# Patient Record
Sex: Female | Born: 1995 | Race: Black or African American | Hispanic: No | Marital: Single | Smoking: Former smoker
Health system: Southern US, Community
[De-identification: ages and names within clinical notes are randomized; demographics above are authoritative.]

## PROBLEM LIST (undated history)

## (undated) ENCOUNTER — Inpatient Hospital Stay (HOSPITAL_COMMUNITY): Payer: Self-pay

## (undated) ENCOUNTER — Emergency Department (HOSPITAL_COMMUNITY): Admission: EM

## (undated) DIAGNOSIS — D649 Anemia, unspecified: Secondary | ICD-10-CM

## (undated) DIAGNOSIS — E611 Iron deficiency: Secondary | ICD-10-CM

## (undated) HISTORY — PX: NO PAST SURGERIES: SHX2092

---
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
## 2014-01-06 ENCOUNTER — Encounter (HOSPITAL_COMMUNITY): Payer: Self-pay | Admitting: *Deleted

## 2014-02-11 ENCOUNTER — Emergency Department (HOSPITAL_COMMUNITY)
Admission: EM | Admit: 2014-02-11 | Discharge: 2014-02-12 | Disposition: A | Discharge status: Home / Self Care | Payer: Medicaid Other | Attending: Emergency Medicine | Admitting: Emergency Medicine

## 2014-02-11 ENCOUNTER — Emergency Department (HOSPITAL_COMMUNITY): Payer: Medicaid Other

## 2014-02-11 ENCOUNTER — Encounter (HOSPITAL_COMMUNITY): Payer: Self-pay

## 2014-02-11 DIAGNOSIS — R109 Unspecified abdominal pain: Secondary | ICD-10-CM | POA: Insufficient documentation

## 2014-02-11 DIAGNOSIS — Z720 Tobacco use: Secondary | ICD-10-CM | POA: Diagnosis not present

## 2014-02-11 DIAGNOSIS — Z3202 Encounter for pregnancy test, result negative: Secondary | ICD-10-CM | POA: Diagnosis not present

## 2014-02-11 DIAGNOSIS — Z7952 Long term (current) use of systemic steroids: Secondary | ICD-10-CM | POA: Diagnosis not present

## 2014-02-11 DIAGNOSIS — Z792 Long term (current) use of antibiotics: Secondary | ICD-10-CM | POA: Diagnosis not present

## 2014-02-11 DIAGNOSIS — Z79899 Other long term (current) drug therapy: Secondary | ICD-10-CM | POA: Diagnosis not present

## 2014-02-11 LAB — COMPREHENSIVE METABOLIC PANEL
ALT: 7 U/L (ref 0–35)
AST: 12 U/L (ref 0–37)
Albumin: 4.2 g/dL (ref 3.5–5.2)
Alkaline Phosphatase: 61 U/L (ref 39–117)
Anion gap: 13 (ref 5–15)
BILIRUBIN TOTAL: 0.3 mg/dL (ref 0.3–1.2)
BUN: 15 mg/dL (ref 6–23)
CHLORIDE: 99 meq/L (ref 96–112)
CO2: 25 meq/L (ref 19–32)
Calcium: 10 mg/dL (ref 8.4–10.5)
Creatinine, Ser: 1.13 mg/dL — ABNORMAL HIGH (ref 0.50–1.10)
GFR calc Af Amer: 81 mL/min — ABNORMAL LOW (ref >90)
GFR, EST NON AFRICAN AMERICAN: 70 mL/min — AB (ref >90)
Glucose, Bld: 96 mg/dL (ref 70–99)
Potassium: 3.9 mEq/L (ref 3.7–5.3)
SODIUM: 137 meq/L (ref 137–147)
Total Protein: 7.7 g/dL (ref 6.0–8.3)

## 2014-02-11 LAB — CBC WITH DIFFERENTIAL/PLATELET
BASOS ABS: 0 10*3/uL (ref 0.0–0.1)
Basophils Relative: 0 % (ref 0–1)
Eosinophils Absolute: 0.1 10*3/uL (ref 0.0–0.7)
Eosinophils Relative: 4 % (ref 0–5)
HEMATOCRIT: 36.7 % (ref 36.0–46.0)
Hemoglobin: 12 g/dL (ref 12.0–15.0)
LYMPHS ABS: 2 10*3/uL (ref 0.7–4.0)
LYMPHS PCT: 52 % — AB (ref 12–46)
MCH: 27.3 pg (ref 26.0–34.0)
MCHC: 32.7 g/dL (ref 30.0–36.0)
MCV: 83.6 fL (ref 78.0–100.0)
MONO ABS: 0.1 10*3/uL (ref 0.1–1.0)
Monocytes Relative: 4 % (ref 3–12)
NEUTROS ABS: 1.5 10*3/uL — AB (ref 1.7–7.7)
Neutrophils Relative %: 40 % — ABNORMAL LOW (ref 43–77)
PLATELETS: 231 10*3/uL (ref 150–400)
RBC: 4.39 MIL/uL (ref 3.87–5.11)
RDW: 12.1 % (ref 11.5–15.5)
WBC: 3.8 10*3/uL — AB (ref 4.0–10.5)

## 2014-02-11 LAB — URINALYSIS, ROUTINE W REFLEX MICROSCOPIC
BILIRUBIN URINE: NEGATIVE
GLUCOSE, UA: NEGATIVE mg/dL
HGB URINE DIPSTICK: NEGATIVE
Ketones, ur: NEGATIVE mg/dL
Leukocytes, UA: NEGATIVE
Nitrite: NEGATIVE
PH: 7 (ref 5.0–8.0)
Protein, ur: NEGATIVE mg/dL
SPECIFIC GRAVITY, URINE: 1.028 (ref 1.005–1.030)
Urobilinogen, UA: 2 mg/dL — ABNORMAL HIGH (ref 0.0–1.0)

## 2014-02-11 LAB — POC URINE PREG, ED: Preg Test, Ur: NEGATIVE

## 2014-02-11 LAB — LIPASE, BLOOD: Lipase: 38 U/L (ref 11–59)

## 2014-02-11 NOTE — ED Notes (Signed)
Pt complains of lower abd pain since September, mom states that she is gaining weight

## 2014-02-11 NOTE — ED Provider Notes (Signed)
CSN: [id number]     Arrival date & time 02/11/14  2117 History   First MD Initiated Contact with Patient 02/11/14 2129     Chief Complaint  Patient presents with  . Abdominal Pain     (Consider location/radiation/quality/duration/timing/severity/associated sxs/prior Treatment) Patient is a 18 y.o. female presenting with abdominal pain. The history is provided by the patient and a relative.  Abdominal Pain Pain location:  Suprapubic Pain quality: aching   Pain radiates to:  Back Pain severity:  Mild Onset quality:  Gradual Duration:  16 weeks Timing:  Sporadic Progression:  Unchanged Chronicity:  New Context: not alcohol use, not awakening from sleep and not trauma   Relieved by:  Nothing Worsened by:  Nothing tried Associated symptoms: no chills, no cough, no fever and no shortness of breath     History reviewed. No pertinent past medical history. History reviewed. No pertinent past surgical history. History reviewed. No pertinent family history. History  Substance Use Topics  . Smoking status: Current Some Day Smoker  . Smokeless tobacco: Never Used  . Alcohol Use: No   OB History    Gravida Para Term Preterm AB TAB SAB Ectopic Multiple Living   1 1 1       1      Review of Systems  Constitutional: Negative for fever and chills.  Respiratory: Negative for cough and shortness of breath.   Gastrointestinal: Positive for abdominal pain.  All other systems reviewed and are negative.     Allergies  Review of patient's allergies indicates no known allergies.  Home Medications   Prior to Admission medications   Medication Sig Start Date End Date Taking? Authorizing Provider  medroxyPROGESTERone (DEPO-PROVERA) 150 MG/ML injection Inject 150 mg into the muscle every 3 (three) months.   Yes Historical Provider, MD  acyclovir (ZOVIRAX) 400 MG tablet Take 1 tablet (400 mg total) by mouth 4 (four) times daily. Patient not taking: Reported on 02/11/2014 06/29/13   Elson AreasLeslie K  Sofia, PA-C  amoxicillin (AMOXIL) 400 MG/5ML suspension 12.5 mL TID Patient not taking: Reported on 02/11/2014 05/01/11   Reuben Likesavid C Keller, MD  cephALEXin (KEFLEX) 500 MG capsule Take 1 capsule (500 mg total) by mouth 3 (three) times daily. Patient not taking: Reported on 02/11/2014 07/09/12   Reuben Likesavid C Keller, MD  hydrOXYzine (ATARAX/VISTARIL) 25 MG tablet Take 1 tablet (25 mg total) by mouth every 6 (six) hours. Patient not taking: Reported on 02/11/2014 07/09/12   Reuben Likesavid C Keller, MD  metroNIDAZOLE (FLAGYL) 500 MG tablet Take 1 tablet (500 mg total) by mouth 2 (two) times daily. Patient not taking: Reported on 02/11/2014 07/03/13   Elson AreasLeslie K Sofia, PA-C  predniSONE (DELTASONE) 20 MG tablet Take 1 tablet (20 mg total) by mouth 2 (two) times daily. Patient not taking: Reported on 02/11/2014 07/09/12   Reuben Likesavid C Keller, MD   BP 117/74 mmHg  Pulse 78  Temp(Src) 97.8 F (36.6 C) (Oral)  Resp 20  Ht 5\' 5"  (1.651 m)  Wt 141 lb (63.957 kg)  BMI 23.46 kg/m2  SpO2 100% Physical Exam  Constitutional: She is oriented to person, place, and time. She appears well-developed and well-nourished. No distress.  HENT:  Head: Normocephalic and atraumatic.  Mouth/Throat: Oropharynx is clear and moist. No oropharyngeal exudate.  Eyes: EOM are normal. Pupils are equal, round, and reactive to light.  Neck: Normal range of motion. Neck supple.  Cardiovascular: Normal rate and regular rhythm.  Exam reveals no friction rub.   No murmur heard.  Pulmonary/Chest: Effort normal and breath sounds normal. No respiratory distress. She has no wheezes. She has no rales.  Abdominal: Soft. She exhibits no distension. There is no tenderness. There is no rebound.  Musculoskeletal: Normal range of motion. She exhibits no edema.  Neurological: She is alert and oriented to person, place, and time.  Skin: No rash noted. She is not diaphoretic.  Nursing note and vitals reviewed.   ED Course  Procedures (including critical care time) Labs  Review Labs Reviewed  URINALYSIS, ROUTINE W REFLEX MICROSCOPIC - Abnormal; Notable for the following:    Urobilinogen, UA 2.0 (*)    All other components within normal limits  CBC WITH DIFFERENTIAL  COMPREHENSIVE METABOLIC PANEL  LIPASE, BLOOD  POC URINE PREG, ED    Imaging Review [place] Transvaginal Non-ob  02/11/2014   CLINICAL DATA:  Abdominal and pelvic pain  EXAM: TRANSABDOMINAL AND TRANSVAGINAL ULTRASOUND OF PELVIS  TECHNIQUE: Both transabdominal and transvaginal ultrasound examinations of the pelvis were performed. Transabdominal technique was performed for global imaging of the pelvis including uterus, ovaries, adnexal regions, and pelvic cul-de-sac. It was necessary to proceed with endovaginal exam following the transabdominal exam to visualize the uterus, endometrium, and ovaries.  COMPARISON:  None  FINDINGS: Uterus  Measurements: 5.9 x 3.4 x 4.4 cm. Heterogeneous myometrial echogenicity without focal mass.  Endometrium  Thickness: 6 mm thick, normal. No endometrial fluid or focal abnormality.  Right ovary  Measurements: 3.0 x 2.4 x 2.1 cm. Normal morphology without mass. Internal blood flow present on color Doppler imaging.  Left ovary  Measurements: 3.4 x 2.1 x 2.4 cm. Normal morphology without mass. Internal blood flow present on color Doppler imaging.  Other findings  No adnexal masses or free pelvic fluid.  IMPRESSION: Normal exam.   Electronically Signed   By: Ulyses SouthwardMark  Boles M.D.   On: 02/11/2014 23:47   [place] Pelvis Complete  02/11/2014   CLINICAL DATA:  Abdominal and pelvic pain  EXAM: TRANSABDOMINAL AND TRANSVAGINAL ULTRASOUND OF PELVIS  TECHNIQUE: Both transabdominal and transvaginal ultrasound examinations of the pelvis were performed. Transabdominal technique was performed for global imaging of the pelvis including uterus, ovaries, adnexal regions, and pelvic cul-de-sac. It was necessary to proceed with endovaginal exam following the transabdominal exam to visualize the uterus,  endometrium, and ovaries.  COMPARISON:  None  FINDINGS: Uterus  Measurements: 5.9 x 3.4 x 4.4 cm. Heterogeneous myometrial echogenicity without focal mass.  Endometrium  Thickness: 6 mm thick, normal. No endometrial fluid or focal abnormality.  Right ovary  Measurements: 3.0 x 2.4 x 2.1 cm. Normal morphology without mass. Internal blood flow present on color Doppler imaging.  Left ovary  Measurements: 3.4 x 2.1 x 2.4 cm. Normal morphology without mass. Internal blood flow present on color Doppler imaging.  Other findings  No adnexal masses or free pelvic fluid.  IMPRESSION: Normal exam.   Electronically Signed   By: Ulyses SouthwardMark  Boles M.D.   On: 02/11/2014 23:47     EKG Interpretation None      MDM   Final diagnoses:  Abdominal pain    12F here with abdominal pain. Not daily, about every other day for a few hours, lower abdomen and in her lower back. No vaginal bleeding or discharge. No urinary symptoms. Last sexual activity about 4 months ago. This began about 4 months ago. Denies any vaginal discharge or history of STD. On exam belly is benign. Lower back without any tenderness. Urine is negative pregnancy is negative. Patient deferred a pelvic refused  to have me perform one. She requested an ultrasound, do not think this is reasonable. Will order to see if she has possible ovarian cyst. On deposition for 3-4 years, has occasional spotting but no full-fledged periods. No correlation between spotting and pain. [place] normal. Stable for discharge.    Elwin Mocha, MD 02/12/14 [phone]

## 2014-02-12 MED ORDER — IBUPROFEN 800 MG PO TABS: 800.0000 mg | ORAL_TABLET | Freq: Three times a day (TID) | ORAL | Status: DC

## 2014-02-12 NOTE — Discharge Instructions (Signed)
Abdominal Pain, Women °Abdominal (stomach, pelvic, or belly) pain can be caused by many things. It is important to tell your doctor: °· The location of the pain. °· Does it come and go or is it present all the time? °· Are there things that start the pain (eating certain foods, exercise)? °· Are there other symptoms associated with the pain (fever, nausea, vomiting, diarrhea)? °All of this is helpful to know when trying to find the cause of the pain. °CAUSES  °· Stomach: virus or bacteria infection, or ulcer. °· Intestine: appendicitis (inflamed appendix), regional ileitis (Crohn's disease), ulcerative colitis (inflamed colon), irritable bowel syndrome, diverticulitis (inflamed diverticulum of the colon), or cancer of the stomach or intestine. °· Gallbladder disease or stones in the gallbladder. °· Kidney disease, kidney stones, or infection. °· Pancreas infection or cancer. °· Fibromyalgia (pain disorder). °· Diseases of the female organs: °¨ Uterus: fibroid (non-cancerous) tumors or infection. °¨ Fallopian tubes: infection or tubal pregnancy. °¨ Ovary: cysts or tumors. °¨ Pelvic adhesions (scar tissue). °¨ Endometriosis (uterus lining tissue growing in the pelvis and on the pelvic organs). °¨ Pelvic congestion syndrome (female organs filling up with blood just before the menstrual period). °¨ Pain with the menstrual period. °¨ Pain with ovulation (producing an egg). °¨ Pain with an IUD (intrauterine device, birth control) in the uterus. °¨ Cancer of the female organs. °· Functional pain (pain not caused by a disease, may improve without treatment). °· Psychological pain. °· Depression. °DIAGNOSIS  °Your doctor will decide the seriousness of your pain by doing an examination. °· Blood tests. °· X-rays. °· Ultrasound. °· CT scan (computed tomography, special type of X-ray). °· MRI (magnetic resonance imaging). °· Cultures, for infection. °· Barium enema (dye inserted in the large intestine, to better view it with  X-rays). °· Colonoscopy (looking in intestine with a lighted tube). °· Laparoscopy (minor surgery, looking in abdomen with a lighted tube). °· Major abdominal exploratory surgery (looking in abdomen with a large incision). °TREATMENT  °The treatment will depend on the cause of the pain.  °· Many cases can be observed and treated at home. °· Over-the-counter medicines recommended by your caregiver. °· Prescription medicine. °· Antibiotics, for infection. °· Birth control pills, for painful periods or for ovulation pain. °· Hormone treatment, for endometriosis. °· Nerve blocking injections. °· Physical therapy. °· Antidepressants. °· Counseling with a psychologist or psychiatrist. °· Minor or major surgery. °HOME CARE INSTRUCTIONS  °· Do not take laxatives, unless directed by your caregiver. °· Take over-the-counter pain medicine only if ordered by your caregiver. Do not take aspirin because it can cause an upset stomach or bleeding. °· Try a clear liquid diet (broth or water) as ordered by your caregiver. Slowly move to a bland diet, as tolerated, if the pain is related to the stomach or intestine. °· Have a thermometer and take your temperature several times a day, and record it. °· Bed rest and sleep, if it helps the pain. °· Avoid sexual intercourse, if it causes pain. °· Avoid stressful situations. °· Keep your follow-up appointments and tests, as your caregiver orders. °· If the pain does not go away with medicine or surgery, you may try: °¨ Acupuncture. °¨ Relaxation exercises (yoga, meditation). °¨ Group therapy. °¨ Counseling. °SEEK MEDICAL CARE IF:  °· You notice certain foods cause stomach pain. °· Your home care treatment is not helping your pain. °· You need stronger pain medicine. °· You want your IUD removed. °· You feel faint or   lightheaded. °· You develop nausea and vomiting. °· You develop a rash. °· You are having side effects or an allergy to your medicine. °SEEK IMMEDIATE MEDICAL CARE IF:  °· Your  pain does not go away or gets worse. °· You have a fever. °· Your pain is felt only in portions of the abdomen. The right side could possibly be appendicitis. The left lower portion of the abdomen could be colitis or diverticulitis. °· You are passing blood in your stools (bright red or black tarry stools, with or without vomiting). °· You have blood in your urine. °· You develop chills, with or without a fever. °· You pass out. °MAKE SURE YOU:  °· Understand these instructions. °· Will watch your condition. °· Will get help right away if you are not doing well or get worse. °Document Released: 12/19/2006 Document Revised: 07/08/2013 Document Reviewed: 01/08/2009 °[employer]® Patient Information ©2015 [employer]. This information is not intended to replace advice given to you by your health care provider. Make sure you discuss any questions you have with your health care provider. ° °

## 2014-03-27 ENCOUNTER — Emergency Department (HOSPITAL_COMMUNITY)
Admission: EM | Admit: 2014-03-27 | Discharge: 2014-03-27 | Disposition: A | Discharge status: Home / Self Care | Payer: Medicaid Other | Attending: Emergency Medicine | Admitting: Emergency Medicine

## 2014-03-27 ENCOUNTER — Encounter (HOSPITAL_COMMUNITY): Payer: Self-pay | Admitting: *Deleted

## 2014-03-27 DIAGNOSIS — K068 Other specified disorders of gingiva and edentulous alveolar ridge: Secondary | ICD-10-CM

## 2014-03-27 DIAGNOSIS — Z3202 Encounter for pregnancy test, result negative: Secondary | ICD-10-CM | POA: Insufficient documentation

## 2014-03-27 DIAGNOSIS — N72 Inflammatory disease of cervix uteri: Secondary | ICD-10-CM

## 2014-03-27 DIAGNOSIS — Z79899 Other long term (current) drug therapy: Secondary | ICD-10-CM | POA: Insufficient documentation

## 2014-03-27 DIAGNOSIS — K088 Other specified disorders of teeth and supporting structures: Secondary | ICD-10-CM | POA: Diagnosis not present

## 2014-03-27 DIAGNOSIS — R102 Pelvic and perineal pain: Secondary | ICD-10-CM | POA: Insufficient documentation

## 2014-03-27 DIAGNOSIS — Z7952 Long term (current) use of systemic steroids: Secondary | ICD-10-CM | POA: Diagnosis not present

## 2014-03-27 DIAGNOSIS — Z792 Long term (current) use of antibiotics: Secondary | ICD-10-CM | POA: Insufficient documentation

## 2014-03-27 LAB — URINALYSIS, ROUTINE W REFLEX MICROSCOPIC
Bilirubin Urine: NEGATIVE
GLUCOSE, UA: NEGATIVE mg/dL
HGB URINE DIPSTICK: NEGATIVE
KETONES UR: NEGATIVE mg/dL
Leukocytes, UA: NEGATIVE
Nitrite: NEGATIVE
PROTEIN: NEGATIVE mg/dL
Specific Gravity, Urine: 1.035 — ABNORMAL HIGH (ref 1.005–1.030)
UROBILINOGEN UA: 1 mg/dL (ref 0.0–1.0)
pH: 6 (ref 5.0–8.0)

## 2014-03-27 LAB — WET PREP, GENITAL
CLUE CELLS WET PREP: NONE SEEN
Trich, Wet Prep: NONE SEEN
YEAST WET PREP: NONE SEEN

## 2014-03-27 LAB — PREGNANCY, URINE: PREG TEST UR: NEGATIVE

## 2014-03-27 MED ORDER — CEFTRIAXONE SODIUM 250 MG IJ SOLR
250.0000 mg | Freq: Once | INTRAMUSCULAR | Status: AC
  Administered 2014-03-27: 250 mg via INTRAMUSCULAR
  Filled 2014-03-27: qty 250

## 2014-03-27 MED ORDER — IBUPROFEN 400 MG PO TABS
600.0000 mg | ORAL_TABLET | Freq: Once | ORAL | Status: AC
  Administered 2014-03-27: 600 mg via ORAL
  Filled 2014-03-27 (×2): qty 1

## 2014-03-27 MED ORDER — LIDOCAINE HCL (PF) 1 % IJ SOLN
INTRAMUSCULAR | Status: AC
  Administered 2014-03-27: 5 mL
  Filled 2014-03-27: qty 5

## 2014-03-27 MED ORDER — AZITHROMYCIN 250 MG PO TABS
1000.0000 mg | ORAL_TABLET | Freq: Once | ORAL | Status: AC
  Administered 2014-03-27: 1000 mg via ORAL
  Filled 2014-03-27: qty 4

## 2014-03-27 NOTE — ED Notes (Signed)
Pt in c/o gum swelling and pain, also concerned she may be pregnant, no distress noted

## 2014-03-27 NOTE — Discharge Instructions (Signed)
Take ibuprofen or tylenol for pain. Follow up with your doctor or ob/gyn regarding your pelvic pain. Listerine for gum pain several times a day.   Pelvic Pain Female pelvic pain can be caused by many different things and start from a variety of places. Pelvic pain refers to pain that is located in the lower half of the abdomen and between your hips. The pain may occur over a short period of time (acute) or may be reoccurring (chronic). The cause of pelvic pain may be related to disorders affecting the female reproductive organs (gynecologic), but it may also be related to the bladder, kidney stones, an intestinal complication, or muscle or skeletal problems. Getting help right away for pelvic pain is important, especially if there has been severe, sharp, or a sudden onset of unusual pain. It is also important to get help right away because some types of pelvic pain can be life threatening.  CAUSES  Below are only some of the causes of pelvic pain. The causes of pelvic pain can be in one of several categories.   Gynecologic.  Pelvic inflammatory disease.  Sexually transmitted infection.  Ovarian cyst or a twisted ovarian ligament (ovarian torsion).  Uterine lining that grows outside the uterus (endometriosis).  Fibroids, cysts, or tumors.  Ovulation.  Pregnancy.  Pregnancy that occurs outside the uterus (ectopic pregnancy).  Miscarriage.  Labor.  Abruption of the placenta or ruptured uterus.  Infection.  Uterine infection (endometritis).  Bladder infection.  Diverticulitis.  Miscarriage related to a uterine infection (septic abortion).  Bladder.  Inflammation of the bladder (cystitis).  Kidney stone(s).  Gastrointestinal.  Constipation.  Diverticulitis.  Neurologic.  Trauma.  Feeling pelvic pain because of mental or emotional causes (psychosomatic).  Cancers of the bowel or pelvis. EVALUATION  Your caregiver will want to take a careful history of your  concerns. This includes recent changes in your health, a careful gynecologic history of your periods (menses), and a sexual history. Obtaining your family history and medical history is also important. Your caregiver may suggest a pelvic exam. A pelvic exam will help identify the location and severity of the pain. It also helps in the evaluation of which organ system may be involved. In order to identify the cause of the pelvic pain and be properly treated, your caregiver may order tests. These tests may include:   A pregnancy test.  Pelvic ultrasonography.  An X-ray exam of the abdomen.  A urinalysis or evaluation of vaginal discharge.  Blood tests. HOME CARE INSTRUCTIONS   Only take over-the-counter or prescription medicines for pain, discomfort, or fever as directed by your caregiver.   Rest as directed by your caregiver.   Eat a balanced diet.   Drink enough fluids to make your urine clear or pale yellow, or as directed.   Avoid sexual intercourse if it causes pain.   Apply warm or cold compresses to the lower abdomen depending on which one helps the pain.   Avoid stressful situations.   Keep a journal of your pelvic pain. Write down when it started, where the pain is located, and if there are things that seem to be associated with the pain, such as food or your menstrual cycle.  Follow up with your caregiver as directed.  SEEK MEDICAL CARE IF:  Your medicine does not help your pain.  You have abnormal vaginal discharge. SEEK IMMEDIATE MEDICAL CARE IF:   You have heavy bleeding from the vagina.   Your pelvic pain increases.   You  feel light-headed or faint.   You have chills.   You have pain with urination or blood in your urine.   You have uncontrolled diarrhea or vomiting.   You have a fever or persistent symptoms for more than 3 days.  You have a fever and your symptoms suddenly get worse.   You are being physically or sexually abused.   MAKE SURE YOU:  Understand these instructions.  Will watch your condition.  Will get help if you are not doing well or get worse. Document Released: 01/19/2004 Document Revised: 07/08/2013 Document Reviewed: 06/13/2011 [facility] Patient Information 2015 [place], [place]. This information is not intended to replace advice given to you by your health care provider. Make sure you discuss any questions you have with your health care provider.

## 2014-03-27 NOTE — ED Provider Notes (Signed)
CSN: [id number]     Arrival date &  time 03/27/14  1409 History   First MD Initiated Contact with Patient 03/27/14 1515     Chief Complaint  Patient presents with  . Dental Pain  . Possible Pregnancy     (Consider location/radiation/quality/duration/timing/severity/associated sxs/prior Treatment) HPI Christy Gonzalez is a 19 y.o. female presents to ED with complaint of gum swelling and lower abdominal pain. Patient states gum swelling started several days ago. States she has a cap on that tooth. Denies any pain in that tooth, no pain with eating or biting. No fever, chills, facial swelling. No swelling under the tongue. Patient is also complaining of lower abdominal pain for 3 months. She states when pain started she was seen in emergency department and by her doctor. She was not given a diagnosis. She states she thought she may do been pregnant, but states her pregnancy test that time was negative. She did not take pregnancy test at home prior to coming in. She reports some abnormal vaginal discharge, states white. Denies bleeding. Denies any nausea, vomiting, changes in bowels. No medications prior to coming in.  History reviewed. No pertinent past medical history. History reviewed. No pertinent past surgical history. History reviewed. No pertinent family history. History  Substance Use Topics  . Smoking status: Current Some Day Smoker  . Smokeless tobacco: Never Used  . Alcohol Use: No   OB History    Gravida Para Term Preterm AB TAB SAB Ectopic Multiple Living   Review of Systems  Constitutional: Negative for fever and chills.  HENT: Positive for dental problem.   Respiratory: Negative for cough, chest tightness and shortness of breath.   Cardiovascular: Negative for chest pain, palpitations and leg swelling.  Gastrointestinal: Positive for abdominal pain. Negative for nausea, vomiting and diarrhea.  Genitourinary: Positive for vaginal discharge and pelvic  pain. Negative for dysuria, flank pain, vaginal bleeding and vaginal pain.  Musculoskeletal: Negative for myalgias, arthralgias, neck pain and neck stiffness.  Skin: Negative for rash.  Neurological: Negative for dizziness, weakness and headaches.  All other systems reviewed and are negative.     Allergies  Review of patient's allergies indicates no known allergies.  Home Medications   Prior to Admission medications   Medication Sig Start Date End Date Taking? Authorizing Provider  ibuprofen (ADVIL,MOTRIN) 800 MG tablet Take 1 tablet (800 mg total) by mouth 3 (three) times daily. 02/12/14  Yes Elwin Mocha, MD  medroxyPROGESTERone (DEPO-PROVERA) 150 MG/ML injection Inject 150 mg into the muscle every 3 (three) months.   Yes Historical Provider, MD  acyclovir (ZOVIRAX) 400 MG tablet Take 1 tablet (400 mg total) by mouth 4 (four) times daily. Patient not taking: Reported on 02/11/2014 06/29/13   Elson Areas, PA-C  amoxicillin (AMOXIL) 400 MG/5ML suspension 12.5 mL TID Patient not taking: Reported on 02/11/2014 05/01/11   Reuben Likes, MD  cephALEXin (KEFLEX) 500 MG capsule Take 1 capsule (500 mg total) by mouth 3 (three) times daily. Patient not taking: Reported on 02/11/2014 07/09/12   Reuben Likes, MD  hydrOXYzine (ATARAX/VISTARIL) 25 MG tablet Take 1 tablet (25 mg total) by mouth every 6 (six) hours. Patient not taking: Reported on 02/11/2014 07/09/12   Reuben Likes, MD  metroNIDAZOLE (FLAGYL) 500 MG tablet Take 1 tablet (500 mg total) by mouth 2 (two) times daily. Patient not taking: Reported on 02/11/2014 07/03/13   Lonia Skinner  Sofia, PA-C  predniSONE (DELTASONE) 20 MG tablet Take 1 tablet (20 mg total) by mouth 2 (two) times daily. Patient not taking: Reported on 02/11/2014 07/09/12   Reuben Likesavid C Keller, MD   BP 135/72 mmHg  Pulse 79  Temp(Src) 98 F (36.7 C) (Oral)  Resp 20  SpO2 100%  LMP  (LMP Unknown) Physical Exam  Constitutional: She appears well-developed and well-nourished. No  distress.  HENT:  Head: Normocephalic.  Dental crown noted on the left lower first molar. There is no obvious gum swelling around the tooth. There is no tenderness to palpation.  Eyes: Conjunctivae are normal.  Neck: Neck supple.  Cardiovascular: Normal rate, regular rhythm and normal heart sounds.   Pulmonary/Chest: Effort normal and breath sounds normal. No respiratory distress. She has no wheezes. She has no rales.  Abdominal: Soft. Bowel sounds are normal. She exhibits no distension. There is no tenderness. There is no rebound and no guarding.  Genitourinary:  Normal external genitalia. Normal vaginal canal. Small thin white discharge. Cervix is erythematous, friable. closed. + CMT. No uterine or adnexal tenderness. No masses palpated.    Musculoskeletal: She exhibits no edema.  Neurological: She is alert.  Skin: Skin is warm and dry.  Psychiatric: She has a normal mood and affect. Her behavior is normal.  Nursing note and vitals reviewed.   ED Course  Procedures (including critical care time) Labs Review Labs Reviewed  WET PREP, GENITAL  URINALYSIS, ROUTINE W REFLEX MICROSCOPIC  PREGNANCY, URINE  GC/CHLAMYDIA PROBE AMP ([place])    Imaging Review No results found.   EKG Interpretation None      MDM   Final diagnoses:  None    Patient emergency department complaining of lower abdominal pain for 4 months, states she believes she is pregnant despite multiple pregnancy tests by her doctor and last month and 3 months ago here. She states she is also having some breast swelling and tenderness. She is on Depo shot. Will get pelvic exam, ua, preg test.    4:58 PM Urine pregnancy is negative. UA unremarkable. Pelvic exam showing moderate white blood cells on the wet prep, suspect given her erythematous and friable cervix with positive cervical motion tenderness that she may have cervicitis. Her abdominal exam is benign  otherwise. Will treat with Rocephin and Zithromax  in the emergency department.  when I discussed discharge instructions and plan with patient, she became very upset stating that we did not figure was going on with her stomach. She also explained to me that she strongly believes that she is pregnant. I do not think that she needs any further imaging at this time. I do not think she has a surgical abdomen, there is no guarding or even tenderness over abdominal area. Her pain has been there for 4 months. Her symptoms could be related to intestinal cramping, ovarian cysts, pelvic pain. I discussed with her that she needs to follow with her primary care doctor or OB/GYN specialist for further evaluation and treatment. Motrin given for pain,.   Filed Vitals:   03/27/14 1414  BP: 135/72  Pulse: 79  Temp: 98 F (36.7 C)  TempSrc: Oral  Resp: 20  SpO2: 100%       Myriam Jacobsonatyana A Beautifull Cisar, PA-C 03/27/14 1720  Samuel JesterKathleen McManus, DO 03/30/14 1332

## 2014-03-28 LAB — GC/CHLAMYDIA PROBE AMP (~~LOC~~) NOT AT ARMC
CHLAMYDIA, DNA PROBE: NEGATIVE
NEISSERIA GONORRHEA: NEGATIVE

## 2014-05-22 ENCOUNTER — Encounter (HOSPITAL_COMMUNITY): Payer: Self-pay | Admitting: Emergency Medicine

## 2014-05-22 ENCOUNTER — Emergency Department (HOSPITAL_COMMUNITY)
Admission: EM | Admit: 2014-05-22 | Discharge: 2014-05-22 | Disposition: A | Discharge status: Home / Self Care | Payer: Medicaid Other | Attending: Emergency Medicine | Admitting: Emergency Medicine

## 2014-05-22 DIAGNOSIS — M545 Low back pain, unspecified: Secondary | ICD-10-CM

## 2014-05-22 DIAGNOSIS — Y92410 Unspecified street and highway as the place of occurrence of the external cause: Secondary | ICD-10-CM | POA: Insufficient documentation

## 2014-05-22 DIAGNOSIS — S3992XA Unspecified injury of lower back, initial encounter: Secondary | ICD-10-CM | POA: Insufficient documentation

## 2014-05-22 DIAGNOSIS — Y9389 Activity, other specified: Secondary | ICD-10-CM | POA: Insufficient documentation

## 2014-05-22 DIAGNOSIS — Z3202 Encounter for pregnancy test, result negative: Secondary | ICD-10-CM | POA: Insufficient documentation

## 2014-05-22 DIAGNOSIS — Y998 Other external cause status: Secondary | ICD-10-CM | POA: Diagnosis not present

## 2014-05-22 DIAGNOSIS — Z720 Tobacco use: Secondary | ICD-10-CM | POA: Insufficient documentation

## 2014-05-22 LAB — POC URINE PREG, ED: PREG TEST UR: NEGATIVE

## 2014-05-22 MED ORDER — ACETAMINOPHEN 325 MG PO TABS
650.0000 mg | ORAL_TABLET | Freq: Once | ORAL | Status: AC
  Administered 2014-05-22: 650 mg via ORAL
  Filled 2014-05-22: qty 2

## 2014-05-22 MED ORDER — NAPROXEN 500 MG PO TABS: 500.0000 mg | ORAL_TABLET | Freq: Two times a day (BID) | ORAL | Status: DC
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
## 2014-05-22 NOTE — Discharge Instructions (Signed)
Back Pain, Adult °Low back pain is very common. About 1 in 5 people have back pain. The cause of low back pain is rarely dangerous. The pain often gets better over time. About half of people with a sudden onset of back pain feel better in just 2 weeks. About 8 in 10 people feel better by 6 weeks.  °CAUSES °Some common causes of back pain include: °· Strain of the muscles or ligaments supporting the spine. °· Wear and tear (degeneration) of the spinal discs. °· Arthritis. °· Direct injury to the back. °DIAGNOSIS °Most of the time, the direct cause of low back pain is not known. However, back pain can be treated effectively even when the exact cause of the pain is unknown. Answering your caregiver's questions about your overall health and symptoms is one of the most accurate ways to make sure the cause of your pain is not dangerous. If your caregiver needs more information, he or she may order lab work or imaging tests (X-rays or MRIs). However, even if imaging tests show changes in your back, this usually does not require surgery. °HOME CARE INSTRUCTIONS °For many people, back pain returns. Since low back pain is rarely dangerous, it is often a condition that people can learn to manage on their own.  °· Remain active. It is stressful on the back to sit or stand in one place. Do not sit, drive, or stand in one place for more than 30 minutes at a time. Take short walks on level surfaces as soon as pain allows. Try to increase the length of time you walk each day. °· Do not stay in bed. Resting more than 1 or 2 days can delay your recovery. °· Do not avoid exercise or work. Your body is made to move. It is not dangerous to be active, even though your back may hurt. Your back will likely heal faster if you return to being active before your pain is gone. °· Pay attention to your body when you  bend and lift. Many people have less discomfort when lifting if they bend their knees, keep the load close to their bodies, and  avoid twisting. Often, the most comfortable positions are those that put less stress on your recovering back. °· Find a comfortable position to sleep. Use a firm mattress and lie on your side with your knees slightly bent. If you lie on your back, put a pillow under your knees. °· Only take over-the-counter or prescription medicines as directed by your caregiver. Over-the-counter medicines to reduce pain and inflammation are often the most helpful. Your caregiver may prescribe muscle relaxant drugs. These medicines help dull your pain so you can more quickly return to your normal activities and healthy exercise. °· Put ice on the injured area. °· Put ice in a plastic bag. °· Place a towel between your skin and the bag. °· Leave the ice on for 15-20 minutes, 03-04 times a day for the first 2 to 3 days. After that, ice and heat may be alternated to reduce pain and spasms. °· Ask your caregiver about trying back exercises and gentle massage. This may be of some benefit. °· Avoid feeling anxious or stressed. Stress increases muscle tension and can worsen back pain. It is important to recognize when you are anxious or stressed and learn ways to manage it. Exercise is a great option. °SEEK MEDICAL CARE IF: °· You have pain that is not relieved with rest or medicine. °· You have pain that does not improve in 1 week. °· You have new symptoms. °· You are generally not feeling well. °SEEK   IMMEDIATE MEDICAL CARE IF:  °· You have pain that radiates from your back into your legs. °· You develop new bowel or bladder control problems. °· You have unusual weakness or numbness in your arms or legs. °· You develop nausea or vomiting. °· You develop abdominal pain. °· You feel faint. °Document Released: 02/21/2005 Document Revised: 08/23/2011 Document Reviewed: 06/25/2013 °Exit[employer]® Patient Information ©2015 [employer]. This information is not intended to replace advice given to you by your health care provider. Make sure you  discuss any questions you have with your health care provider. ° °Back Exercises °Back exercises help treat and prevent back injuries. The goal of back exercises is to increase the strength of your abdominal and back muscles and the flexibility of your back. These exercises should be started when you no longer have back pain. Back exercises include: °· Pelvic Tilt. Lie on your back with your knees bent. Tilt your pelvis until the lower part of your back is against the floor. Hold this position 5 to 10 sec and repeat 5 to 10 times. °· Knee to Chest. Pull first 1 knee up against your chest and hold for 20 to 30 seconds, repeat this with the other knee, and then both knees. This may be done with the other leg straight or bent, whichever feels better. °· Sit-Ups or Curl-Ups. Bend your knees 90 degrees. Start with tilting your pelvis, and do a partial, slow sit-up, lifting your trunk only 30 to 45 degrees off the floor. Take at least 2 to 3 seconds for each sit-up. Do not do sit-ups with your knees out straight. If partial sit-ups are difficult, simply do the above but with only tightening your abdominal muscles and holding it as directed. °· Hip-Lift. Lie on your back with your knees flexed 90 degrees. Push down with your feet and shoulders as you raise your hips a couple inches off the floor; hold for 10 seconds, repeat 5 to 10 times. °· Back arches. Lie on your stomach, propping yourself up on bent elbows. Slowly press on your hands, causing an arch in your low back. Repeat 3 to 5 times. Any initial stiffness and discomfort should lessen with repetition over time. °· Shoulder-Lifts. Lie face down with arms beside your body. Keep hips and torso pressed to floor as you slowly lift your head and shoulders off the floor. °Do not overdo your exercises, especially in the beginning. Exercises may cause you some mild back discomfort which lasts for a few minutes; however, if the pain is more severe, or lasts for more than 15  minutes, do not continue exercises until you see your caregiver. Improvement with exercise therapy for back problems is slow.  °See your caregivers for assistance with developing a proper back exercise program. °Document Released: 03/31/2004 Document Revised: 05/16/2011 Document Reviewed: 12/23/2010 °ExitCare® Patient Information ©2015 [employer]. This information is not intended to replace advice given to you by your health care provider. Make sure you discuss any questions you have with your health care provider. °Motor Vehicle Collision °It is common to have multiple bruises and sore muscles after a motor vehicle collision (MVC). These tend to feel worse for the first 24 hours. You may have the most stiffness and soreness over the first several hours. You may also feel worse when you wake up the first morning after your collision. After this point, you will usually begin to improve with each day. The speed of improvement often depends on the severity of the collision,   the number of injuries, and the location and nature of these injuries. °HOME CARE INSTRUCTIONS °· Put ice on the injured area. °¨ Put ice in a plastic bag. °¨ Place a towel between your skin and the bag. °¨ Leave the ice on for 15-20 minutes, 3-4 times a day, or as directed by your health care provider. °· Drink enough fluids to keep your urine clear or pale yellow. Do not drink alcohol. °· Take a warm shower or bath once or twice a day. This will increase blood flow to sore muscles. °· You may return to activities as directed by your caregiver. Be careful when lifting, as this may aggravate neck or back pain. °· Only take over-the-counter or prescription medicines for pain, discomfort, or fever as directed by your caregiver. Do not use aspirin. This may increase bruising and bleeding. °SEEK IMMEDIATE MEDICAL CARE IF: °· You have numbness, tingling, or weakness in the arms or legs. °· You develop severe headaches not relieved with  medicine. °· You have severe neck pain, especially tenderness in the middle of the back of your neck. °· You have changes in bowel or bladder control. °· There is increasing pain in any area of the body. °· You have shortness of breath, light-headedness, dizziness, or fainting. °· You have chest pain. °· You feel sick to your stomach (nauseous), throw up (vomit), or sweat. °· You have increasing abdominal discomfort. °· There is blood in your urine, stool, or vomit. °· You have pain in your shoulder (shoulder strap areas). °· You feel your symptoms are getting worse. °MAKE SURE YOU: °· Understand these instructions. °· Will watch your condition. °· Will get help right away if you are not doing well or get worse. °Document Released: 02/21/2005 Document Revised: 07/08/2013 Document Reviewed: 07/21/2010 °ExitCare® Patient Information ©2015 [employer]. This information is not intended to replace advice given to you by your health care provider. Make sure you discuss any questions you have with your health care provider. ° °

## 2014-10-15 ENCOUNTER — Encounter (HOSPITAL_COMMUNITY): Payer: Self-pay | Admitting: *Deleted

## 2014-10-15 ENCOUNTER — Inpatient Hospital Stay (HOSPITAL_COMMUNITY): Payer: Medicaid Other

## 2014-10-15 ENCOUNTER — Inpatient Hospital Stay (HOSPITAL_COMMUNITY)
Admission: AD | Admit: 2014-10-15 | Discharge: 2014-10-15 | Disposition: A | Discharge status: Home / Self Care | Payer: Self-pay | Source: Ambulatory Visit | Attending: Obstetrics & Gynecology | Admitting: Obstetrics & Gynecology

## 2014-10-15 DIAGNOSIS — R109 Unspecified abdominal pain: Secondary | ICD-10-CM | POA: Insufficient documentation

## 2014-10-15 DIAGNOSIS — Z3A08 8 weeks gestation of pregnancy: Secondary | ICD-10-CM | POA: Insufficient documentation

## 2014-10-15 DIAGNOSIS — O26899 Other specified pregnancy related conditions, unspecified trimester: Secondary | ICD-10-CM

## 2014-10-15 DIAGNOSIS — O99331 Smoking (tobacco) complicating pregnancy, first trimester: Secondary | ICD-10-CM | POA: Insufficient documentation

## 2014-10-15 DIAGNOSIS — Z886 Allergy status to analgesic agent status: Secondary | ICD-10-CM | POA: Insufficient documentation

## 2014-10-15 DIAGNOSIS — O219 Vomiting of pregnancy, unspecified: Secondary | ICD-10-CM

## 2014-10-15 DIAGNOSIS — O210 Mild hyperemesis gravidarum: Secondary | ICD-10-CM | POA: Insufficient documentation

## 2014-10-15 LAB — URINALYSIS, ROUTINE W REFLEX MICROSCOPIC
Glucose, UA: NEGATIVE mg/dL
Hgb urine dipstick: NEGATIVE
LEUKOCYTES UA: NEGATIVE
Nitrite: NEGATIVE
PH: 5.5 (ref 5.0–8.0)
Protein, ur: NEGATIVE mg/dL
SPECIFIC GRAVITY, URINE: 1.025 (ref 1.005–1.030)
Urobilinogen, UA: 1 mg/dL (ref 0.0–1.0)

## 2014-10-15 LAB — CBC
HEMATOCRIT: 32.4 % — AB (ref 36.0–46.0)
Hemoglobin: 11.2 g/dL — ABNORMAL LOW (ref 12.0–15.0)
MCH: 28.2 pg (ref 26.0–34.0)
MCHC: 34.6 g/dL (ref 30.0–36.0)
MCV: 81.6 fL (ref 78.0–100.0)
Platelets: 232 10*3/uL (ref 150–400)
RBC: 3.97 MIL/uL (ref 3.87–5.11)
RDW: 12.3 % (ref 11.5–15.5)
WBC: 4.1 10*3/uL (ref 4.0–10.5)

## 2014-10-15 LAB — WET PREP, GENITAL
TRICH WET PREP: NONE SEEN
Yeast Wet Prep HPF POC: NONE SEEN

## 2014-10-15 LAB — HCG, QUANTITATIVE, PREGNANCY: HCG, BETA CHAIN, QUANT, S: 146190 m[IU]/mL — AB (ref <5)

## 2014-10-15 LAB — OB RESULTS CONSOLE GC/CHLAMYDIA: Gonorrhea: NEGATIVE

## 2014-10-15 LAB — POCT PREGNANCY, URINE: PREG TEST UR: POSITIVE — AB

## 2014-10-15 LAB — ABO/RH: ABO/RH(D): O POS

## 2014-10-15 MED ORDER — PROMETHAZINE HCL 25 MG PO TABS: 12.5000 mg | ORAL_TABLET | Freq: Four times a day (QID) | ORAL | Status: DC | PRN

## 2014-10-15 MED ORDER — PROMETHAZINE HCL 25 MG PO TABS
25.0000 mg | ORAL_TABLET | Freq: Once | ORAL | Status: AC
  Administered 2014-10-15: 25 mg via ORAL
  Filled 2014-10-15: qty 1

## 2014-10-15 NOTE — MAU Note (Signed)
Pt reports she has had abd pain and vomiting (vomited 4 times in 2 weeks).

## 2014-10-15 NOTE — Discharge Instructions (Signed)
[facility]    [facility]  [phone]     Phone: [phone]          [facility]                      [facility]   [place]     Phone: [phone]  Phone: [phone]         [facility]     Phone: [phone]  Phone: [phone]           [facility] [place]                hone: [phone]  Phone: [phone]         [facility] [address]      Phone: [phone]  Phone: [phone]         [facility]                Phone: [phone]  [facility]   [phone]    [facility] [address] [place])          Phone: [phone] [facility]   Phone: [phone]  Morning Sickness Morning sickness is when you feel sick to your stomach (nauseous) during pregnancy. This nauseous feeling may or may not come with vomiting. It often occurs in the morning but can be a problem any time of day. Morning sickness is most common during the first trimester, but it may continue throughout pregnancy. While morning sickness is unpleasant, it is usually harmless unless you develop severe and continual vomiting (hyperemesis gravidarum). This condition requires more intense treatment.  CAUSES  The cause of morning sickness is not completely known but seems to be related to normal hormonal changes that occur in pregnancy. RISK FACTORS You are at greater risk if you:  Experienced nausea or vomiting before your pregnancy.  Had morning sickness during a previous pregnancy.  Are pregnant with more than one baby, such as twins. TREATMENT  Do not use any medicines (prescription, over-the-counter, or herbal) for morning sickness without first talking to your health care provider. Your health care provider may prescribe or recommend:  Vitamin B6  supplements.  Anti-nausea medicines.  The herbal medicine ginger. HOME CARE INSTRUCTIONS   Only take over-the-counter or prescription medicines as directed by your health care provider.  Taking multivitamins before getting pregnant can prevent or decrease the severity of morning sickness in most women.  Eat a piece of dry toast or unsalted crackers before getting out of bed in the morning.  Eat five or six small meals a day.  Eat dry and bland foods (rice, baked potato). Foods high in carbohydrates are often helpful.  Do not drink liquids with your meals. Drink liquids between meals.  Avoid greasy, fatty, and spicy foods.  Get someone to cook for you if the smell of any food causes nausea and vomiting.  If you feel nauseous after taking prenatal vitamins, take the vitamins at night or with a snack.  Snack on protein foods (nuts, yogurt, cheese) between meals if you are hungry.  Eat unsweetened gelatins for desserts.  Wearing an acupressure wristband (worn for sea sickness) may be helpful.  Acupuncture may be helpful.  Do not smoke.  Get a humidifier to keep the air in your house free of odors.  Get  plenty of fresh air. SEEK MEDICAL CARE IF:   Your home remedies are not working, and you need medicine.  You feel dizzy or lightheaded.  You are losing weight. SEEK IMMEDIATE MEDICAL CARE IF:   You have persistent and uncontrolled nausea and vomiting.  You pass out (faint). MAKE SURE YOU:  Understand these instructions.  Will watch your condition.  Will get help right away if you are not doing well or get worse. Document Released: 04/14/2006 Document Revised: 02/26/2013 Document Reviewed: 08/08/2012 [facility] Patient Information 2015 [place], [place]. This information is not intended to replace advice given to you by your health care provider. Make sure you discuss any questions you have with your health care provider.

## 2014-10-15 NOTE — MAU Provider Note (Signed)
History     CSN: [id number]  Arrival date and time: 10/15/14 1815   First Provider Initiated Contact with Patient 10/15/14 2034      Chief Complaint  Patient presents with  . Emesis  . Abdominal Pain   HPI Comments: LMP was sometime in June. Patient is unsure of exact date. She has had abdominal pain and nausea for about two weeks. She denies any vaginal bleeding.   Emesis  This is a new problem. The current episode started 1 to 4 weeks ago. The problem occurs less than 2 times per day. The emesis has an appearance of stomach contents. There has been no fever. Associated symptoms include abdominal pain. Pertinent negatives include no diarrhea or fever. Risk factors: pregnancy  She has tried nothing for the symptoms. The treatment provided no relief.  Abdominal Pain This is a new problem. The current episode started 1 to 4 weeks ago. The onset quality is gradual. The problem occurs constantly. The problem has been unchanged. The pain is located in the suprapubic region. The pain is at a severity of 2/10. The quality of the pain is cramping. The abdominal pain does not radiate. Associated symptoms include nausea and vomiting. Pertinent negatives include no constipation, diarrhea, dysuria, fever or frequency. Nothing aggravates the pain. The pain is relieved by nothing. She has tried nothing for the symptoms.    History reviewed. No pertinent past medical history.  History reviewed. No pertinent past surgical history.  Family History  Problem Relation Age of Onset  . Hypertension Mother     Social History  Substance Use Topics  . Smoking status: Current Some Day Smoker  . Smokeless tobacco: Never Used  . Alcohol Use: No    Allergies: No Known Allergies  Prescriptions prior to admission  Medication Sig Dispense Refill Last Dose  . ibuprofen (ADVIL,MOTRIN) 800 MG tablet Take 1 tablet (800 mg total) by mouth 3 (three) times daily. (Patient not taking: Reported on 10/15/2014) 21  tablet 0 Not Taking at Unknown time  . naproxen (NAPROSYN) 500 MG tablet Take 1 tablet (500 mg total) by mouth 2 (two) times daily with a meal. (Patient not taking: Reported on 10/15/2014) 30 tablet 0 Not Taking at Unknown time  . predniSONE (DELTASONE) 20 MG tablet Take 1 tablet (20 mg total) by mouth 2 (two) times daily. (Patient not taking: Reported on 02/11/2014) 10 tablet 0 Completed Course at Unknown time  . [DISCONTINUED] acyclovir (ZOVIRAX) 400 MG tablet Take 1 tablet (400 mg total) by mouth 4 (four) times daily. (Patient not taking: Reported on 02/11/2014) 50 tablet 0 Not Taking at Unknown time  . [DISCONTINUED] amoxicillin (AMOXIL) 400 MG/5ML suspension 12.5 mL TID (Patient not taking: Reported on 02/11/2014) 380 mL 0 Not Taking at Unknown time  . [DISCONTINUED] cephALEXin (KEFLEX) 500 MG capsule Take 1 capsule (500 mg total) by mouth 3 (three) times daily. (Patient not taking: Reported on 02/11/2014) 30 capsule 0 Not Taking at Unknown time  . [DISCONTINUED] hydrOXYzine (ATARAX/VISTARIL) 25 MG tablet Take 1 tablet (25 mg total) by mouth every 6 (six) hours. (Patient not taking: Reported on 02/11/2014) 20 tablet 0 Not Taking at Unknown time  . [DISCONTINUED] metroNIDAZOLE (FLAGYL) 500 MG tablet Take 1 tablet (500 mg total) by mouth 2 (two) times daily. (Patient not taking: Reported on 02/11/2014) 14 tablet 0 Not Taking at Unknown time    Review of Systems  Constitutional: Negative for fever.  Gastrointestinal: Positive for nausea, vomiting and abdominal pain. Negative for diarrhea  and constipation.  Genitourinary: Negative for dysuria, urgency and frequency.   Physical Exam   Blood pressure 112/69, pulse 73, temperature 98.1 F (36.7 C), resp. rate 18, height 5\' 2"  (1.575 m), weight 56.972 kg (125 lb 9.6 oz), last menstrual period 08/17/2014.  Physical Exam  Nursing note and vitals reviewed. Constitutional: She is oriented to person, place, and time. She appears well-developed and  well-nourished. No distress.  HENT:  Head: Normocephalic.  Cardiovascular: Normal rate.   Respiratory: Effort normal.  GI: Soft. There is no tenderness.  Genitourinary:   External: no lesion Vagina: small amount of white discharge Cervix: pink, smooth, no CMT Uterus: NSSC Adnexa: NT   Neurological: She is oriented to person, place, and time.  Skin: Skin is warm and dry.  Psychiatric: She has a normal mood and affect.   Results for orders placed or performed during the hospital encounter of 10/15/14 (from the past 24 hour(s))  Urinalysis, Routine w reflex microscopic (not at [facility])     Status: Abnormal   Collection Time: 10/15/14  6:35 PM  Result Value Ref Range   Color, Urine YELLOW YELLOW   APPearance CLEAR CLEAR   Specific Gravity, Urine 1.025 1.005 - 1.030   pH 5.5 5.0 - 8.0   Glucose, UA NEGATIVE NEGATIVE mg/dL   Hgb urine dipstick NEGATIVE NEGATIVE   Bilirubin Urine SMALL (A) NEGATIVE   Ketones, ur >80 (A) NEGATIVE mg/dL   Protein, ur NEGATIVE NEGATIVE mg/dL   Urobilinogen, UA 1.0 0.0 - 1.0 mg/dL   Nitrite NEGATIVE NEGATIVE   Leukocytes, UA NEGATIVE NEGATIVE  Pregnancy, urine POC     Status: Abnormal   Collection Time: 10/15/14  7:04 PM  Result Value Ref Range   Preg Test, Ur POSITIVE (A) NEGATIVE  CBC     Status: Abnormal   Collection Time: 10/15/14  8:20 PM  Result Value Ref Range   WBC 4.1 4.0 - 10.5 K/uL   RBC 3.97 3.87 - 5.11 MIL/uL   Hemoglobin 11.2 (L) 12.0 - 15.0 g/dL   HCT 16.1 (L) 09.6 - 04.5 %   MCV 81.6 78.0 - 100.0 fL   MCH 28.2 26.0 - 34.0 pg   MCHC 34.6 30.0 - 36.0 g/dL   RDW 40.9 81.1 - 91.4 %   Platelets 232 150 - 400 K/uL  hCG, quantitative, pregnancy     Status: Abnormal   Collection Time: 10/15/14  8:20 PM  Result Value Ref Range   hCG, Beta Chain, Quant, S [id number] (H) <5 mIU/mL  ABO/Rh     Status: None   Collection Time: 10/15/14  8:20 PM  Result Value Ref Range   ABO/RH(D) O POS   Wet prep, genital     Status: Abnormal   Collection  Time: 10/15/14  8:37 PM  Result Value Ref Range   Yeast Wet Prep HPF POC NONE SEEN NONE SEEN   Trich, Wet Prep NONE SEEN NONE SEEN   Clue Cells Wet Prep HPF POC RARE (A) NONE SEEN   WBC, Wet Prep HPF POC RARE (A) NONE SEEN   US Ob Comp Less 14 Wks  10/15/2014   CLINICAL DATA:  Two week history of pelvic pain  EXAM: OBSTETRIC <14 WK [place] AND TRANSVAGINAL OB US  TECHNIQUE: Both transabdominal and transvaginal ultrasound examinations were performed for complete evaluation of the gestation as well as the maternal uterus, adnexal regions, and pelvic cul-de-sac. Transvaginal technique was performed to assess early pregnancy.  COMPARISON:  None.  FINDINGS: Intrauterine gestational sac:  Visualized/normal in shape.  Yolk sac:  Visualized  Embryo:  Visualized  Cardiac Activity: Visualized  Heart Rate: 160  bpm  CRL:  16  mm   8 w   0 d                  [place] EDC: May 27, 2015  Maternal uterus/adnexae: There is a subchorionic hemorrhage measuring 1.4 x 0.6 cm. Cervical os is closed. Maternal ovaries appear normal bilaterally. Small corpus luteum noted in right ovary, a physiologic finding. There is trace free pelvic fluid.  IMPRESSION: Single live intrauterine gestation with estimated gestational age of [redacted] weeks. Small subchorionic hemorrhage. Trace free pelvic fluid may be physiologic.   Electronically Signed   By: Bretta Bang III M.D.   On: 10/15/2014 22:27   US Ob Transvaginal  10/15/2014   CLINICAL DATA:  Two week history of pelvic pain  EXAM: OBSTETRIC <14 WK [place] AND TRANSVAGINAL OB US  TECHNIQUE: Both transabdominal and transvaginal ultrasound examinations were performed for complete evaluation of the gestation as well as the maternal uterus, adnexal regions, and pelvic cul-de-sac. Transvaginal technique was performed to assess early pregnancy.  COMPARISON:  None.  FINDINGS: Intrauterine gestational sac: Visualized/normal in shape.  Yolk sac:  Visualized  Embryo:  Visualized  Cardiac Activity: Visualized   Heart Rate: 160  bpm  CRL:  16  mm   8 w   0 d                  [place] EDC: May 27, 2015  Maternal uterus/adnexae: There is a subchorionic hemorrhage measuring 1.4 x 0.6 cm. Cervical os is closed. Maternal ovaries appear normal bilaterally. Small corpus luteum noted in right ovary, a physiologic finding. There is trace free pelvic fluid.  IMPRESSION: Single live intrauterine gestation with estimated gestational age of [redacted] weeks. Small subchorionic hemorrhage. Trace free pelvic fluid may be physiologic.   Electronically Signed   By: Bretta Bang III M.D.   On: 10/15/2014 22:27    MAU Course  Procedures  MDM  Assessment and Plan   1. Nausea/vomiting in pregnancy   2. Abdominal pain affecting pregnancy    DC home Comfort measures reviewed  1st Trimester precautions  Bleeding precautions RX: phenergan #[id number]  Return to MAU as needed FU with OB as planned  Follow-up Information    Schedule an appointment as soon as possible for a visit with [facility] [place].   Contact information:   [address] [facility] [address] [phone]       Tawnya Crook 10/15/2014, 8:35 PM

## 2014-10-16 LAB — GC/CHLAMYDIA PROBE AMP (~~LOC~~) NOT AT ARMC
Chlamydia: NEGATIVE
NEISSERIA GONORRHEA: NEGATIVE

## 2014-10-16 LAB — HIV ANTIBODY (ROUTINE TESTING W REFLEX): HIV SCREEN 4TH GENERATION: NONREACTIVE

## 2014-12-04 LAB — OB RESULTS CONSOLE HEPATITIS B SURFACE ANTIGEN: Hepatitis B Surface Ag: NEGATIVE

## 2014-12-04 LAB — OB RESULTS CONSOLE RPR: RPR: NONREACTIVE

## 2014-12-04 LAB — OB RESULTS CONSOLE ANTIBODY SCREEN: Antibody Screen: NEGATIVE

## 2015-03-08 NOTE — L&D Delivery Note (Signed)
Final Labor Progress Note Called to room by RN,  VE C/C/+2.  Upon entering room, fetal head crowning.  FHR remained reassuring with occasional early decelerations.  Vaginal Delivery Note The pt utilized coping skills for pain management.   Spontaneous rupture of membranes today, at 2100, clear.  GBS was negative.  Cervical dilation was complete at  2150.  NICHD Category 1.    Spontaneous Pushing at 2150.   After 3 minutes of pushing the head, shoulders and the body of a viable female infant  delivered spontaneously with maternal effort in the LOA position at 2153. Loose [place] x 1, easily reduced.   With vigorous tone and spontaneous cry, the infant was placed on moms abd. After the umbilical cord was clamped it was cut by the FOB, then cord blood was obtained for evaluation.   Spontaneous delivery of a intact placenta with a 3 vessel cord via [place] at 2200.    Episiotomy: None   The vulva, perineum, vaginal vault, rectum and cervix were inspected and revealed a 1rst degree perineal, repaired using a 3-0 vicryl on a CT needle with 8cc of 1% lidocaine and a superficial bilateral periurethral laceration which was hemostatic .  The rectum sphincter intact after the repair. Patient tolerated repair well.   Postpartum pitocin as ordered.  Fundus firm, lochia minimum, bleeding under control.   EBL 50ml, Pt hemodynamically stable.   Sponge, laps and needle count correct and verified with the primary care nurse.  Attending MD available at all times.    Routine postpartum orders   Mother unsure about method of contraception Mom plans to breastfeed   Placenta to pathology: NO     Cord Gases sent to lab: NO Cord blood sent to lab: YES   APGARS:  8 at 1 minute and 9 at 5 minutes Weight:.    Both mom and baby were left in stable condition, baby skin to skin.    Christy Gonzalez, CNM, MSN 05/20/2015. 10:52 PM

## 2015-05-20 ENCOUNTER — Encounter (HOSPITAL_COMMUNITY): Payer: Self-pay

## 2015-05-20 ENCOUNTER — Inpatient Hospital Stay (HOSPITAL_COMMUNITY)
Admission: AD | Admit: 2015-05-20 | Discharge: 2015-05-22 | DRG: 775 | Disposition: A | Discharge status: Home / Self Care | Payer: Medicaid Other | Source: Ambulatory Visit | Attending: Obstetrics and Gynecology | Admitting: Obstetrics and Gynecology

## 2015-05-20 DIAGNOSIS — D649 Anemia, unspecified: Secondary | ICD-10-CM | POA: Diagnosis present

## 2015-05-20 DIAGNOSIS — O99019 Anemia complicating pregnancy, unspecified trimester: Secondary | ICD-10-CM | POA: Diagnosis present

## 2015-05-20 DIAGNOSIS — Z2839 Other underimmunization status: Secondary | ICD-10-CM

## 2015-05-20 DIAGNOSIS — O9902 Anemia complicating childbirth: Secondary | ICD-10-CM | POA: Diagnosis present

## 2015-05-20 DIAGNOSIS — O99334 Smoking (tobacco) complicating childbirth: Secondary | ICD-10-CM | POA: Diagnosis present

## 2015-05-20 DIAGNOSIS — Z3A39 39 weeks gestation of pregnancy: Secondary | ICD-10-CM | POA: Diagnosis not present

## 2015-05-20 DIAGNOSIS — O09899 Supervision of other high risk pregnancies, unspecified trimester: Secondary | ICD-10-CM

## 2015-05-20 DIAGNOSIS — O9989 Other specified diseases and conditions complicating pregnancy, childbirth and the puerperium: Secondary | ICD-10-CM

## 2015-05-20 DIAGNOSIS — Z8249 Family history of ischemic heart disease and other diseases of the circulatory system: Secondary | ICD-10-CM

## 2015-05-20 DIAGNOSIS — Z3490 Encounter for supervision of normal pregnancy, unspecified, unspecified trimester: Secondary | ICD-10-CM

## 2015-05-20 DIAGNOSIS — Z283 Underimmunization status: Secondary | ICD-10-CM

## 2015-05-20 DIAGNOSIS — O4202 Full-term premature rupture of membranes, onset of labor within 24 hours of rupture: Secondary | ICD-10-CM | POA: Diagnosis present

## 2015-05-20 LAB — CBC
HEMATOCRIT: 34.6 % — AB (ref 36.0–46.0)
Hemoglobin: 11.8 g/dL — ABNORMAL LOW (ref 12.0–15.0)
MCH: 28.9 pg (ref 26.0–34.0)
MCHC: 34.1 g/dL (ref 30.0–36.0)
MCV: 84.6 fL (ref 78.0–100.0)
PLATELETS: 204 10*3/uL (ref 150–400)
RBC: 4.09 MIL/uL (ref 3.87–5.11)
RDW: 13.5 % (ref 11.5–15.5)
WBC: 10 10*3/uL (ref 4.0–10.5)

## 2015-05-20 LAB — TYPE AND SCREEN
ABO/RH(D): O POS
ANTIBODY SCREEN: NEGATIVE

## 2015-05-20 LAB — POCT FERN TEST: POCT FERN TEST: POSITIVE

## 2015-05-20 MED ORDER — ONDANSETRON HCL 4 MG/2ML IJ SOLN: 4.0000 mg | Freq: Four times a day (QID) | INTRAMUSCULAR | Status: DC | PRN

## 2015-05-20 MED ORDER — IBUPROFEN 600 MG PO TABS
600.0000 mg | ORAL_TABLET | Freq: Four times a day (QID) | ORAL | Status: DC
  Administered 2015-05-20 – 2015-05-22 (×8): 600 mg via ORAL
  Filled 2015-05-20 (×8): qty 1

## 2015-05-20 MED ORDER — LIDOCAINE HCL (PF) 1 % IJ SOLN
30.0000 mL | INTRAMUSCULAR | Status: DC | PRN
  Administered 2015-05-20: 30 mL via SUBCUTANEOUS
  Filled 2015-05-20: qty 30

## 2015-05-20 MED ORDER — FENTANYL 2.5 MCG/ML BUPIVACAINE 1/10 % EPIDURAL INFUSION (WH - ANES): 14.0000 mL/h | INTRAMUSCULAR | Status: DC | PRN

## 2015-05-20 MED ORDER — OXYTOCIN BOLUS FROM INFUSION
500.0000 mL | INTRAVENOUS | Status: DC
  Administered 2015-05-20: 500 mL via INTRAVENOUS

## 2015-05-20 MED ORDER — FLEET ENEMA 7-19 GM/118ML RE ENEM: 1.0000 | ENEMA | RECTAL | Status: DC | PRN

## 2015-05-20 MED ORDER — OXYCODONE-ACETAMINOPHEN 5-325 MG PO TABS: 1.0000 | ORAL_TABLET | ORAL | Status: DC | PRN

## 2015-05-20 MED ORDER — OXYCODONE-ACETAMINOPHEN 5-325 MG PO TABS: 2.0000 | ORAL_TABLET | ORAL | Status: DC | PRN

## 2015-05-20 MED ORDER — PHENYLEPHRINE 40 MCG/ML (10ML) SYRINGE FOR IV PUSH (FOR BLOOD PRESSURE SUPPORT)
80.0000 ug | PREFILLED_SYRINGE | INTRAVENOUS | Status: DC | PRN
  Filled 2015-05-20: qty 2

## 2015-05-20 MED ORDER — LACTATED RINGERS IV SOLN: 500.0000 mL | INTRAVENOUS | Status: DC | PRN

## 2015-05-20 MED ORDER — EPHEDRINE 5 MG/ML INJ
10.0000 mg | INTRAVENOUS | Status: DC | PRN
  Filled 2015-05-20: qty 2

## 2015-05-20 MED ORDER — LACTATED RINGERS IV SOLN: 500.0000 mL | Freq: Once | INTRAVENOUS | Status: DC

## 2015-05-20 MED ORDER — CITRIC ACID-SODIUM CITRATE 334-500 MG/5ML PO SOLN: 30.0000 mL | ORAL | Status: DC | PRN

## 2015-05-20 MED ORDER — ACETAMINOPHEN 325 MG PO TABS: 650.0000 mg | ORAL_TABLET | ORAL | Status: DC | PRN

## 2015-05-20 MED ORDER — LACTATED RINGERS IV SOLN
2.5000 [IU]/h | INTRAVENOUS | Status: DC
Start: 2015-05-20 — End: 2015-05-21
  Administered 2015-05-20: 23:00:00 via INTRAVENOUS
  Filled 2015-05-20: qty 10

## 2015-05-20 MED ORDER — LACTATED RINGERS IV SOLN
INTRAVENOUS | Status: DC
  Administered 2015-05-20: 22:00:00 via INTRAVENOUS

## 2015-05-20 MED ORDER — DIPHENHYDRAMINE HCL 50 MG/ML IJ SOLN: 12.5000 mg | INTRAMUSCULAR | Status: DC | PRN

## 2015-05-20 NOTE — MAU Note (Signed)
Pt presents complaining of contractions she isn't timing. 4cm in office today. Denies leaking or bleeding. Reports good fetal movement.

## 2015-05-20 NOTE — H&P (Signed)
Christy Gonzalez is a 20 y.o. female, G2P1001  at 39.2 weeks, presenting for contractions starting at 2pm that have increase in frequency and intensity along with rupture of membranes at 2113.   Patient Active Problem List   Diagnosis Date Noted  . Spontaneous vaginal delivery 05/21/2015  . Rubella non-immune status, antepartum 05/21/2015  . Anemia of pregnancy 05/21/2015  . Normal labor 05/20/2015    History of present pregnancy: Patient entered care at 16.4 weeks.    EDC of 05/24/15 was established by LMP of 08/17/14 .    Anatomy scan:  21.1 weeks on 01/715, with normal findings and an posterior placenta.   SIUP, vertex, cervix closed- measured transabdominally 3.9cm, posterior placenta- placental edge 7.6 cm from internal os,  Fluid is normal, profile, philtrum, 5th digits, heels -seen.  All anatomy seen- wnl. Adenexa/Ovaries wnl/    Additional [place] evaluations:   10/15/14:   SIUP at 8wks. [facility] noted.   Significant prenatal events:  First Trimester: No data Second Trimester:    Anemia noted with NOB labs, BV tx with Flagyl,  Nornal discomforts of pregnancy such as round ligament pain,  Third Trimester: Pelvic pressure and BH contractions at 33 wks. Other discomforts noted such as low back pain  Last evaluation:  05/20/15 at 39.3 wks by S. Alferd Apa, CNM , FH 39,  Vertex, FHT 130bpm, Contractions, 4/80/-2,   BP 106/60 ,  182 lbs   OB History    Gravida Para Term Preterm AB TAB SAB Ectopic Multiple Living   0 2     07/30/2010 , 40 wks,  F,  6lbs 13oz,  SVD   History reviewed. No pertinent past medical history. History reviewed. No pertinent past surgical history. Family History: family history includes Hypertension in her mother. Social History:  reports that she has been smoking.  She has never used smokeless tobacco. She reports that she does not drink alcohol or use illicit drugs.  African American, 11 grade education , [employer], lives with domestic partner,   Bisexual Prenatal Transfer Tool  Maternal Diabetes: No Genetic Screening: Normal Maternal Ultrasounds/Referrals: Normal Fetal Ultrasounds or other Referrals:  None Maternal Substance Abuse:  No Significant Maternal Medications:  None Significant Maternal Lab Results: Lab values include: Group B Strep negative  TDAP no record Flu  No record  ROS:  All system within normal limits except where noted in HPI  No Known Allergies   Dilation: 10 Effacement (%): 100 Station: +2, +1 Exam by:: a. thacker rn Blood pressure 110/68, pulse 83, temperature 98.1 F (36.7 C), temperature source Oral, resp. rate 16, height  (1.626 m), weight 76.431 kg (168 lb 8 oz), last menstrual period 08/17/2014, unknown if currently breastfeeding.  Chest clear Heart RRR without murmur Abd gravid, NT, FH 40  Pelvic: proven to 6lbs 13 oz Ext: wnl   FHR: Category 1, 125 bpm, moderate variability, +accels, no decels UCs:  Regular, every 2-4 min,  Prenatal labs: ABO, Rh: --/--/O POS (03/15 2141) Antibody: NEG (03/15 2141)   Negative   12/04/14 Rubella:  !Error!   Rubella non immune RPR: Nonreactive (09/29 0000)    NR    12/04/14 HBsAg: Negative (09/29 0000) Negative 12/04/14 HIV: Non Reactive (08/10 2020)  GBS:    negative Sickle cell/Hgb electrophoresis:   AA% Pap:  Wnl, 10/15/2014 GC:  Negative    12/04/14 Chlamydia:  Negative    12/04/14 Genetic screenings:  Negative, AFP Glucola:  wnl Other:   Hgb 10.8 at NOB, 10.3 at 28 weeks    Assessment/Plan: IUP at 39.2 wks  Late to prenatal care Anemia of pregnancy Cat 1 FHT GBS negative Rubella non immune   [facility]: [facility] Dr[facility] Richardson Doppole Routine [facility] orders Pain med/epidural prn Expectant management Anticipate SVD  Beatrix FettersRachel StallCNM, [place] 05/21/2015, 3:53 AM

## 2015-05-21 ENCOUNTER — Encounter (HOSPITAL_COMMUNITY): Payer: Self-pay

## 2015-05-21 DIAGNOSIS — Z283 Underimmunization status: Secondary | ICD-10-CM

## 2015-05-21 DIAGNOSIS — O99019 Anemia complicating pregnancy, unspecified trimester: Secondary | ICD-10-CM | POA: Diagnosis present

## 2015-05-21 DIAGNOSIS — Z3490 Encounter for supervision of normal pregnancy, unspecified, unspecified trimester: Secondary | ICD-10-CM

## 2015-05-21 DIAGNOSIS — Z2839 Other underimmunization status: Secondary | ICD-10-CM

## 2015-05-21 DIAGNOSIS — O9989 Other specified diseases and conditions complicating pregnancy, childbirth and the puerperium: Secondary | ICD-10-CM

## 2015-05-21 LAB — CBC
HEMATOCRIT: 32.6 % — AB (ref 36.0–46.0)
HEMOGLOBIN: 11.2 g/dL — AB (ref 12.0–15.0)
MCH: 29.1 pg (ref 26.0–34.0)
MCHC: 34.4 g/dL (ref 30.0–36.0)
MCV: 84.7 fL (ref 78.0–100.0)
Platelets: 203 10*3/uL (ref 150–400)
RBC: 3.85 MIL/uL — AB (ref 3.87–5.11)
RDW: 13.6 % (ref 11.5–15.5)
WBC: 10.3 10*3/uL (ref 4.0–10.5)

## 2015-05-21 LAB — RPR: RPR Ser Ql: NONREACTIVE

## 2015-05-21 MED ORDER — BENZOCAINE-MENTHOL 20-0.5 % EX AERO
1.0000 "application " | INHALATION_SPRAY | CUTANEOUS | Status: DC | PRN
  Administered 2015-05-21: 1 via TOPICAL
  Filled 2015-05-21: qty 56

## 2015-05-21 MED ORDER — ONDANSETRON HCL 4 MG/2ML IJ SOLN: 4.0000 mg | INTRAMUSCULAR | Status: DC | PRN

## 2015-05-21 MED ORDER — DIPHENHYDRAMINE HCL 25 MG PO CAPS: 25.0000 mg | ORAL_CAPSULE | Freq: Four times a day (QID) | ORAL | Status: DC | PRN

## 2015-05-21 MED ORDER — WITCH HAZEL-GLYCERIN EX PADS: 1.0000 "application " | MEDICATED_PAD | CUTANEOUS | Status: DC | PRN

## 2015-05-21 MED ORDER — DIBUCAINE 1 % RE OINT: 1.0000 "application " | TOPICAL_OINTMENT | RECTAL | Status: DC | PRN

## 2015-05-21 MED ORDER — OXYCODONE-ACETAMINOPHEN 5-325 MG PO TABS: 2.0000 | ORAL_TABLET | ORAL | Status: DC | PRN

## 2015-05-21 MED ORDER — DOCUSATE SODIUM 100 MG PO CAPS
100.0000 mg | ORAL_CAPSULE | Freq: Two times a day (BID) | ORAL | Status: DC
  Administered 2015-05-21 – 2015-05-22 (×3): 100 mg via ORAL
  Filled 2015-05-21 (×3): qty 1

## 2015-05-21 MED ORDER — ZOLPIDEM TARTRATE 5 MG PO TABS: 5.0000 mg | ORAL_TABLET | Freq: Every evening | ORAL | Status: DC | PRN

## 2015-05-21 MED ORDER — ACETAMINOPHEN 325 MG PO TABS: 650.0000 mg | ORAL_TABLET | ORAL | Status: DC | PRN

## 2015-05-21 MED ORDER — PRENATAL MULTIVITAMIN CH
1.0000 | ORAL_TABLET | Freq: Every day | ORAL | Status: DC
  Administered 2015-05-21 – 2015-05-22 (×2): 1 via ORAL
  Filled 2015-05-21 (×2): qty 1

## 2015-05-21 MED ORDER — SIMETHICONE 80 MG PO CHEW: 80.0000 mg | CHEWABLE_TABLET | ORAL | Status: DC | PRN

## 2015-05-21 MED ORDER — LANOLIN HYDROUS EX OINT: TOPICAL_OINTMENT | CUTANEOUS | Status: DC | PRN

## 2015-05-21 MED ORDER — ONDANSETRON HCL 4 MG PO TABS: 4.0000 mg | ORAL_TABLET | ORAL | Status: DC | PRN

## 2015-05-21 MED ORDER — OXYCODONE-ACETAMINOPHEN 5-325 MG PO TABS: 1.0000 | ORAL_TABLET | ORAL | Status: DC | PRN

## 2015-05-21 MED ORDER — TETANUS-DIPHTH-ACELL PERTUSSIS 5-2.5-18.5 LF-MCG/0.5 IM SUSP
0.5000 mL | Freq: Once | INTRAMUSCULAR | Status: AC
  Administered 2015-05-22: 0.5 mL via INTRAMUSCULAR

## 2015-05-21 NOTE — Progress Notes (Signed)
Subjective: Postpartum Day 1: Vaginal delivery, 1st degree perineal and superficial bilateral periurethral lacerations Patient up ad lib, reports no syncope or dizziness. Feeding: Bottle Contraceptive plan:  Undecided  Objective: Vital signs in last 24 hours: Temp:  [97.6 F (36.4 C)-98.4 F (36.9 C)] 97.6 F (36.4 C) (03/16 1214) Pulse Rate:  [71-88] 78 (03/16 1214) Resp:  [16-18] 18 (03/16 1214) BP: (106-129)/(52-82) 106/64 mmHg (03/16 1214) SpO2:  [98 %] 98 % (03/16 1214) Weight:  [76.431 kg (168 lb 8 oz)] 76.431 kg (168 lb 8 oz) (03/15 2231)  Physical Exam:  General: alert Lochia: appropriate Uterine Fundus: firm Perineum: healing well DVT Evaluation: No evidence of DVT seen on physical exam. Negative Homan's sign.   CBC Latest Ref Rng 05/21/2015 05/20/2015 10/15/2014  WBC 4.0 - 10.5 K/uL 10.3 10.0 4.1  Hemoglobin 12.0 - 15.0 g/dL 11.2(L) 11.8(L) 11.2(L)  Hematocrit 36.0 - 46.0 % 32.6(L) 34.6(L) 32.4(L)  Platelets 150 - 400 K/uL 203 204 232     Assessment/Plan: Status post vaginal delivery day 1. Stable Continue current care. Plan for discharge tomorrow   Nigel BridgemanLATHAM, Shai Mckenzie, CNM 05/21/2015, 3:30 PM

## 2015-05-21 NOTE — Lactation Note (Signed)
This note was copied from a baby's chart. Lactation Consultation Note  Patient Name: Girl Gerre PebblesJaquasha Offner JXBJY'NToday's Date: 05/21/2015 Reason for consult: Initial assessment Baby at 20 hr of life and mom is sleeping. [facility] with instructions to call at next feeding.   Maternal Data    Feeding Feeding Type: Breast Fed Nipple Type: Slow - flow  LATCH Score/Interventions Latch: Repeated attempts needed to sustain latch, nipple held in mouth throughout feeding, stimulation needed to elicit sucking reflex. Intervention(s): Adjust position;Assist with latch  Audible Swallowing: A few with stimulation  Type of Nipple: Everted at rest and after stimulation  Comfort (Breast/Nipple): Soft / non-tender     Hold (Positioning): No assistance needed to correctly position infant at breast.  LATCH Score: 8  Lactation Tools Discussed/Used     Consult Status Consult Status: Follow-up Date: 05/21/15 Follow-up type: In-patient    Rulon Eisenmengerlizabeth E Kelsi Benham 05/21/2015, 6:13 PM

## 2015-05-22 MED ORDER — MEDROXYPROGESTERONE ACETATE 150 MG/ML IM SUSP
150.0000 mg | Freq: Once | INTRAMUSCULAR | Status: AC
  Administered 2015-05-22: 150 mg via INTRAMUSCULAR
  Filled 2015-05-22: qty 1

## 2015-05-22 MED ORDER — IBUPROFEN 600 MG PO TABS: 600.0000 mg | ORAL_TABLET | Freq: Four times a day (QID) | ORAL | Status: DC

## 2015-05-22 NOTE — Discharge Summary (Signed)
OB Discharge Summary  Patient Name: Christy Gonzalez DOB: November 02, 1995 MRN: [id number]  Date of admission: 05/20/2015 Delivering MD: Alphonzo Severance   Date of discharge: 05/22/2015  Admitting diagnosis: 39.3w in labor Intrauterine pregnancy: [redacted]w[redacted]d     Secondary diagnosis:Principal Problem:   Spontaneous vaginal delivery Active Problems:   Rubella non-immune status, antepartum   Anemia of pregnancy  Additional problems:n/a     Discharge diagnosis: Term Pregnancy Delivered and Anemia                                                                     Post partum procedures:n/a  Augmentation: n/a  Complications: None  Hospital course:  Onset of Labor With Vaginal Delivery     20 y.o. yo [id number] at [redacted]w[redacted]d was admitted in Latent Labor on 05/20/2015. Patient had an uncomplicated labor course as follows:  Membrane Rupture Time/Date: 9:00 PM ,05/20/2015   Intrapartum Procedures: Episiotomy: None [1]                                         Lacerations:  1st degree [2];Perineal [11];Periurethral [8]  Patient had a delivery of a Viable infant. 05/20/2015  Information for the patient's newborn:  Yazaira, Speas [[id number]]  Delivery Method: Vaginal, Spontaneous Delivery (Filed from Delivery Summary)    [name] had an uncomplicated postpartum course.  She is ambulating, tolerating a regular diet, passing flatus, and urinating well. Patient is discharged home in stable condition on 05/22/2015.    Physical exam  Filed Vitals:   05/21/15 0430 05/21/15 1214 05/21/15 1700 05/22/15 0546  BP: 112/52 106/64 113/69 105/63  Pulse: 88 78 79 72  Temp: 98.4 F (36.9 C) 97.6 F (36.4 C) 97.9 F (36.6 C) 98.1 F (36.7 C)  TempSrc: Oral Oral Oral Oral  Resp: Height:      Weight:      SpO2:  98%     General: alert, cooperative and no distress Lochia: appropriate Uterine Fundus: firm Incision: Healing well with no significant drainage DVT Evaluation: No evidence of DVT seen  on physical exam. Labs: Lab Results  Component Value Date   WBC 10.3 05/21/2015   HGB 11.2* 05/21/2015   HCT 32.6* 05/21/2015   MCV 84.7 05/21/2015   PLT 203 05/21/2015   CMP Latest Ref Rng 02/11/2014  Glucose 70 - 99 mg/dL 96  BUN 6 - 23 mg/dL 15  Creatinine 1.30 - 8.65 mg/dL 7.84(O)  Sodium 962 - 952 mEq/L 137  Potassium 3.7 - 5.3 mEq/L 3.9  Chloride 96 - 112 mEq/L 99  CO2 19 - 32 mEq/L 25  Calcium 8.4 - 10.5 mg/dL 84.1  Total Protein 6.0 - 8.3 g/dL 7.7  Total Bilirubin 0.3 - 1.2 mg/dL 0.3  Alkaline Phos 39 - 117 U/L 61  AST 0 - 37 U/L 12  ALT 0 - 35 U/L 7    Discharge instruction: per After Visit Summary and "Baby and Me Booklet".  Medications:  Current facility-administered medications:  .  acetaminophen (TYLENOL) tablet 650 mg, 650 mg, Oral, Q4H PRN, Alphonzo Severance, CNM .  benzocaine-Menthol (DERMOPLAST) 20-0.5 % topical spray 1 application, 1  application, Topical, PRN, Alphonzo Severance, CNM, 1 application at 05/21/15 0545 .  witch hazel-glycerin (TUCKS) pad 1 application, 1 application, Topical, PRN **AND** dibucaine (NUPERCAINAL) 1 % rectal ointment 1 application, 1 application, Rectal, PRN, Alphonzo Severance, CNM .  diphenhydrAMINE (BENADRYL) capsule 25 mg, 25 mg, Oral, Q6H PRN, Alphonzo Severance, CNM .  docusate sodium (COLACE) capsule 100 mg, 100 mg, Oral, BID, Alphonzo Severance, CNM, 100 mg at 05/22/15 1132 .  ibuprofen (ADVIL,MOTRIN) tablet 600 mg, 600 mg, Oral, 4 times per day, Alphonzo Severance, CNM, 600 mg at 05/22/15 1132 .  lanolin ointment, , Topical, PRN, Alphonzo Severance, CNM .  lidocaine (PF) (XYLOCAINE) 1 % injection 30 mL, 30 mL, Subcutaneous, PRN, Alphonzo Severance, CNM, 30 mL at 05/20/15 2205 .  ondansetron (ZOFRAN) tablet 4 mg, 4 mg, Oral, Q4H PRN **OR** ondansetron (ZOFRAN) injection 4 mg, 4 mg, Intravenous, Q4H PRN, Alphonzo Severance, CNM .  oxyCODONE-acetaminophen (PERCOCET/ROXICET) 5-325 MG per tablet 1 tablet, 1 tablet, Oral, Q4H PRN, Alphonzo Severance, CNM .  oxyCODONE-acetaminophen  (PERCOCET/ROXICET) 5-325 MG per tablet 2 tablet, 2 tablet, Oral, Q4H PRN, Alphonzo Severance, CNM .  prenatal multivitamin tablet 1 tablet, 1 tablet, Oral, Q1200, Alphonzo Severance, CNM, 1 tablet at 05/21/15 1149 .  simethicone (MYLICON) chewable tablet 80 mg, 80 mg, Oral, PRN, Alphonzo Severance, CNM .  Tdap (BOOSTRIX) injection 0.5 mL, 0.5 mL, Intramuscular, Once, Alphonzo Severance, CNM .  zolpidem (AMBIEN) tablet 5 mg, 5 mg, Oral, QHS PRN, Alphonzo Severance, CNM After Visit Meds:    Medication List    TAKE these medications        ibuprofen 600 MG tablet  Commonly known as:  ADVIL,MOTRIN  Take 1 tablet (600 mg total) by mouth every 6 (six) hours.     IRON PO  Take 1 tablet by mouth daily.     prenatal multivitamin Tabs tablet  Take 1 tablet by mouth daily at 12 noon.        Diet: routine diet  Activity: Advance as tolerated. Pelvic rest for 6 weeks.   Outpatient follow up:6 weeks Follow up Appt:No future appointments. Follow up visit: No Follow-up on file.  Postpartum contraception: Depo Provera  Newborn Data: Live born female  Birth Weight: 5 lb 12.4 oz (2620 g) APGAR: 8, 9  Baby Feeding: Bottle and Breast Disposition:home with mother   05/22/2015 Airrion Otting, CNM      Postpartum Care After Vaginal Delivery  After you deliver your newborn (postpartum period), the usual stay in the hospital is 24 72 hours. If there were problems with your labor or delivery, or if you have other medical problems, you might be in the hospital longer.  While you are in the hospital, you will receive help and instructions on how to care for yourself and your newborn during the postpartum period.  While you are in the hospital:  Be sure to tell your nurses if you have pain or discomfort, as well as where you feel the pain and what makes the pain worse.  If you had an incision made near your vagina (episiotomy) or if you had some tearing during delivery, the nurses may put ice packs on your episiotomy  or tear. The ice packs may help to reduce the pain and swelling.  If you are breastfeeding, you may feel uncomfortable contractions of your uterus for a couple of weeks. This is normal. The contractions help your uterus get back to normal size.  It is normal to have some bleeding after delivery.  For the first 1 3 days after delivery, the flow is red and the amount may be similar to a period.  It is common for the flow to start and stop.  In the first few days, you may pass some small clots. Let your nurses know if you begin to pass large clots or your flow increases.  Do not  flush blood clots down the toilet before having the nurse look at them.  During the next 3 10 days after delivery, your flow should become more watery and pink or brown-tinged in color.  Ten to fourteen days after delivery, your flow should be a small amount of yellowish-white discharge.  The amount of your flow will decrease over the first few weeks after delivery. Your flow may stop in 6 8 weeks. Most women have had their flow stop by 12 weeks after delivery.  You should change your sanitary pads frequently.  Wash your hands thoroughly with soap and water for at least 20 seconds after changing pads, using the toilet, or before holding or feeding your newborn.  You should feel like you need to empty your bladder within the first 6 8 hours after delivery.  In case you become weak, lightheaded, or faint, call your nurse before you get out of bed for the first time and before you take a shower for the first time.  Within the first few days after delivery, your breasts may begin to feel tender and full. This is called engorgement. Breast tenderness usually goes away within 48 72 hours after engorgement occurs. You may also notice milk leaking from your breasts. If you are not breastfeeding, do not stimulate your breasts. Breast stimulation can make your breasts produce more milk.  Spending as much time as possible  with your newborn is very important. During this time, you and your newborn can feel close and get to know each other. Having your newborn stay in your room (rooming in) will help to strengthen the bond with your newborn. It will give you time to get to know your newborn and become comfortable caring for your newborn.  Your hormones change after delivery. Sometimes the hormone changes can temporarily cause you to feel sad or tearful. These feelings should not last more than a few days. If these feelings last longer than that, you should talk to your caregiver.  If desired, talk to your caregiver about methods of family planning or contraception.  Talk to your caregiver about immunizations. Your caregiver may want you to have the following immunizations before leaving the hospital:  Tetanus, diphtheria, and pertussis (Tdap) or tetanus and diphtheria (Td) immunization. It is very important that you and your family (including grandparents) or others caring for your newborn are up-to-date with the Tdap or Td immunizations. The Tdap or Td immunization can help protect your newborn from getting ill.  Rubella immunization.  Varicella (chickenpox) immunization.  Influenza immunization. You should receive this annual immunization if you did not receive the immunization during your pregnancy. Document Released: 12/19/2006 Document Revised: 11/16/2011 Document Reviewed: 10/19/2011 [facility] Patient Information 2014 [place], [place].   Postpartum Depression and Baby Blues  The postpartum period begins right after the birth of a baby. During this time, there is often a great amount of joy and excitement. It is also a time of considerable changes in the life of the parent(s). Regardless of how many times a mother gives birth, each child brings new challenges and dynamics to the family. It is not unusual to  have feelings of excitement accompanied by confusing shifts in moods, emotions, and thoughts. All mothers  are at risk of developing postpartum depression or the "baby blues." These mood changes can occur right after giving birth, or they may occur many months after giving birth. The baby blues or postpartum depression can be mild or severe. Additionally, postpartum depression can resolve rather quickly, or it can be a long-term condition. CAUSES Elevated hormones and their rapid decline are thought to be a main cause of postpartum depression and the baby blues. There are a number of hormones that radically change during and after pregnancy. Estrogen and progesterone usually decrease immediately after delivering your baby. The level of thyroid hormone and various cortisol steroids also rapidly drop. Other factors that play a major role in these changes include major life events and genetics.  RISK FACTORS If you have any of the following risks for the baby blues or postpartum depression, know what symptoms to watch out for during the postpartum period. Risk factors that may increase the likelihood of getting the baby blues or postpartum depression include: 1. Havinga personal or family history of depression. 2. Having depression while being pregnant. 3. Having premenstrual or oral contraceptive-associated mood issues. 4. Having exceptional life stress. 5. Having marital conflict. 6. Lacking a social support network. 7. Having a baby with special needs. 8. Having health problems such as diabetes. SYMPTOMS Baby blues symptoms include:  Brief fluctuations in mood, such as going from extreme happiness to sadness.  Decreased concentration.  Difficulty sleeping.  Crying spells, tearfulness.  Irritability.  Anxiety. Postpartum depression symptoms typically begin within the first month after giving birth. These symptoms include:  Difficulty sleeping or excessive sleepiness.  Marked weight loss.  Agitation.  Feelings of worthlessness.  Lack of interest in activity or food. Postpartum  psychosis is a very concerning condition and can be dangerous. Fortunately, it is rare. Displaying any of the following symptoms is cause for immediate medical attention. Postpartum psychosis symptoms include:  Hallucinations and delusions.  Bizarre or disorganized behavior.  Confusion or disorientation. DIAGNOSIS  A diagnosis is made by an evaluation of your symptoms. There are no medical or lab tests that lead to a diagnosis, but there are various questionnaires that a caregiver may use to identify those with the baby blues, postpartum depression, or psychosis. Often times, a screening tool called the [place] Postnatal Depression Scale is used to diagnose depression in the postpartum period.  TREATMENT The baby blues usually goes away on its own in 1 to 2 weeks. Social support is often all that is needed. You should be encouraged to get adequate sleep and rest. Occasionally, you may be given medicines to help you sleep.  Postpartum depression requires treatment as it can last several months or longer if it is not treated. Treatment may include individual or group therapy, medicine, or both to address any social, physiological, and psychological factors that may play a role in the depression. Regular exercise, a healthy diet, rest, and social support may also be strongly recommended.  Postpartum psychosis is more serious and needs treatment right away. Hospitalization is often needed. HOME CARE INSTRUCTIONS  Get as much rest as you can. Nap when the baby sleeps.  Exercise regularly. Some women find yoga and walking to be beneficial.  Eat a balanced and nourishing diet.  Do little things that you enjoy. Have a cup of tea, take a bubble bath, read your favorite magazine, or listen to your favorite music.  Avoid alcohol.  Ask for help with household chores, cooking, grocery shopping, or running errands as needed. Do not try to do everything.  Talk to people close to you about how you are  feeling. Get support from your partner, family members, friends, or other new moms.  Try to stay positive in how you think. Think about the things you are grateful for.  Do not spend a lot of time alone.  Only take medicine as directed by your caregiver.  Keep all your postpartum appointments.  Let your caregiver know if you have any concerns. SEEK MEDICAL CARE IF: You are having a reaction or problems with your medicine. SEEK IMMEDIATE MEDICAL CARE IF:  You have suicidal feelings.  You feel you may harm the baby or someone else. Document Released: 11/26/2003 Document Revised: 05/16/2011 Document Reviewed: 12/28/2010 [facility] Patient Information 2014 [place].     Breastfeeding Deciding to breastfeed is one of the best choices you can make for you and your baby. A change in hormones during pregnancy causes your breast tissue to grow and increases the number and size of your milk ducts. These hormones also allow proteins, sugars, and fats from your blood supply to make breast milk in your milk-producing glands. Hormones prevent breast milk from being released before your baby is born as well as prompt milk flow after birth. Once breastfeeding has begun, thoughts of your baby, as well as his or her sucking or crying, can stimulate the release of milk from your milk-producing glands.  BENEFITS OF BREASTFEEDING For Your Baby  Your first milk (colostrum) helps your baby's digestive system function better.   There are antibodies in your milk that help your baby fight off infections.   Your baby has a lower incidence of asthma, allergies, and sudden infant death syndrome.   The nutrients in breast milk are better for your baby than infant formulas and are designed uniquely for your baby's needs.   Breast milk improves your baby's brain development.   Your baby is less likely to develop other conditions, such as childhood obesity, asthma, or type 2 diabetes mellitus.  For  You   Breastfeeding helps to create a very special bond between you and your baby.   Breastfeeding is convenient. Breast milk is always available at the correct temperature and costs nothing.   Breastfeeding helps to burn calories and helps you lose the weight gained during pregnancy.   Breastfeeding makes your uterus contract to its prepregnancy size faster and slows bleeding (lochia) after you give birth.   Breastfeeding helps to lower your risk of developing type 2 diabetes mellitus, osteoporosis, and breast or ovarian cancer later in life. SIGNS THAT YOUR BABY IS HUNGRY Early Signs of Hunger  Increased alertness or activity.  Stretching.  Movement of the head from side to side.  Movement of the head and opening of the mouth when the corner of the mouth or cheek is stroked (rooting).  Increased sucking sounds, smacking lips, cooing, sighing, or squeaking.  Hand-to-mouth movements.  Increased sucking of fingers or hands. Late Signs of Hunger  Fussing.  Intermittent crying. Extreme Signs of Hunger Signs of extreme hunger will require calming and consoling before your baby will be able to breastfeed successfully. Do not wait for the following signs of extreme hunger to occur before you initiate breastfeeding:   Restlessness.  A loud, strong cry.   Screaming.   BREASTFEEDING BASICS Breastfeeding Initiation  Find a comfortable place to sit or lie down, with your neck and  back well supported.  Place a pillow or rolled up blanket under your baby to bring him or her to the level of your breast (if you are seated). Nursing pillows are specially designed to help support your arms and your baby while you breastfeed.  Make sure that your baby's abdomen is facing your abdomen.   Gently massage your breast. With your fingertips, massage from your chest wall toward your nipple in a circular motion. This encourages milk flow. You may need to continue this action during  the feeding if your milk flows slowly.  Support your breast with 4 fingers underneath and your thumb above your nipple. Make sure your fingers are well away from your nipple and your baby's mouth.   Stroke your baby's lips gently with your finger or nipple.   When your baby's mouth is open wide enough, quickly bring your baby to your breast, placing your entire nipple and as much of the colored area around your nipple (areola) as possible into your baby's mouth.   More areola should be visible above your baby's upper lip than below the lower lip.   Your baby's tongue should be between his or her lower gum and your breast.   Ensure that your baby's mouth is correctly positioned around your nipple (latched). Your baby's lips should create a seal on your breast and be turned out (everted).  It is common for your baby to suck about 2-3 minutes in order to start the flow of breast milk. Latching Teaching your baby how to latch on to your breast properly is very important. An improper latch can cause nipple pain and decreased milk supply for you and poor weight gain in your baby. Also, if your baby is not latched onto your nipple properly, he or she may swallow some air during feeding. This can make your baby fussy. Burping your baby when you switch breasts during the feeding can help to get rid of the air. However, teaching your baby to latch on properly is still the best way to prevent fussiness from swallowing air while breastfeeding. Signs that your baby has successfully latched on to your nipple:    Silent tugging or silent sucking, without causing you pain.   Swallowing heard between every 3-4 sucks.    Muscle movement above and in front of his or her ears while sucking.  Signs that your baby has not successfully latched on to nipple:   Sucking sounds or smacking sounds from your baby while breastfeeding.  Nipple pain. If you think your baby has not latched on correctly, slip  your finger into the corner of your baby's mouth to break the suction and place it between your baby's gums. Attempt breastfeeding initiation again. Signs of Successful Breastfeeding Signs from your baby:   A gradual decrease in the number of sucks or complete cessation of sucking.   Falling asleep.   Relaxation of his or her body.   Retention of a small amount of milk in his or her mouth.   Letting go of your breast by himself or herself. Signs from you:  Breasts that have increased in firmness, weight, and size 1-3 hours after feeding.   Breasts that are softer immediately after breastfeeding.  Increased milk volume, as well as a change in milk consistency and color by the fifth day of breastfeeding.   Nipples that are not sore, cracked, or bleeding. Signs That Your Pecola Leisure is Getting Enough Milk  Wetting at least 3 diapers in a  24-hour period. The urine should be clear and pale yellow by age 60 days.  At least 3 stools in a 24-hour period by age 60 days. The stool should be soft and yellow.  At least 3 stools in a 24-hour period by age 64 days. The stool should be seedy and yellow.  No loss of weight greater than 10% of birth weight during the first 61 days of age.  Average weight gain of 4-7 ounces (113-198 g) per week after age 82 days.  Consistent daily weight gain by age 60 days, without weight loss after the age of 2 weeks. After a feeding, your baby may spit up a small amount. This is common. BREASTFEEDING FREQUENCY AND DURATION Frequent feeding will help you make more milk and can prevent sore nipples and breast engorgement. Breastfeed when you feel the need to reduce the fullness of your breasts or when your baby shows signs of hunger. This is called "breastfeeding on demand." Avoid introducing a pacifier to your baby while you are working to establish breastfeeding (the first 4-6 weeks after your baby is born). After this time you may choose to use a pacifier. Research  has shown that pacifier use during the first year of a baby's life decreases the risk of sudden infant death syndrome (SIDS). Allow your baby to feed on each breast as long as he or she wants. Breastfeed until your baby is finished feeding. When your baby unlatches or falls asleep while feeding from the first breast, offer the second breast. Because newborns are often sleepy in the first few weeks of life, you may need to awaken your baby to get him or her to feed. Breastfeeding times will vary from baby to baby. However, the following rules can serve as a guide to help you ensure that your baby is properly fed:  Newborns (babies 71 weeks of age or younger) may breastfeed every 1-3 hours.  Newborns should not go longer than 3 hours during the day or 5 hours during the night without breastfeeding.  You should breastfeed your baby a minimum of 8 times in a 24-hour period until you begin to introduce solid foods to your baby at around 85 months of age. BREAST MILK PUMPING Pumping and storing breast milk allows you to ensure that your baby is exclusively fed your breast milk, even at times when you are unable to breastfeed. This is especially important if you are going back to work while you are still breastfeeding or when you are not able to be present during feedings. Your lactation consultant can give you guidelines on how long it is safe to store breast milk.  A breast pump is a machine that allows you to pump milk from your breast into a sterile bottle. The pumped breast milk can then be stored in a refrigerator or freezer. Some breast pumps are operated by hand, while others use electricity. Ask your lactation consultant which type will work best for you. Breast pumps can be purchased, but some hospitals and breastfeeding support groups lease breast pumps on a monthly basis. A lactation consultant can teach you how to hand express breast milk, if you prefer not to use a pump.  CARING FOR YOUR BREASTS  WHILE YOU BREASTFEED Nipples can become dry, cracked, and sore while breastfeeding. The following recommendations can help keep your breasts moisturized and healthy:  Avoid using soap on your nipples.   Wear a supportive bra. Although not required, special nursing bras and tank tops are  designed to allow access to your breasts for breastfeeding without taking off your entire bra or top. Avoid wearing underwire-style bras or extremely tight bras.  Air dry your nipples for 3-25minutes after each feeding.   Use only cotton bra pads to absorb leaked breast milk. Leaking of breast milk between feedings is normal.   Use lanolin on your nipples after breastfeeding. Lanolin helps to maintain your skin's normal moisture barrier. If you use pure lanolin, you do not need to wash it off before feeding your baby again. Pure lanolin is not toxic to your baby. You may also hand express a few drops of breast milk and gently massage that milk into your nipples and allow the milk to air dry. In the first few weeks after giving birth, some women experience extremely full breasts (engorgement). Engorgement can make your breasts feel heavy, warm, and tender to the touch. Engorgement peaks within 3-5 days after you give birth. The following recommendations can help ease engorgement:  Completely empty your breasts while breastfeeding or pumping. You may want to start by applying warm, moist heat (in the shower or with warm water-soaked hand towels) just before feeding or pumping. This increases circulation and helps the milk flow. If your baby does not completely empty your breasts while breastfeeding, pump any extra milk after he or she is finished.  Wear a snug bra (nursing or regular) or tank top for 1-2 days to signal your body to slightly decrease milk production.  Apply ice packs to your breasts, unless this is too uncomfortable for you.  Make sure that your baby is latched on and positioned properly while  breastfeeding. If engorgement persists after 48 hours of following these recommendations, contact your health care provider or a Advertising copywriter. OVERALL HEALTH CARE RECOMMENDATIONS WHILE BREASTFEEDING  Eat healthy foods. Alternate between meals and snacks, eating 3 of each per day. Because what you eat affects your breast milk, some of the foods may make your baby more irritable than usual. Avoid eating these foods if you are sure that they are negatively affecting your baby.  Drink milk, fruit juice, and water to satisfy your thirst (about 10 glasses a day).   Rest often, relax, and continue to take your prenatal vitamins to prevent fatigue, stress, and anemia.  Continue breast self-awareness checks.  Avoid chewing and smoking tobacco.  Avoid alcohol and drug use. Some medicines that may be harmful to your baby can pass through breast milk. It is important to ask your health care provider before taking any medicine, including all over-the-counter and prescription medicine as well as vitamin and herbal supplements. It is possible to become pregnant while breastfeeding. If birth control is desired, ask your health care provider about options that will be safe for your baby. SEEK MEDICAL CARE IF:   You feel like you want to stop breastfeeding or have become frustrated with breastfeeding.  You have painful breasts or nipples.  Your nipples are cracked or bleeding.  Your breasts are red, tender, or warm.  You have a swollen area on either breast.  You have a fever or chills.  You have nausea or vomiting.  You have drainage other than breast milk from your nipples.  Your breasts do not become full before feedings by the fifth day after you give birth.  You feel sad and depressed.  Your baby is too sleepy to eat well.  Your baby is having trouble sleeping.   Your baby is wetting less than 3 diapers  in a 24-hour period.  Your baby has less than 3 stools in a 24-hour  period.  Your baby's skin or the white part of his or her eyes becomes yellow.   Your baby is not gaining weight by 13 days of age. SEEK IMMEDIATE MEDICAL CARE IF:   Your baby is overly tired (lethargic) and does not want to wake up and feed.  Your baby develops an unexplained fever. Document Released: 02/21/2005 Document Revised: 02/26/2013 Document Reviewed: 08/15/2012 [facility] Patient Information 2015 [place], [place]. This information is not intended to replace advice given to you by your health care provider. Make sure you discuss any questions you have with your health care provider.

## 2015-05-22 NOTE — Lactation Note (Signed)
This note was copied from a baby's chart. Lactation Consultation Note  Patient Name: Christy Gerre PebblesJaquasha Vanderploeg HKVQQ'VToday's Date: 05/22/2015 Reason for consult: Follow-up assessment;Breast/nipple pain;Other (Comment) (see LC note )  Baby is 7338 hours old / breast formula . Per mom both of my nipples are sore. Mom requested  Nipples to be checked.  LC noted the top and side portions of the nipple to be  healthy tissue , the base of the nipple in the 2 o'clock position cracked and  Per mom sore. LC reviewed hand expressing and colostrum easily flowing. LC had mom apply it to both nipples.  LC reviewed sore nipple tx and prevention. LC recommended to feed on the breast that isn't has sore , prior to latch breast massage, hand express,  Pre- pump with a hand pump to make the base of the Nipple and areola more elastic. Skin to skin feedings, and breast compressions with latch until  swallows and then intermittent. Firm support with latch is also recommended.  Sore nipple and engorgement prevention and tx reviewed, mom instructed on use comfort gels, hand pump, and increase flange size to #27 if moms needs  It for when milk comes in. Referred mom to the Baby and me booklet pages 24 -25. LC had mom use hand pump to check flange size and #24 is a good fit for today.  LC unable to assess latch baby asleep and recently fed. Has been breast and formula.  Per mom active with WIC, and LC recommended with mom if soreness not improved by day 4 to call [facility].  Also reminded mom WIC can be a Acupuncturistlactation resource.  Mother informed of post-discharge support and given phone number to the lactation department, including services for phone call assistance; out-patient appointments; and breastfeeding support group. List of other breastfeeding resources in the community given in the handout. Encouraged mother to call for problems or concerns related to breastfeeding.      Maternal Data Has patient been taught  Hand Expression?: Yes Does the patient have breastfeeding experience prior to this delivery?: No  Feeding    LATCH Score/Interventions                      Lactation Tools Discussed/Used Tools: Comfort gels;Pump;Flanges Flange Size: 27 Breast pump type: Manual WIC Program: Yes Pump Review: Setup, frequency, and cleaning;Milk Storage Initiated by:: MAI  Date initiated:: 05/22/15   Consult Status Consult Status: Complete Date: 05/22/15    [name], [name] 05/22/2015, 12:37 PM

## 2016-10-12 IMAGING — US US TRANSVAGINAL NON-OB
1 series · 14 of 25 positions shown · non-contrast
Comparison: None

CLINICAL DATA: Abdominal and pelvic pain



[Series 1: us transvaginal non-ob · 0.18mm/px · 14 of 57 slices shown]
[im 1/57]
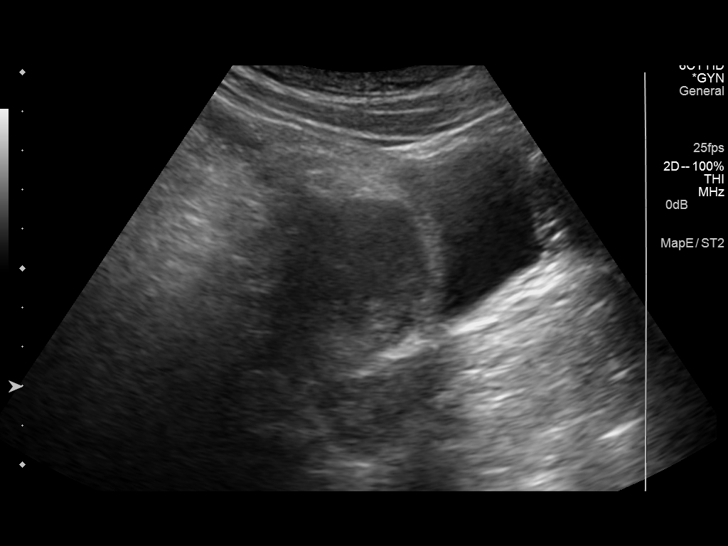
[im 5/57]
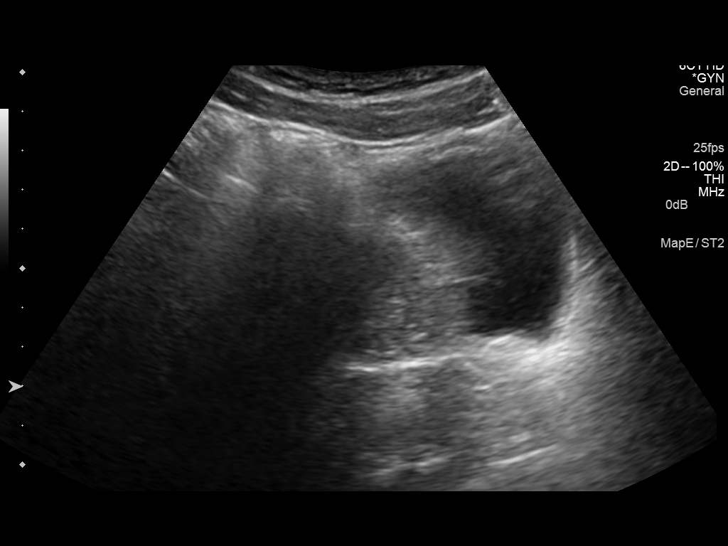
[im 10/57]
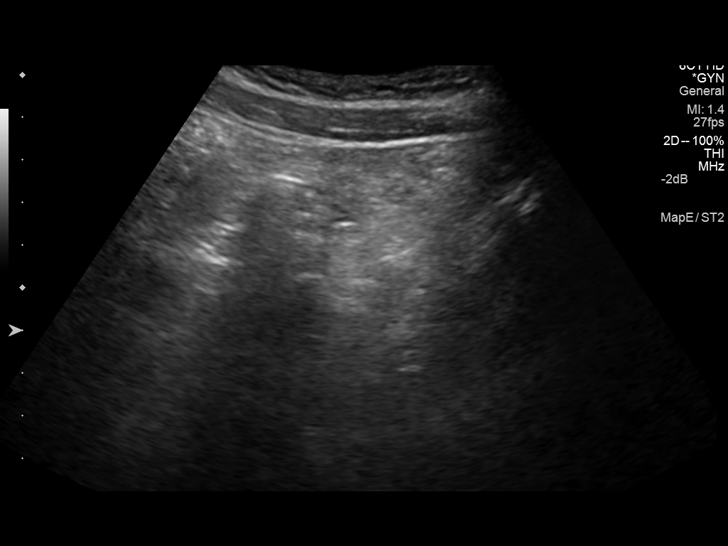
[im 15/57]
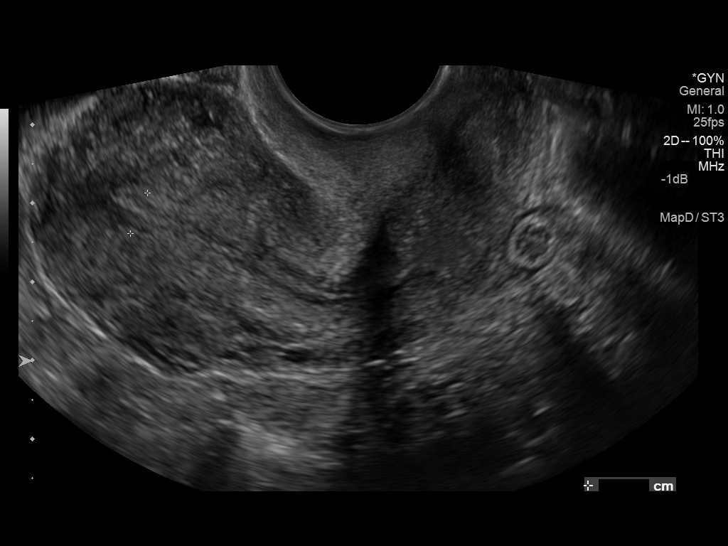
[im 19/57]
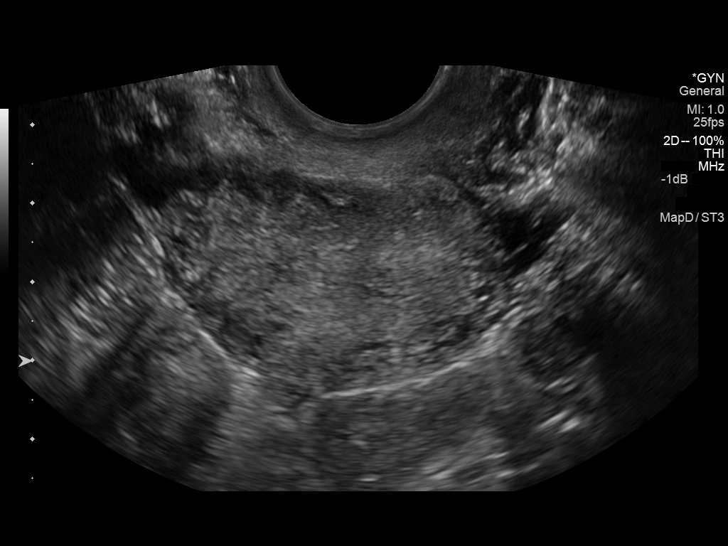
[im 22/57]
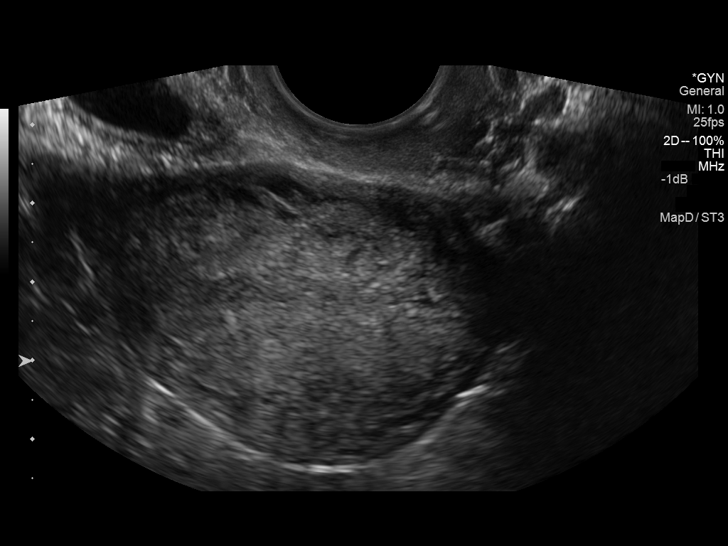
[im 26/57]
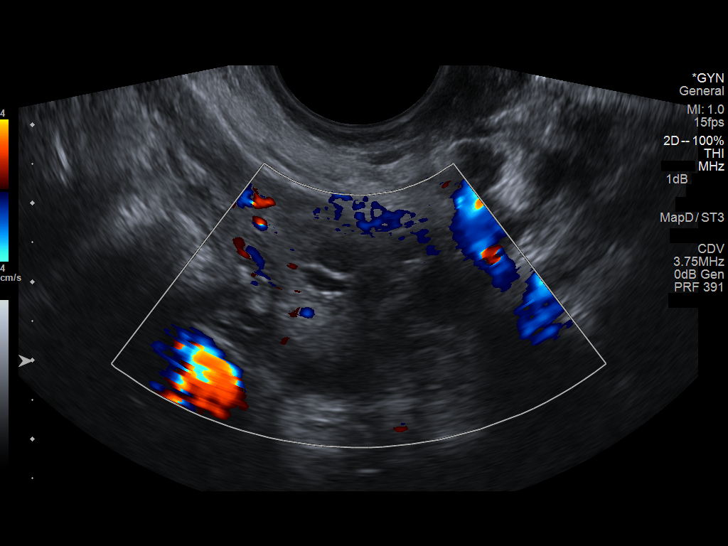
[im 31/57]
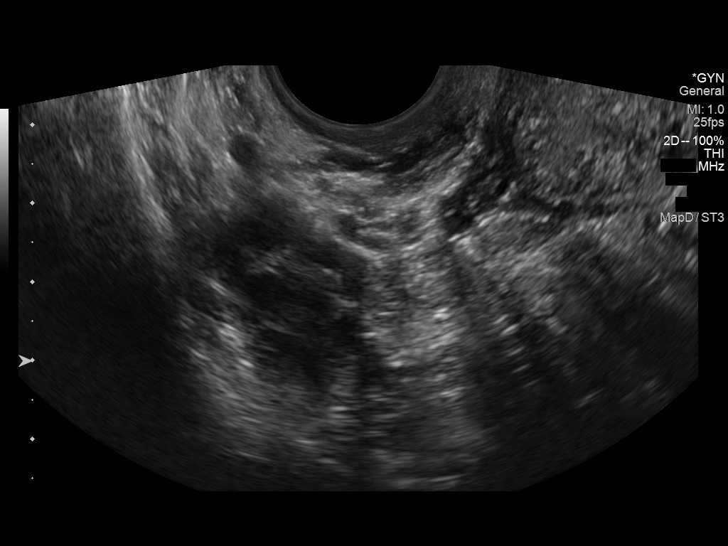
[im 36/57]
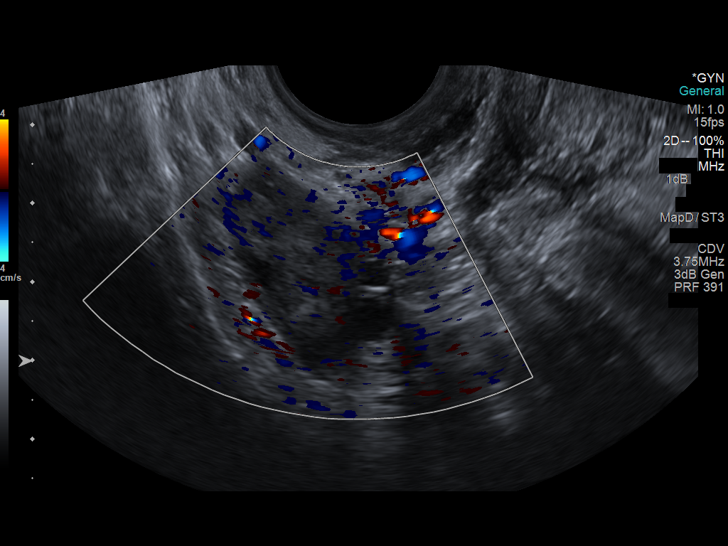
[im 38/57]
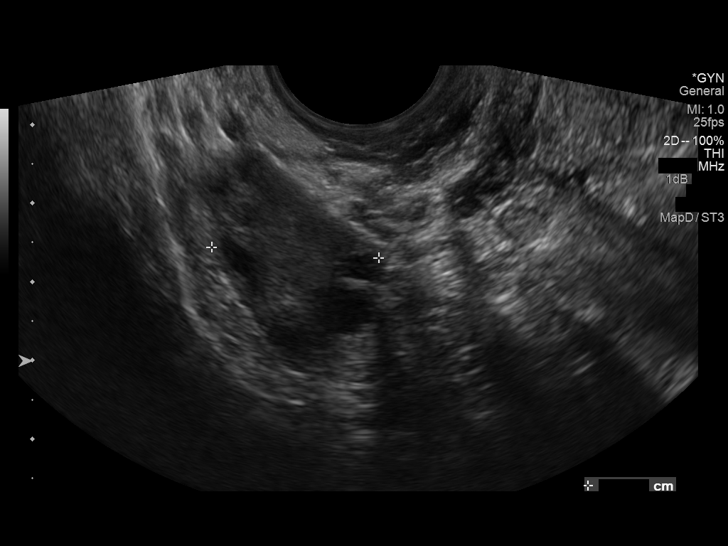
[im 43/57]
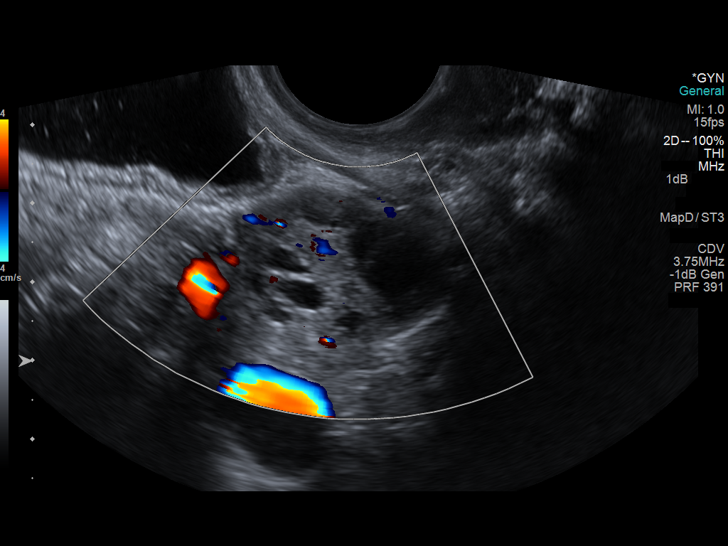
[im 47/57]
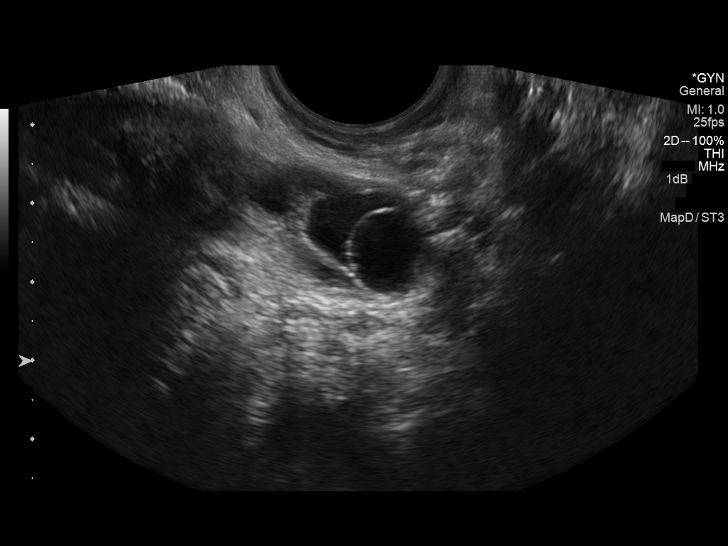
[im 52/57]
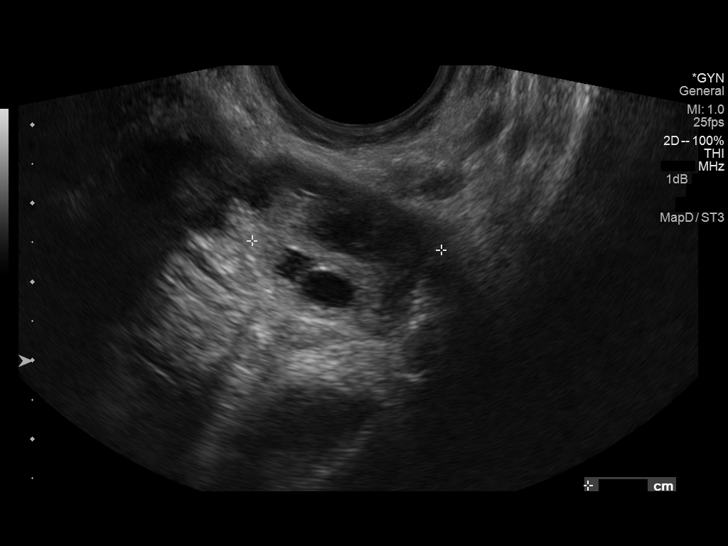
[im 57/57]
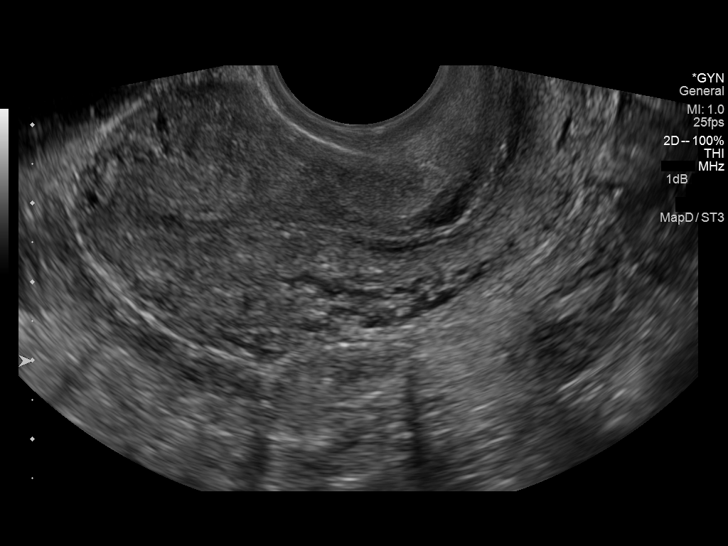

[14 of 25 positions shown; findings below may reference images not displayed]

FINDINGS: Uterus

Measurements: 5.9 x 3.4 x 4.4 cm. Heterogeneous myometrial
echogenicity without focal mass.

Endometrium

Thickness: 6 mm thick, normal. No endometrial fluid or focal
abnormality.

Right ovary

Measurements: 3.0 x 2.4 x 2.1 cm. Normal morphology without mass.
Internal blood flow present on color Doppler imaging.

Left ovary

Measurements: 3.4 x 2.1 x 2.4 cm. Normal morphology without mass.
Internal blood flow present on color Doppler imaging.

Other findings

No adnexal masses or free pelvic fluid.
IMPRESSION: Normal exam.

## 2016-10-24 ENCOUNTER — Inpatient Hospital Stay (HOSPITAL_COMMUNITY)
Admission: AD | Admit: 2016-10-24 | Discharge: 2016-10-24 | Discharge status: Against Medical Advice | Payer: Medicaid Other | Source: Ambulatory Visit | Attending: Obstetrics and Gynecology | Admitting: Obstetrics and Gynecology

## 2016-10-24 NOTE — MAU Note (Signed)
#  2 not in lobby 

## 2016-10-24 NOTE — MAU Note (Signed)
Not in lobby

## 2016-10-24 NOTE — MAU Note (Signed)
Not in lobby #3 

## 2016-12-07 ENCOUNTER — Encounter (HOSPITAL_COMMUNITY): Payer: Self-pay

## 2016-12-07 ENCOUNTER — Inpatient Hospital Stay (HOSPITAL_COMMUNITY)
Admission: AD | Admit: 2016-12-07 | Discharge: 2016-12-07 | Disposition: A | Discharge status: Home / Self Care | Payer: Medicaid Other | Source: Ambulatory Visit | Attending: Obstetrics & Gynecology | Admitting: Obstetrics & Gynecology

## 2016-12-07 DIAGNOSIS — R109 Unspecified abdominal pain: Secondary | ICD-10-CM | POA: Diagnosis not present

## 2016-12-07 DIAGNOSIS — F17200 Nicotine dependence, unspecified, uncomplicated: Secondary | ICD-10-CM | POA: Diagnosis not present

## 2016-12-07 DIAGNOSIS — R102 Pelvic and perineal pain: Secondary | ICD-10-CM | POA: Insufficient documentation

## 2016-12-07 DIAGNOSIS — Z3202 Encounter for pregnancy test, result negative: Secondary | ICD-10-CM | POA: Insufficient documentation

## 2016-12-07 LAB — URINALYSIS, ROUTINE W REFLEX MICROSCOPIC
Bilirubin Urine: NEGATIVE
Glucose, UA: NEGATIVE mg/dL
HGB URINE DIPSTICK: NEGATIVE
Ketones, ur: NEGATIVE mg/dL
Leukocytes, UA: NEGATIVE
Nitrite: NEGATIVE
PROTEIN: NEGATIVE mg/dL
Specific Gravity, Urine: 1.02 (ref 1.005–1.030)
pH: 6 (ref 5.0–8.0)

## 2016-12-07 LAB — WET PREP, GENITAL
Sperm: NONE SEEN
TRICH WET PREP: NONE SEEN
Yeast Wet Prep HPF POC: NONE SEEN

## 2016-12-07 LAB — CBC
HCT: 36.2 % (ref 36.0–46.0)
HEMOGLOBIN: 12.1 g/dL (ref 12.0–15.0)
MCH: 28.7 pg (ref 26.0–34.0)
MCHC: 33.4 g/dL (ref 30.0–36.0)
MCV: 86 fL (ref 78.0–100.0)
Platelets: 193 10*3/uL (ref 150–400)
RBC: 4.21 MIL/uL (ref 3.87–5.11)
RDW: 13 % (ref 11.5–15.5)
WBC: 4.3 10*3/uL (ref 4.0–10.5)

## 2016-12-07 LAB — HCG, SERUM, QUALITATIVE: PREG SERUM: NEGATIVE

## 2016-12-07 LAB — POCT PREGNANCY, URINE: PREG TEST UR: NEGATIVE

## 2016-12-07 NOTE — MAU Provider Note (Signed)
History     CSN: [id number]  Arrival date and time: 12/07/16 2031  First Provider Initiated Contact with Patient 12/07/16 2153      Chief Complaint  Patient presents with  . Pelvic Pain   HPI NEVAEN TREDWAY is a 21 y.o. female who presents with abdominal pain. Reports lower abdominal pain that is worse in RLQ since the beginning of September. Has not contacted her PCP about pain & states nothing has changed tonight. Rates pain 7/10. Took ibuprofen without relief. Nothing makes pain better or worse. Endorses nausea, no vomiting. Denies fever/chills, change in appetite, dysuria, vaginal discharge, diarrhea/constipation. Patient is sexually active with 1 partner & does not use condoms. Reports pink spotting intermittently since last depo injection 6+ months ago.   History reviewed. No pertinent past medical history.  History reviewed. No pertinent surgical history.  Family History  Problem Relation Age of Onset  . Hypertension Mother     Social History  Substance Use Topics  . Smoking status: Current Some Day Smoker  . Smokeless tobacco: Never Used  . Alcohol use No    Allergies: No Known Allergies  Prescriptions Prior to Admission  Medication Sig Dispense Refill Last Dose  . ibuprofen (ADVIL,MOTRIN) 600 MG tablet Take 1 tablet (600 mg total) by mouth every 6 (six) hours. 30 tablet 0   . IRON PO Take 1 tablet by mouth daily.   05/20/2015 at Unknown time  . Prenatal Vit-Fe Fumarate-FA (PRENATAL MULTIVITAMIN) TABS tablet Take 1 tablet by mouth daily at 12 noon.   Past Week at Unknown time    Review of Systems  Constitutional: Negative.   Gastrointestinal: Positive for abdominal pain and nausea. Negative for constipation, diarrhea and vomiting.  Genitourinary: Positive for vaginal bleeding. Negative for dyspareunia, dysuria and vaginal discharge.   Physical Exam   Blood pressure 121/70, pulse 81, temperature 98.4 F (36.9 C), temperature source Oral, resp. rate 16, height  5' 4.5" (1.638 m), weight 136 lb (61.7 kg), SpO2 100 %, unknown if currently breastfeeding.  Physical Exam  Nursing note and vitals reviewed. Constitutional: She is oriented to person, place, and time. She appears well-developed and well-nourished. No distress.  HENT:  Head: Normocephalic and atraumatic.  Eyes: Conjunctivae are normal. Right eye exhibits no discharge. Left eye exhibits no discharge. No scleral icterus.  Neck: Normal range of motion.  Cardiovascular: Normal rate, regular rhythm and normal heart sounds.   No murmur heard. Respiratory: Effort normal and breath sounds normal. No respiratory distress. She has no wheezes.  GI: Soft. Bowel sounds are normal. She exhibits no distension. There is no tenderness. There is no rebound and no guarding.  Genitourinary: Uterus normal. Cervix exhibits no motion tenderness. Right adnexum displays no mass and no tenderness. Left adnexum displays no mass and no tenderness.  Neurological: She is alert and oriented to person, place, and time.  Skin: Skin is warm and dry. She is not diaphoretic.  Psychiatric: She has a normal mood and affect. Her behavior is normal. Judgment and thought content normal.    MAU Course  Procedures Results for orders placed or performed during the hospital encounter of 12/07/16 (from the past 24 hour(s))  Urinalysis, Routine w reflex microscopic     Status: None   Collection Time: 12/07/16  8:49 PM  Result Value Ref Range   Color, Urine YELLOW YELLOW   APPearance CLEAR CLEAR   Specific Gravity, Urine 1.020 1.005 - 1.030   pH 6.0 5.0 - 8.0   Glucose,  UA NEGATIVE NEGATIVE mg/dL   Hgb urine dipstick NEGATIVE NEGATIVE   Bilirubin Urine NEGATIVE NEGATIVE   Ketones, ur NEGATIVE NEGATIVE mg/dL   Protein, ur NEGATIVE NEGATIVE mg/dL   Nitrite NEGATIVE NEGATIVE   Leukocytes, UA NEGATIVE NEGATIVE  Pregnancy, urine POC     Status: None   Collection Time: 12/07/16  9:12 PM  Result Value Ref Range   Preg Test, Ur  NEGATIVE NEGATIVE  Wet prep, genital     Status: Abnormal   Collection Time: 12/07/16 10:05 PM  Result Value Ref Range   Yeast Wet Prep HPF POC NONE SEEN NONE SEEN   Trich, Wet Prep NONE SEEN NONE SEEN   Clue Cells Wet Prep HPF POC PRESENT (A) NONE SEEN   WBC, Wet Prep HPF POC FEW (A) NONE SEEN   Sperm NONE SEEN   CBC     Status: None   Collection Time: 12/07/16 10:09 PM  Result Value Ref Range   WBC 4.3 4.0 - 10.5 K/uL   RBC 4.21 3.87 - 5.11 MIL/uL   Hemoglobin 12.1 12.0 - 15.0 g/dL   HCT 40.9 81.1 - 91.4 %   MCV 86.0 78.0 - 100.0 fL   MCH 28.7 26.0 - 34.0 pg   MCHC 33.4 30.0 - 36.0 g/dL   RDW 78.2 95.6 - 21.3 %   Platelets 193 150 - 400 K/uL  hCG, serum, qualitative     Status: None   Collection Time: 12/07/16 10:09 PM  Result Value Ref Range   Preg, Serum NEGATIVE NEGATIVE    MDM UPT negative Wet prep with clues -- pt denies discharge or odor, declines tx VSS, NAD Assessment and Plan  A;. 1. Abdominal pain in female   2. Pregnancy examination or test, negative result    P: Discharge home F/u with PCP as scheduled Discussed reasons to return to MAU vs ED vs urgent care GC/CT pending  Judeth Horn 12/07/2016, 9:52 PM

## 2016-12-07 NOTE — Discharge Instructions (Signed)
In late 2019, the [facility] will be moving to the [facility] campus. At that time, the MAU will no longer serve non-pregnant patients. We encourage you to establish care with a provider before that time, so that you can be seen with any GYN concerns, like vaginal discharge, urinary tract infection, etc.. in a timely manner. In order to make the office visit more convenient, the Center for [facility] at [facility] will be offering evening hours from 4pm-8pm on Mondays starting 11/14/16. There will be same-day appointments, walk-in appointments and scheduled appointments available during this time.    Center for [facility] @ [facility] [phone]  For urgent needs, [facility] is also available for management of urgent GYN complaints such as vaginal discharge.   [employer] extends eligibility for family planning services to reduce unintended pregnancies and improve the well-being of children and families.   Eligible individuals whose income is at or below 195% of the federal poverty level and who are:  - [place] citizens, documented immigrants or qualified aliens;  - Residents of [place];  - Not incarcerated; and  - Not pregnant.   [employer] Family Planning Contact Information:  Medical Assistance Clinical Section Phone: 6021601171 Email: [email or web address].[email or web address] [email or web address]/      Abdominal Pain, Adult Abdominal pain can be caused by many things. Often, abdominal pain is not serious and it gets better with no treatment or by being treated at home. However, sometimes abdominal pain is serious. Your health care provider will do a medical history and a physical exam to try to determine the cause of your abdominal pain. Follow these instructions at home:  Take over-the-counter and prescription medicines only as told by your health care provider. Do not take a laxative unless told by your health care provider.  Drink enough fluid to  keep your urine clear or pale yellow.  Watch your condition for any changes.  Keep all follow-up visits as told by your health care provider. This is important. Contact a health care provider if:  Your abdominal pain changes or gets worse.  You are not hungry or you lose weight without trying.  You are constipated or have diarrhea for more than 2-3 days.  You have pain when you urinate or have a bowel movement.  Your abdominal pain wakes you up at night.  Your pain gets worse with meals, after eating, or with certain foods.  You are throwing up and cannot keep anything down.  You have a fever. Get help right away if:  Your pain does not go away as soon as your health care provider told you to expect.  You cannot stop throwing up.  Your pain is only in areas of the abdomen, such as the right side or the left lower portion of the abdomen.  You have bloody or black stools, or stools that look like tar.  You have severe pain, cramping, or bloating in your abdomen.  You have signs of dehydration, such as: ? Dark urine, very little urine, or no urine. ? Cracked lips. ? Dry mouth. ? Sunken eyes. ? Sleepiness. ? Weakness. This information is not intended to replace advice given to you by your health care provider. Make sure you discuss any questions you have with your health care provider. Document Released: 12/01/2004 Document Revised: 09/11/2015 Document Reviewed: 08/05/2015 Elsevier Interactive Patient Education  2017 [employer].

## 2016-12-07 NOTE — MAU Note (Signed)
Pt c/o sharp lower abdominal pain and cramping that started the beginning of September. States she has irregular periods, had some "light bleeding on and off". States she took ibuprofen twice since the pain started, but none recently. States shad a negative pregnancy test at home last week.

## 2016-12-08 LAB — GC/CHLAMYDIA PROBE AMP (~~LOC~~) NOT AT ARMC
Chlamydia: NEGATIVE
Neisseria Gonorrhea: NEGATIVE

## 2017-05-25 ENCOUNTER — Ambulatory Visit (HOSPITAL_COMMUNITY)
Admission: EM | Admit: 2017-05-25 | Discharge: 2017-05-25 | Disposition: A | Discharge status: Home / Self Care | Payer: Medicaid Other | Attending: Family Medicine | Admitting: Family Medicine

## 2017-05-25 ENCOUNTER — Encounter (HOSPITAL_COMMUNITY): Payer: Self-pay | Admitting: Emergency Medicine

## 2017-05-25 ENCOUNTER — Other Ambulatory Visit: Payer: Self-pay

## 2017-05-25 DIAGNOSIS — Z8249 Family history of ischemic heart disease and other diseases of the circulatory system: Secondary | ICD-10-CM | POA: Diagnosis not present

## 2017-05-25 DIAGNOSIS — F17200 Nicotine dependence, unspecified, uncomplicated: Secondary | ICD-10-CM | POA: Diagnosis not present

## 2017-05-25 DIAGNOSIS — Z79899 Other long term (current) drug therapy: Secondary | ICD-10-CM | POA: Insufficient documentation

## 2017-05-25 DIAGNOSIS — R109 Unspecified abdominal pain: Secondary | ICD-10-CM | POA: Diagnosis not present

## 2017-05-25 DIAGNOSIS — N926 Irregular menstruation, unspecified: Secondary | ICD-10-CM | POA: Diagnosis not present

## 2017-05-25 DIAGNOSIS — Z791 Long term (current) use of non-steroidal anti-inflammatories (NSAID): Secondary | ICD-10-CM | POA: Insufficient documentation

## 2017-05-25 HISTORY — DX: Iron deficiency: E61.1

## 2017-05-25 LAB — POCT URINALYSIS DIP (DEVICE)
BILIRUBIN URINE: NEGATIVE
GLUCOSE, UA: NEGATIVE mg/dL
KETONES UR: NEGATIVE mg/dL
Leukocytes, UA: NEGATIVE
Nitrite: NEGATIVE
Protein, ur: NEGATIVE mg/dL
SPECIFIC GRAVITY, URINE: 1.025 (ref 1.005–1.030)
Urobilinogen, UA: 0.2 mg/dL (ref 0.0–1.0)
pH: 7 (ref 5.0–8.0)

## 2017-05-25 LAB — POCT PREGNANCY, URINE: PREG TEST UR: NEGATIVE

## 2017-05-25 NOTE — Discharge Instructions (Signed)
Use of ibuprofen as needed for pain/cramping. Please follow up with your gynecologist for further evaluation of irregular periods.  Will notify you of any positive findings from your vaginal testing and if any changes to treatment are needed.   Negative today for urinary tract infection and pregnancy.

## 2017-05-25 NOTE — ED Provider Notes (Signed)
[facility]    CSN: [id number] Arrival date & time: 05/25/17  1012     History   Chief Complaint Chief Complaint  Patient presents with  . Abdominal Pain    HPI Christy Gonzalez is a 22 y.o. female.   Christy Gonzalez presents with complaints of intermittent abdominal cramping which has been off and on for months. She states she has been trying to conceive. Periods have been irregular with spotting primarily. Had been on depo. Last month with two episodes of spotting. This month had spotting start two days ago. This morning had cramping which has improved after taking tylenol. Slight white vaginal discharge. Without itching or pain. Normal BM's. Denies urinary symptoms. Has two living children, two previous pregnancies. Without contributing medical history.    ROS per HPI.       Past Medical History:  Diagnosis Date  . Iron deficiency     Patient Active Problem List   Diagnosis Date Noted  . Spontaneous vaginal delivery 05/21/2015  . Rubella non-immune status, antepartum 05/21/2015  . Anemia of pregnancy 05/21/2015    History reviewed. No pertinent surgical history.  OB History    Gravida  2   Para  2   Term  2   Preterm      AB      Living  2     SAB      TAB      Ectopic      Multiple  0   Live Births  2            Home Medications    Prior to Admission medications   Medication Sig Start Date End Date Taking? Authorizing Provider  acetaminophen (TYLENOL) 325 MG tablet Take 650 mg by mouth every 6 (six) hours as needed.   Yes [provider]  ibuprofen (ADVIL,MOTRIN) 600 MG tablet Take 1 tablet (600 mg total) by mouth every 6 (six) hours. 05/22/15   Standard, Venus, CNM  IRON PO Take 1 tablet by mouth daily.    [provider]  Prenatal Vit-Fe Fumarate-FA (PRENATAL MULTIVITAMIN) TABS tablet Take 1 tablet by mouth daily at 12 noon.    [provider]    Family History Family History  Problem Relation Age  of Onset  . Hypertension Mother     Social History Social History   Tobacco Use  . Smoking status: Current Some Day Smoker  . Smokeless tobacco: Never Used  Substance Use Topics  . Alcohol use: No  . Drug use: Yes    Types: Marijuana     Allergies   Patient has no known allergies.   Review of Systems Review of Systems   Physical Exam Triage Vital Signs ED Triage Vitals  Enc Vitals Group     BP 05/25/17 1049 121/84     Pulse Rate 05/25/17 1049 (!) 53     Resp --      Temp 05/25/17 1049 98.1 F (36.7 C)     Temp Source 05/25/17 1049 Oral     SpO2 05/25/17 1049 96 %     Weight --      Height --      Head Circumference --      Peak Flow --      Pain Score 05/25/17 1052 7     Pain Loc --      Pain Edu? --      Excl. in GC? --    No data found.  Updated  Vital Signs BP 121/84 (BP Location: Left Arm)   Pulse (!) 53   Temp 98.1 F (36.7 C) (Oral)   SpO2 96%   Visual Acuity Right Eye Distance:   Left Eye Distance:   Bilateral Distance:    Right Eye Near:   Left Eye Near:    Bilateral Near:     Physical Exam  Constitutional: She is oriented to person, place, and time. She appears well-developed and well-nourished. No distress.  Cardiovascular: Normal rate, regular rhythm and normal heart sounds.  Pulmonary/Chest: Effort normal and breath sounds normal.  Abdominal: There is no tenderness.  Genitourinary: Uterus normal. Cervix exhibits no motion tenderness, no discharge and no friability. Right adnexum displays no tenderness. Left adnexum displays no tenderness. There is bleeding in the vagina.  Neurological: She is alert and oriented to person, place, and time.  Skin: Skin is warm and dry.     UC Treatments / Results  Labs (all labs ordered are listed, but only abnormal results are displayed) Labs Reviewed  POCT URINALYSIS DIP (DEVICE) - Abnormal; Notable for the following components:      Result Value   Hgb urine dipstick TRACE (*)    All other  components within normal limits  POCT PREGNANCY, URINE  CERVICOVAGINAL ANCILLARY ONLY    EKG  EKG Interpretation None       Radiology No results found.  Procedures Procedures (including critical care time)  Medications Ordered in UC Medications - No data to display   Initial Impression / Assessment and Plan / UC Course  I have reviewed the triage vital signs and the nursing notes.  Pertinent labs & imaging results that were available during my care of the patient were reviewed by me and considered in my medical decision making (see chart for details).     Patient states she is concerned a possible infection is inhibiting her ability to get pregnant and would like to be evaluated. Urine dip negative, vaginal swab collected and pending at this time. Cramping and bleeding appears consistent with menstruation. Currently without pain, without pain or tenderness on pelvic exam. Low suspicion for ectopic pregnancy. Periods have been irregular s/p depo. Encouraged follow up with gynecology for further discussion and management of irregular periods. Patient verbalized understanding and agreeable to plan.    Final Clinical Impressions(s) / UC Diagnoses   Final diagnoses:  Abdominal cramping  Irregular periods/menstrual cycles    ED Discharge Orders    None       Controlled Substance Prescriptions [place] Controlled Substance Registry consulted? Not Applicable   Georgetta Haber, NP 05/25/17 1137

## 2017-05-25 NOTE — ED Triage Notes (Signed)
Pt states trying to conceive and last injection of BC- Dpo Provera in June 2018. C/o abdominal cramping with irregular menses since. Denies any vaginal d/c or odor

## 2017-05-25 NOTE — ED Notes (Signed)
[name]

## 2017-05-26 LAB — CERVICOVAGINAL ANCILLARY ONLY
BACTERIAL VAGINITIS: POSITIVE — AB
CANDIDA VAGINITIS: NEGATIVE
CHLAMYDIA, DNA PROBE: NEGATIVE
NEISSERIA GONORRHEA: NEGATIVE
TRICH (WINDOWPATH): POSITIVE — AB

## 2017-05-27 ENCOUNTER — Telehealth: Payer: Self-pay | Admitting: Internal Medicine

## 2017-05-27 MED ORDER — METRONIDAZOLE 500 MG PO TABS: 500.0000 mg | ORAL_TABLET | Freq: Two times a day (BID) | ORAL | 0 refills | Status: DC

## 2017-05-27 NOTE — Telephone Encounter (Signed)
Clinical staff, please let patient know that test for trichomonas was positive.  Rx metronidazole was sent to the pharmacy of record, [facility] [address] [facility]  Please refrain from sexual intercourse for 7 days to give the medicine time to work.  Sexual partners need to be notified and tested/treated.  Condoms may reduce risk of reinfection.   Test for gardnerella (bacterial vaginosis) was also positive.  Prescription for metronidazole will cover it.  Recheck for further evaluation if symptoms are not improving.  Ria ClockLaura Abdel Effinger MD

## 2017-06-06 ENCOUNTER — Telehealth (HOSPITAL_COMMUNITY): Payer: Self-pay

## 2017-06-06 NOTE — Telephone Encounter (Signed)
Pt contacted regarding test results from recent visit. Pt instructed on need for new medication sent pharmacy. Educated patient on safe sex practices.  Pt given resources for GoodRx.com for coupon for prescription, pt is appreciative. Verbalized understanding.

## 2017-11-16 ENCOUNTER — Ambulatory Visit (HOSPITAL_COMMUNITY): Admission: RE | Admit: 2017-11-16 | Payer: Medicaid Other | Source: Ambulatory Visit

## 2017-11-16 ENCOUNTER — Inpatient Hospital Stay (HOSPITAL_COMMUNITY)
Admission: AD | Admit: 2017-11-16 | Discharge: 2017-11-16 | Disposition: A | Discharge status: Home / Self Care | Payer: Medicaid Other | Source: Ambulatory Visit | Attending: Obstetrics and Gynecology | Admitting: Obstetrics and Gynecology

## 2017-11-16 ENCOUNTER — Encounter (HOSPITAL_COMMUNITY): Payer: Self-pay | Admitting: *Deleted

## 2017-11-16 DIAGNOSIS — O26899 Other specified pregnancy related conditions, unspecified trimester: Secondary | ICD-10-CM

## 2017-11-16 DIAGNOSIS — Z3A01 Less than 8 weeks gestation of pregnancy: Secondary | ICD-10-CM

## 2017-11-16 DIAGNOSIS — R109 Unspecified abdominal pain: Secondary | ICD-10-CM | POA: Diagnosis not present

## 2017-11-16 LAB — URINALYSIS, ROUTINE W REFLEX MICROSCOPIC
BACTERIA UA: NONE SEEN
BILIRUBIN URINE: NEGATIVE
Glucose, UA: NEGATIVE mg/dL
Hgb urine dipstick: NEGATIVE
Ketones, ur: 5 mg/dL — AB
Nitrite: NEGATIVE
Protein, ur: 100 mg/dL — AB
Specific Gravity, Urine: 1.027 (ref 1.005–1.030)
WBC, UA: >50 WBC/hpf — ABNORMAL HIGH (ref 0–5)
pH: 7 (ref 5.0–8.0)

## 2017-11-16 LAB — CBC
HEMATOCRIT: 33.7 % — AB (ref 36.0–46.0)
Hemoglobin: 11.4 g/dL — ABNORMAL LOW (ref 12.0–15.0)
MCH: 28.9 pg (ref 26.0–34.0)
MCHC: 33.8 g/dL (ref 30.0–36.0)
MCV: 85.5 fL (ref 78.0–100.0)
Platelets: 192 10*3/uL (ref 150–400)
RBC: 3.94 MIL/uL (ref 3.87–5.11)
RDW: 12.5 % (ref 11.5–15.5)
WBC: 4.8 10*3/uL (ref 4.0–10.5)

## 2017-11-16 LAB — HCG, QUANTITATIVE, PREGNANCY: hCG, Beta Chain, Quant, S: 28966 m[IU]/mL — ABNORMAL HIGH (ref <5)

## 2017-11-16 LAB — POCT PREGNANCY, URINE: PREG TEST UR: POSITIVE — AB

## 2017-11-16 NOTE — MAU Note (Signed)
PT SAYS SHE HAS 2 CYCLES IN July  THEN HAD SPOTTING IN AUG  , NOW NO CYCLE.   NO HPT.  NO BIRTH CONTROL.    CRAMPS  STARTED 9-4- COME/ GOES.- TOOK TYLENOL YESTERDAY.  BACK HURT YESTERDAY - NONE NOW.

## 2017-11-17 LAB — HIV ANTIBODY (ROUTINE TESTING W REFLEX): HIV Screen 4th Generation wRfx: NONREACTIVE

## 2017-11-18 ENCOUNTER — Inpatient Hospital Stay (HOSPITAL_COMMUNITY): Payer: Medicaid Other

## 2017-11-18 ENCOUNTER — Encounter (HOSPITAL_COMMUNITY): Payer: Self-pay | Admitting: *Deleted

## 2017-11-18 ENCOUNTER — Inpatient Hospital Stay (HOSPITAL_COMMUNITY)
Admission: AD | Admit: 2017-11-18 | Discharge: 2017-11-18 | Disposition: A | Discharge status: Home / Self Care | Payer: Medicaid Other | Source: Ambulatory Visit | Attending: Obstetrics and Gynecology | Admitting: Obstetrics and Gynecology

## 2017-11-18 DIAGNOSIS — O26891 Other specified pregnancy related conditions, first trimester: Secondary | ICD-10-CM | POA: Diagnosis not present

## 2017-11-18 DIAGNOSIS — Z791 Long term (current) use of non-steroidal anti-inflammatories (NSAID): Secondary | ICD-10-CM | POA: Diagnosis not present

## 2017-11-18 DIAGNOSIS — O418X10 Other specified disorders of amniotic fluid and membranes, first trimester, not applicable or unspecified: Secondary | ICD-10-CM

## 2017-11-18 DIAGNOSIS — O99331 Smoking (tobacco) complicating pregnancy, first trimester: Secondary | ICD-10-CM | POA: Insufficient documentation

## 2017-11-18 DIAGNOSIS — O208 Other hemorrhage in early pregnancy: Secondary | ICD-10-CM | POA: Diagnosis not present

## 2017-11-18 DIAGNOSIS — F17290 Nicotine dependence, other tobacco product, uncomplicated: Secondary | ICD-10-CM | POA: Insufficient documentation

## 2017-11-18 DIAGNOSIS — Z3A01 Less than 8 weeks gestation of pregnancy: Secondary | ICD-10-CM | POA: Diagnosis not present

## 2017-11-18 DIAGNOSIS — N930 Postcoital and contact bleeding: Secondary | ICD-10-CM

## 2017-11-18 DIAGNOSIS — O4691 Antepartum hemorrhage, unspecified, first trimester: Secondary | ICD-10-CM

## 2017-11-18 DIAGNOSIS — Z79899 Other long term (current) drug therapy: Secondary | ICD-10-CM | POA: Insufficient documentation

## 2017-11-18 DIAGNOSIS — R109 Unspecified abdominal pain: Secondary | ICD-10-CM | POA: Insufficient documentation

## 2017-11-18 DIAGNOSIS — O468X1 Other antepartum hemorrhage, first trimester: Secondary | ICD-10-CM

## 2017-11-18 DIAGNOSIS — Z3491 Encounter for supervision of normal pregnancy, unspecified, first trimester: Secondary | ICD-10-CM

## 2017-11-18 DIAGNOSIS — O4690 Antepartum hemorrhage, unspecified, unspecified trimester: Secondary | ICD-10-CM

## 2017-11-18 HISTORY — DX: Anemia, unspecified: D64.9

## 2017-11-18 LAB — URINALYSIS, ROUTINE W REFLEX MICROSCOPIC
Bilirubin Urine: NEGATIVE
Glucose, UA: NEGATIVE mg/dL
Hgb urine dipstick: NEGATIVE
Ketones, ur: NEGATIVE mg/dL
Nitrite: NEGATIVE
Protein, ur: 30 mg/dL — AB
Specific Gravity, Urine: 1.025 (ref 1.005–1.030)
pH: 6 (ref 5.0–8.0)

## 2017-11-18 LAB — WET PREP, GENITAL
Clue Cells Wet Prep HPF POC: NONE SEEN
Sperm: NONE SEEN
Trich, Wet Prep: NONE SEEN
Yeast Wet Prep HPF POC: NONE SEEN

## 2017-11-18 LAB — HCG, QUANTITATIVE, PREGNANCY: hCG, Beta Chain, Quant, S: 33877 m[IU]/mL — ABNORMAL HIGH (ref <5)

## 2017-11-18 NOTE — MAU Note (Signed)
Was here the other day and had to leave from lobby before getting to a room  Had IC last night and is now having some spotting, states bleeding was heavier last night and this AM it was just on the toilet paper  Having intermittent cramping, none now

## 2017-11-18 NOTE — MAU Provider Note (Signed)
History     CSN: [id number]  Arrival date and time: 11/18/17 1554   First Provider Initiated Contact with Patient 11/18/17 1633      Chief Complaint  Patient presents with  . Abdominal Pain  . Vaginal Bleeding   Christy Gonzalez is a 22 y.o. G3P2 at [redacted]w[redacted]d by LMP who presents to MAU with complaints of vaginal bleeding and abdominal pain. Was seen in MAU on 9/12 for abdominal pain but had to leave prior to completion of visit because she had to go to work. She reports abdominal pain has been present since visit, describes pain as intermittent lower abdominal cramping- denies abdominal pain currently. She reports vaginal bleeding that started last night after intercourse, describes bleeding as bright red vaginal bleeding that she had to change 1 pad for last night. This morning bleeding lightened and now it is spotting when she wipes. She reports abdominal cramping increased directly after intercourse when bleeding increased. She denies vaginal discharge, vaginal itching, urinary symptoms or N/V.    OB History    Gravida  3   Para  2   Term  2   Preterm      AB      Living  2     SAB      TAB      Ectopic      Multiple  0   Live Births  2           Past Medical History:  Diagnosis Date  . Anemia   . Iron deficiency     Past Surgical History:  Procedure Laterality Date  . NO PAST SURGERIES      Family History  Problem Relation Age of Onset  . Hypertension Mother     Social History   Tobacco Use  . Smoking status: Current Some Day Smoker    Types: Cigars  . Smokeless tobacco: Never Used  Substance Use Topics  . Alcohol use: No  . Drug use: Yes    Types: Marijuana    Comment: 1 month ago    Allergies: No Known Allergies  Medications Prior to Admission  Medication Sig Dispense Refill Last Dose  . acetaminophen (TYLENOL) 325 MG tablet Take 650 mg by mouth every 6 (six) hours as needed.     Marland Kitchen ibuprofen (ADVIL,MOTRIN) 600 MG tablet Take 1 tablet  (600 mg total) by mouth every 6 (six) hours. 30 tablet 0   . IRON PO Take 1 tablet by mouth daily.   05/20/2015 at Unknown time  . metroNIDAZOLE (FLAGYL) 500 MG tablet Take 1 tablet (500 mg total) by mouth 2 (two) times daily. For tests positive for trichomonas and BV 14 tablet 0   . Prenatal Vit-Fe Fumarate-FA (PRENATAL MULTIVITAMIN) TABS tablet Take 1 tablet by mouth daily at 12 noon.   Past Week at Unknown time    Review of Systems  Constitutional: Negative.   Respiratory: Negative.   Cardiovascular: Negative.   Gastrointestinal: Positive for abdominal pain. Negative for constipation, diarrhea, nausea and vomiting.  Genitourinary: Positive for vaginal bleeding. Negative for difficulty urinating, dysuria, frequency, urgency, vaginal discharge and vaginal pain.  Neurological: Negative.    Physical Exam   Blood pressure 134/76, pulse 69, temperature (!) 97.4 F (36.3 C), temperature source Oral, resp. rate 18, weight 59 kg, last menstrual period 10/09/2017, unknown if currently breastfeeding.  Physical Exam  Nursing note and vitals reviewed. Constitutional: She is oriented to person, place, and time. She appears well-developed and well-nourished.  HENT:  Head: Normocephalic.  Cardiovascular: Normal rate, regular rhythm and normal heart sounds.  Respiratory: Effort normal and breath sounds normal. No respiratory distress. She has no wheezes.  GI: Soft. Bowel sounds are normal. She exhibits no distension. There is no tenderness. There is no rebound.  Genitourinary: Cervix exhibits no motion tenderness. There is bleeding in the vagina. No tenderness in the vagina.  Genitourinary Comments: Pelvic exam: cervix pink, visually closed, small bright red vaginal bleeding present around cervix (2 faux swabs needed)  Bimanual exam: neg CMT, uterus non tender, no mass or abnormalities of adnexa palpated, cervix closed   Musculoskeletal: Normal range of motion. She exhibits no edema.  Neurological:  She is alert and oriented to person, place, and time.  Psychiatric: She has a normal mood and affect. Her behavior is normal. Thought content normal.    MAU Course  Procedures  MDM Orders Placed This Encounter  Procedures  . Wet prep, genital  . US OB LESS THAN 14 WEEKS WITH OB TRANSVAGINAL  . hCG, quantitative, pregnancy  . Urinalysis, Routine w reflex microscopic   Labs reviewed:  Results for orders placed or performed during the hospital encounter of 11/18/17 (from the past 72 hour(s))  Urinalysis, Routine w reflex microscopic     Status: Abnormal   Collection Time: 11/18/17  4:36 PM  Result Value Ref Range   Color, Urine YELLOW YELLOW   APPearance HAZY (A) CLEAR   Specific Gravity, Urine 1.025 1.005 - 1.030   pH 6.0 5.0 - 8.0   Glucose, UA NEGATIVE NEGATIVE mg/dL   Hgb urine dipstick NEGATIVE NEGATIVE   Bilirubin Urine NEGATIVE NEGATIVE   Ketones, ur NEGATIVE NEGATIVE mg/dL   Protein, ur 30 (A) NEGATIVE mg/dL   Nitrite NEGATIVE NEGATIVE   Leukocytes, UA TRACE (A) NEGATIVE   RBC / HPF 6-10 0 - 5 RBC/hpf   WBC, UA 21-50 0 - 5 WBC/hpf   Bacteria, UA RARE (A) NONE SEEN   Squamous Epithelial / LPF 0-5 0 - 5   Mucus PRESENT     Comment: Performed at [facility], [address], [place], [place] [address]  Wet prep, genital     Status: Abnormal   Collection Time: 11/18/17  4:41 PM  Result Value Ref Range   Yeast Wet Prep HPF POC NONE SEEN NONE SEEN   Trich, Wet Prep NONE SEEN NONE SEEN   Clue Cells Wet Prep HPF POC NONE SEEN NONE SEEN   WBC, Wet Prep HPF POC FEW (A) NONE SEEN    Comment: MANY BACTERIA SEEN   Sperm NONE SEEN     Comment: Performed at [facility], [address] [place], [place] [address]  hCG, quantitative, pregnancy     Status: Abnormal   Collection Time: 11/18/17  5:06 PM  Result Value Ref Range   hCG, Beta Chain, Quant, S 33,877 (H) <5 mIU/mL   Wet prep- negative  GC/C- pending   US Ob Less Than 14 Weeks With Ob Transvaginal  Result  Date: 11/18/2017 CLINICAL DATA:  Pregnant, vaginal bleeding EXAM: OBSTETRIC <14 WK [place] AND TRANSVAGINAL OB US TECHNIQUE: Both transabdominal and transvaginal ultrasound examinations were performed for complete evaluation of the gestation as well as the maternal uterus, adnexal regions, and pelvic cul-de-sac. Transvaginal technique was performed to assess early pregnancy. COMPARISON:  None. FINDINGS: Intrauterine gestational sac: Single Yolk sac:  Visualized. Embryo:  Visualized. Cardiac Activity: Visualized. Heart Rate: 124 bpm CRL:  4.2 mm   6 w   1  d                  US EDC: 07/11/2018 Subchorionic hemorrhage:  Large subchronic hemorrhage. Maternal uterus/adnexae: Right ovary is within normal limits. Left ovary is notable for a 4.0 x 4.4 x 4.5 cm simple corpus luteal cyst. No free fluid. IMPRESSION: Single live intrauterine gestation, with estimated gestational age [redacted] weeks 1 day by crown-rump length, as above. Large subchronic hemorrhage. Electronically Signed   By: Charline BillsSriyesh  Krishnan M.D.   On: 11/18/2017 18:26   Labs and US reviewed with patient. Educated and discussed [facility] and what to expect with bleeding. Discussed importance of pelvic rest and no intercourse. Patient verbalizes understanding. Pt stable at time of discharge.   Assessment and Plan   1. Subchorionic hematoma in first trimester, single or unspecified fetus   2. Vaginal bleeding during pregnancy, antepartum   3. Normal intrauterine pregnancy on prenatal ultrasound in first trimester   4. [redacted] weeks gestation of pregnancy    Discharge home  Make appointment to be seen to initiate prenatal care  Return to MAU as needed for increased vaginal bleeding like a period and/or abdominal pain  Pelvic rest  Start taking prenatal vitamins    Sharyon CableVeronica C Aralynn Brake CNM 11/18/2017, 6:44 PM

## 2017-11-20 ENCOUNTER — Encounter (HOSPITAL_COMMUNITY): Payer: Self-pay | Admitting: *Deleted

## 2017-11-20 ENCOUNTER — Other Ambulatory Visit: Payer: Self-pay

## 2017-11-20 ENCOUNTER — Other Ambulatory Visit: Payer: Self-pay | Admitting: Certified Nurse Midwife

## 2017-11-20 ENCOUNTER — Inpatient Hospital Stay (HOSPITAL_COMMUNITY)
Admission: AD | Admit: 2017-11-20 | Discharge: 2017-11-20 | Disposition: A | Discharge status: Home / Self Care | Payer: Medicaid Other | Source: Ambulatory Visit | Attending: Obstetrics & Gynecology | Admitting: Obstetrics & Gynecology

## 2017-11-20 DIAGNOSIS — O418X10 Other specified disorders of amniotic fluid and membranes, first trimester, not applicable or unspecified: Secondary | ICD-10-CM | POA: Diagnosis not present

## 2017-11-20 DIAGNOSIS — O468X1 Other antepartum hemorrhage, first trimester: Secondary | ICD-10-CM

## 2017-11-20 DIAGNOSIS — O200 Threatened abortion: Secondary | ICD-10-CM | POA: Insufficient documentation

## 2017-11-20 DIAGNOSIS — R109 Unspecified abdominal pain: Secondary | ICD-10-CM | POA: Diagnosis present

## 2017-11-20 DIAGNOSIS — O209 Hemorrhage in early pregnancy, unspecified: Secondary | ICD-10-CM | POA: Insufficient documentation

## 2017-11-20 DIAGNOSIS — O26891 Other specified pregnancy related conditions, first trimester: Secondary | ICD-10-CM | POA: Diagnosis present

## 2017-11-20 DIAGNOSIS — N939 Abnormal uterine and vaginal bleeding, unspecified: Secondary | ICD-10-CM | POA: Diagnosis present

## 2017-11-20 DIAGNOSIS — A749 Chlamydial infection, unspecified: Secondary | ICD-10-CM | POA: Insufficient documentation

## 2017-11-20 DIAGNOSIS — Z3A01 Less than 8 weeks gestation of pregnancy: Secondary | ICD-10-CM | POA: Diagnosis not present

## 2017-11-20 DIAGNOSIS — O99331 Smoking (tobacco) complicating pregnancy, first trimester: Secondary | ICD-10-CM | POA: Diagnosis not present

## 2017-11-20 DIAGNOSIS — O98811 Other maternal infectious and parasitic diseases complicating pregnancy, first trimester: Principal | ICD-10-CM

## 2017-11-20 LAB — GC/CHLAMYDIA PROBE AMP (~~LOC~~) NOT AT ARMC
Chlamydia: POSITIVE — AB
Neisseria Gonorrhea: NEGATIVE

## 2017-11-20 MED ORDER — AZITHROMYCIN 500 MG PO TABS: 1000.0000 mg | ORAL_TABLET | Freq: Once | ORAL | 0 refills | Status: AC

## 2017-11-20 NOTE — MAU Note (Signed)
Still having some bleeding. Cramping a little, on and off.

## 2017-11-20 NOTE — Discharge Instructions (Signed)
Subchorionic Hematoma °A subchorionic hematoma is a gathering of blood between the outer wall of the placenta and the inner wall of the womb (uterus). The placenta is the organ that connects the fetus to the wall of the uterus. The placenta performs the feeding, breathing (oxygen to the fetus), and waste removal (excretory work) of the fetus. °Subchorionic hematoma is the most common abnormality found on a result from ultrasonography done during the first trimester or early second trimester of pregnancy. If there has been little or no vaginal bleeding, early small hematomas usually shrink on their own and do not affect your baby or pregnancy. The blood is gradually absorbed over 1-2 weeks. When bleeding starts later in pregnancy or the hematoma is larger or occurs in an older pregnant woman, the outcome may not be as good. Larger hematomas may get bigger, which increases the chances for miscarriage. Subchorionic hematoma also increases the risk of premature detachment of the placenta from the uterus, preterm (premature) labor, and stillbirth. °Follow these instructions at home: °· Stay on bed rest if your health care provider recommends this. Although bed rest will not prevent more bleeding or prevent a miscarriage, your health care provider may recommend bed rest until you are advised otherwise. °· Avoid heavy lifting (more than 10 lb [4.5 kg]), exercise, sexual intercourse, or douching as directed by your health care provider. °· Keep track of the number of pads you use each day and how soaked (saturated) they are. Write down this information. °· Do not use tampons. °· Keep all follow-up appointments as directed by your health care provider. Your health care provider may ask you to have follow-up blood tests or ultrasound tests or both. °Get help right away if: °· You have severe cramps in your stomach, back, abdomen, or pelvis. °· You have a fever. °· You pass large clots or tissue. Save any tissue for your  health care provider to look at. °· Your bleeding increases or you become lightheaded, feel weak, or have fainting episodes. °This information is not intended to replace advice given to you by your health care provider. Make sure you discuss any questions you have with your health care provider. °Document Released: 06/08/2006 Document Revised: 07/30/2015 Document Reviewed: 09/20/2012 °Elsevier Interactive Patient Education © 2017 Elsevier Inc. °Pelvic Rest °Pelvic rest may be recommended if: °· Your placenta is partially or completely covering the opening of your cervix (placenta previa). °· There is bleeding between the wall of the uterus and the amniotic sac in the first trimester of pregnancy (subchorionic hemorrhage). °· You went into labor too early (preterm labor). ° °Based on your overall health and the health of your baby, your health care provider will decide if pelvic rest is right for you. °How do I rest my pelvis? °For as long as told by your health care provider: °· Do not have sex, sexual stimulation, or an orgasm. °· Do not use tampons. Do not douche. Do not put anything in your vagina. °· Do not lift anything that is heavier than 10 lb (4.5 kg). °· Avoid activities that take a lot of effort (are strenuous). °· Avoid any activity in which your pelvic muscles could become strained. ° °When should I seek medical care? °Seek medical care if you have: °· Cramping pain in your lower abdomen. °· Vaginal discharge. °· A low, dull backache. °· Regular contractions. °· Uterine tightening. ° °When should I seek immediate medical care? °Seek immediate medical care if: °· You have vaginal bleeding   and you are pregnant. ° °This information is not intended to replace advice given to you by your health care provider. Make sure you discuss any questions you have with your health care provider. °Document Released: 06/18/2010 Document Revised: 07/30/2015 Document Reviewed: 08/25/2014 °Elsevier Interactive Patient  Education © 2018 Elsevier Inc. ° ° °

## 2017-11-20 NOTE — MAU Provider Note (Signed)
History     CSN: [id number]  Arrival date and time: 11/20/17 1139   First Provider Initiated Contact with Patient 11/20/17 1305      Chief Complaint  Patient presents with  . Vaginal Bleeding  . Abdominal Pain   HPI   [name] is a 22 y.o. female G31P2002 @ [redacted]w[redacted]d here in MAU with ongoing vaginal bleeding. The bleeding started on 9/12. She has been seen in MAU several times for the bleeding. Says the bleeding is heavier than it has been. Says she continues to have abdominal cramping. The cramping is no different than it has been. The pain is in her lower abdomen, the pain comes and goes. Denies dizziness.   OB History    Gravida  3   Para  2   Term  2   Preterm      AB      Living  2     SAB      TAB      Ectopic      Multiple  0   Live Births  2           Past Medical History:  Diagnosis Date  . Anemia   . Iron deficiency     Past Surgical History:  Procedure Laterality Date  . NO PAST SURGERIES      Family History  Problem Relation Age of Onset  . Hypertension Mother     Social History   Tobacco Use  . Smoking status: Current Some Day Smoker    Types: Cigars  . Smokeless tobacco: Never Used  Substance Use Topics  . Alcohol use: No  . Drug use: Yes    Types: Marijuana    Comment: 1 month ago    Allergies: No Known Allergies  Medications Prior to Admission  Medication Sig Dispense Refill Last Dose  . acetaminophen (TYLENOL) 325 MG tablet Take 650 mg by mouth every 6 (six) hours as needed.     . IRON PO Take 1 tablet by mouth daily.   05/20/2015 at Unknown time  . Prenatal Vit-Fe Fumarate-FA (PRENATAL MULTIVITAMIN) TABS tablet Take 1 tablet by mouth daily at 12 noon.   Past Week at Unknown time   Results for orders placed or performed during the hospital encounter of 11/18/17 (from the past 48 hour(s))  Urinalysis, Routine w reflex microscopic     Status: Abnormal   Collection Time: 11/18/17  4:36 PM  Result Value Ref Range    Color, Urine YELLOW YELLOW   APPearance HAZY (A) CLEAR   Specific Gravity, Urine 1.025 1.005 - 1.030   pH 6.0 5.0 - 8.0   Glucose, UA NEGATIVE NEGATIVE mg/dL   Hgb urine dipstick NEGATIVE NEGATIVE   Bilirubin Urine NEGATIVE NEGATIVE   Ketones, ur NEGATIVE NEGATIVE mg/dL   Protein, ur 30 (A) NEGATIVE mg/dL   Nitrite NEGATIVE NEGATIVE   Leukocytes, UA TRACE (A) NEGATIVE   RBC / HPF 6-10 0 - 5 RBC/hpf   WBC, UA 21-50 0 - 5 WBC/hpf   Bacteria, UA RARE (A) NONE SEEN   Squamous Epithelial / LPF 0-5 0 - 5   Mucus PRESENT     Comment: Performed at [facility], [address] [place], [place] [address]  Wet prep, genital     Status: Abnormal   Collection Time: 11/18/17  4:41 PM  Result Value Ref Range   Yeast Wet Prep HPF POC NONE SEEN NONE SEEN   Trich, Wet Prep NONE SEEN  NONE SEEN   Clue Cells Wet Prep HPF POC NONE SEEN NONE SEEN   WBC, Wet Prep HPF POC FEW (A) NONE SEEN    Comment: MANY BACTERIA SEEN   Sperm NONE SEEN     Comment: Performed at [facility], [address] [place], [place] [address]  hCG, quantitative, pregnancy     Status: Abnormal   Collection Time: 11/18/17  5:06 PM  Result Value Ref Range   hCG, Beta Chain, Quant, S 33,877 (H) <5 mIU/mL    Comment:          GEST. AGE      CONC.  (mIU/mL)   <=1 WEEK        5 - 50     2 WEEKS       50 - 500     3 WEEKS       100 - 10,000     4 WEEKS     1,000 - 30,000     5 WEEKS     3,500 - 115,000   6-8 WEEKS     12,000 - 270,000    12 WEEKS     15,000 - 220,000        FEMALE AND NON-PREGNANT FEMALE:     LESS THAN 5 mIU/mL Performed at [facility], [address] [place], [place] [address]     Review of Systems  Constitutional: Negative for fever.  Gastrointestinal: Positive for abdominal pain.  Genitourinary: Positive for vaginal bleeding.   Physical Exam   Blood pressure 116/65, pulse 96, temperature 98.3 F (36.8 C), temperature source Oral, resp. rate 16, weight 59.4 kg, last menstrual period  10/09/2017, SpO2 100 %, unknown if currently breastfeeding.  Physical Exam  Constitutional: She is oriented to person, place, and time. She appears well-developed and well-nourished. No distress.  HENT:  Head: Normocephalic.  Eyes: Pupils are equal, round, and reactive to light.  GI: Soft. She exhibits no distension. There is no tenderness. There is no rebound and no guarding.  Genitourinary:  Genitourinary Comments: Cervix: closed, thick, posterior. Small amount of dark red blood noted on exam glove.   Musculoskeletal: Normal range of motion.  Neurological: She is alert and oriented to person, place, and time.  Skin: Skin is warm. She is not diaphoretic.  Psychiatric: Her behavior is normal.   MAU Course  Procedures  None  MDM  O positive blood type Bedside US done by Np shows fetus with + HR  Pt informed that the ultrasound is considered a limited OB ultrasound and is not intended to be a complete ultrasound exam.  Patient also informed that the ultrasound is not being completed with the intent of assessing for fetal or placental anomalies or any pelvic abnormalities.  Explained that the purpose of today's ultrasound is to assess for  viability.  Patient acknowledges the purpose of the exam and the limitations of the study.     Assessment and Plan   A:  Subchorionic hemorrhage of placenta in first trimester, single or unspecified fetus  Vaginal bleeding in pregnancy, first trimester  Threatened miscarriage in early pregnancy   P:  Discharge home in stable condition Discussed the US in detail from previous visit, poor prognoses given large subchorionic hemorrhage with heavier bleeding now Return to MAU if symptoms worsen Pelvic rest  Nil Bolser, Harolyn RutherfordJennifer I, NP 11/20/2017 3:09 PM

## 2017-11-20 NOTE — Progress Notes (Signed)
Christy Gonzalez tested positive for  Chlamydia. Patient was called by me and allergies and pharmacy confirmed. Rx sent to pharmacy of choice.   Educated on the importance of partner to be treated. [facility] and [facility] need [facility].   Sharyon CableRogers, Caisen Mangas C, CNM 11/20/2017 8:49 PM

## 2018-02-23 ENCOUNTER — Encounter (HOSPITAL_COMMUNITY): Payer: Self-pay

## 2018-02-23 ENCOUNTER — Other Ambulatory Visit (HOSPITAL_COMMUNITY): Payer: Self-pay | Admitting: Obstetrics and Gynecology

## 2018-02-23 DIAGNOSIS — Z3689 Encounter for other specified antenatal screening: Secondary | ICD-10-CM

## 2018-02-23 DIAGNOSIS — Z3A21 21 weeks gestation of pregnancy: Secondary | ICD-10-CM

## 2018-02-24 DIAGNOSIS — Z22330 Carrier of Group B streptococcus: Secondary | ICD-10-CM | POA: Insufficient documentation

## 2018-03-02 ENCOUNTER — Ambulatory Visit (HOSPITAL_COMMUNITY)
Admission: RE | Admit: 2018-03-02 | Discharge: 2018-03-02 | Disposition: A | Discharge status: Home / Self Care | Payer: Medicaid Other | Source: Ambulatory Visit | Attending: Obstetrics and Gynecology | Admitting: Obstetrics and Gynecology

## 2018-03-02 DIAGNOSIS — Z363 Encounter for antenatal screening for malformations: Secondary | ICD-10-CM | POA: Diagnosis not present

## 2018-03-02 DIAGNOSIS — Z3A21 21 weeks gestation of pregnancy: Secondary | ICD-10-CM | POA: Diagnosis present

## 2018-03-02 DIAGNOSIS — Z3689 Encounter for other specified antenatal screening: Secondary | ICD-10-CM | POA: Diagnosis not present

## 2018-03-07 NOTE — L&D Delivery Note (Signed)
Delivery Note At  a viable female was delivered via  (Presentation:ROA ;  ).  APGAR:8 ,9 ; weight pending  .   Placenta status:complete , .3V  Cord:  with the following complications:complete .    Anesthesia:  Epidural Episiotomy:  None Lacerations:  None Suture Repair: NA Est. Blood Loss (mL):  203  Mom to postpartum.  Baby to Couplet care / Skin to Skin.  Henderson Newcomer Prothero 07/10/2018, 4:11 AM

## 2018-06-30 ENCOUNTER — Inpatient Hospital Stay (HOSPITAL_COMMUNITY)
Admission: AD | Admit: 2018-06-30 | Discharge: 2018-06-30 | Disposition: A | Discharge status: Home / Self Care | Payer: Medicaid Other | Attending: Obstetrics & Gynecology | Admitting: Obstetrics & Gynecology

## 2018-06-30 ENCOUNTER — Encounter (HOSPITAL_COMMUNITY): Payer: Self-pay | Admitting: *Deleted

## 2018-06-30 ENCOUNTER — Other Ambulatory Visit: Payer: Self-pay

## 2018-06-30 DIAGNOSIS — O471 False labor at or after 37 completed weeks of gestation: Secondary | ICD-10-CM | POA: Insufficient documentation

## 2018-06-30 DIAGNOSIS — Z3A38 38 weeks gestation of pregnancy: Secondary | ICD-10-CM

## 2018-06-30 DIAGNOSIS — O479 False labor, unspecified: Secondary | ICD-10-CM

## 2018-06-30 DIAGNOSIS — M549 Dorsalgia, unspecified: Secondary | ICD-10-CM | POA: Insufficient documentation

## 2018-06-30 NOTE — MAU Note (Signed)
Christy Gonzalez is a 23 y.o. at [redacted]w[redacted]d here in MAU reporting: back pain and lower abdominal that got more intense today. States she is having BH and the back pain and lower abdominal pressure get worse when those come. No vaginal bleeding, states a couple of days ago she had some clear watery discharge come out one time but none since. + FM  Onset of complaint: today  Pain score: 7/10  Vitals:   06/30/18 1833  BP: 106/77  Pulse: 71  Resp: 18  Temp: 97.6 F (36.4 C)  SpO2: 100%     FHT: +FM  Lab orders placed from triage: none

## 2018-06-30 NOTE — MAU Provider Note (Signed)
I reviewed the NST and agree with the nursing assessment.  EFM: Baseline: 145 bpm, Variability: Good {> 6 bpm), Accelerations: Reactive and Decelerations: Absent Toco: irregular, every 3-7 minutes Discussed watery discharge reported in triage note. Per RN, upon further discussion it was not watery, but C/W mucus.   Katrinka Blazing, [place] 06/30/2018 7:23 PM

## 2018-06-30 NOTE — Discharge Instructions (Signed)
Braxton Hicks Contractions Contractions of the uterus can occur throughout pregnancy, but they are not always a sign that you are in labor. You may have practice contractions called Braxton Hicks contractions. These false labor contractions are sometimes confused with true labor. What are Braxton Hicks contractions? Braxton Hicks contractions are tightening movements that occur in the muscles of the uterus before labor. Unlike true labor contractions, these contractions do not result in opening (dilation) and thinning of the cervix. Toward the end of pregnancy (32-34 weeks), Braxton Hicks contractions can happen more often and may become stronger. These contractions are sometimes difficult to tell apart from true labor because they can be very uncomfortable. You should not feel embarrassed if you go to the hospital with false labor. Sometimes, the only way to tell if you are in true labor is for your health care provider to look for changes in the cervix. The health care provider will do a physical exam and may monitor your contractions. If you are not in true labor, the exam should show that your cervix is not dilating and your water has not broken. If there are no other health problems associated with your pregnancy, it is completely safe for you to be sent home with false labor. You may continue to have Braxton Hicks contractions until you go into true labor. How to tell the difference between true labor and false labor True labor  Contractions last 30-70 seconds.  Contractions become very regular.  Discomfort is usually felt in the top of the uterus, and it spreads to the lower abdomen and low back.  Contractions do not go away with walking.  Contractions usually become more intense and increase in frequency.  The cervix dilates and gets thinner. False labor  Contractions are usually shorter and not as strong as true labor contractions.  Contractions are usually irregular.  Contractions  are often felt in the front of the lower abdomen and in the groin.  Contractions may go away when you walk around or change positions while lying down.  Contractions get weaker and are shorter-lasting as time goes on.  The cervix usually does not dilate or become thin. Follow these instructions at home:   Take over-the-counter and prescription medicines only as told by your health care provider.  Keep up with your usual exercises and follow other instructions from your health care provider.  Eat and drink lightly if you think you are going into labor.  If Braxton Hicks contractions are making you uncomfortable: ? Change your position from lying down or resting to walking, or change from walking to resting. ? Sit and rest in a tub of warm water. ? Drink enough fluid to keep your urine pale yellow. Dehydration may cause these contractions. ? Do slow and deep breathing several times an hour.  Keep all follow-up prenatal visits as told by your health care provider. This is important. Contact a health care provider if:  You have a fever.  You have continuous pain in your abdomen. Get help right away if:  Your contractions become stronger, more regular, and closer together.  You have fluid leaking or gushing from your vagina.  You pass blood-tinged mucus (bloody show).  You have bleeding from your vagina.  You have low back pain that you never had before.  You feel your baby's head pushing down and causing pelvic pressure.  Your baby is not moving inside you as much as it used to. Summary  Contractions that occur before labor are   called Braxton Hicks contractions, false labor, or practice contractions.  Braxton Hicks contractions are usually shorter, weaker, farther apart, and less regular than true labor contractions. True labor contractions usually become progressively stronger and regular, and they become more frequent.  Manage discomfort from Braxton Hicks contractions  by changing position, resting in a warm bath, drinking plenty of water, or practicing deep breathing. This information is not intended to replace advice given to you by your health care provider. Make sure you discuss any questions you have with your health care provider. Document Released: 07/07/2016 Document Revised: 12/06/2016 Document Reviewed: 07/07/2016 Elsevier Interactive Patient Education  2019 Elsevier Inc.  

## 2018-07-09 ENCOUNTER — Inpatient Hospital Stay (HOSPITAL_COMMUNITY)
Admission: AD | Admit: 2018-07-09 | Discharge: 2018-07-09 | Disposition: A | Discharge status: Home / Self Care | Payer: Medicaid Other | Source: Home / Self Care | Attending: Obstetrics and Gynecology | Admitting: Obstetrics and Gynecology

## 2018-07-09 ENCOUNTER — Other Ambulatory Visit: Payer: Self-pay

## 2018-07-09 ENCOUNTER — Encounter (HOSPITAL_COMMUNITY): Payer: Self-pay

## 2018-07-09 DIAGNOSIS — Z3A39 39 weeks gestation of pregnancy: Secondary | ICD-10-CM | POA: Insufficient documentation

## 2018-07-09 DIAGNOSIS — O471 False labor at or after 37 completed weeks of gestation: Principal | ICD-10-CM

## 2018-07-09 DIAGNOSIS — O479 False labor, unspecified: Secondary | ICD-10-CM

## 2018-07-09 NOTE — Discharge Instructions (Signed)

## 2018-07-09 NOTE — MAU Note (Signed)
I have communicated with Donette Larry, CNM and reviewed vital signs:  Vitals:   07/09/18 1016 07/09/18 1025  BP: 121/81 132/86  Pulse: 72 69  Resp: 18   Temp: 97.7 F (36.5 C)   SpO2: 100%     Vaginal exam:  Dilation: 2 Effacement (%): 70 Cervical Position: Posterior Station: -2 Presentation: Vertex Exam by:: B. Husain Costabile, RN ,   Also reviewed contraction pattern and that non-stress test is reactive.  It has been documented that patient is having irregular contractions no cervical change over 1 hour not indicating active labor.  Patient denies any other complaints.  Based on this report provider has given order for discharge.  A discharge order and diagnosis entered by a provider.   Labor discharge instructions reviewed with patient.

## 2018-07-09 NOTE — MAU Note (Signed)
Pt presents to MAU with c/o ctx that started yesterday after loosing mucous plug. She has had light red bloody discharge since then denies LOF. +FM

## 2018-07-10 ENCOUNTER — Inpatient Hospital Stay (HOSPITAL_COMMUNITY)
Admission: AD | Admit: 2018-07-10 | Discharge: 2018-07-12 | DRG: 807 | Disposition: A | Discharge status: Home / Self Care | Payer: Medicaid Other | Attending: Obstetrics & Gynecology | Admitting: Obstetrics & Gynecology

## 2018-07-10 ENCOUNTER — Inpatient Hospital Stay (HOSPITAL_COMMUNITY): Payer: Medicaid Other | Admitting: Anesthesiology

## 2018-07-10 ENCOUNTER — Encounter (HOSPITAL_COMMUNITY): Payer: Self-pay | Admitting: *Deleted

## 2018-07-10 ENCOUNTER — Other Ambulatory Visit: Payer: Self-pay

## 2018-07-10 DIAGNOSIS — O26893 Other specified pregnancy related conditions, third trimester: Secondary | ICD-10-CM | POA: Diagnosis present

## 2018-07-10 DIAGNOSIS — Z3A39 39 weeks gestation of pregnancy: Secondary | ICD-10-CM

## 2018-07-10 DIAGNOSIS — O99324 Drug use complicating childbirth: Secondary | ICD-10-CM | POA: Diagnosis present

## 2018-07-10 DIAGNOSIS — O99334 Smoking (tobacco) complicating childbirth: Secondary | ICD-10-CM | POA: Diagnosis present

## 2018-07-10 DIAGNOSIS — F1290 Cannabis use, unspecified, uncomplicated: Secondary | ICD-10-CM | POA: Diagnosis present

## 2018-07-10 DIAGNOSIS — O99824 Streptococcus B carrier state complicating childbirth: Secondary | ICD-10-CM | POA: Diagnosis present

## 2018-07-10 DIAGNOSIS — F17290 Nicotine dependence, other tobacco product, uncomplicated: Secondary | ICD-10-CM | POA: Diagnosis present

## 2018-07-10 LAB — CBC
HCT: 34.9 % — ABNORMAL LOW (ref 36.0–46.0)
HCT: 32.6 % — ABNORMAL LOW (ref 36.0–46.0)
Hemoglobin: 11.5 g/dL — ABNORMAL LOW (ref 12.0–15.0)
Hemoglobin: 10.7 g/dL — ABNORMAL LOW (ref 12.0–15.0)
MCH: 29.1 pg (ref 26.0–34.0)
MCH: 29.2 pg (ref 26.0–34.0)
MCHC: 33 g/dL (ref 30.0–36.0)
MCHC: 32.8 g/dL (ref 30.0–36.0)
MCV: 88.4 fL (ref 80.0–100.0)
MCV: 89.1 fL (ref 80.0–100.0)
Platelets: 217 10*3/uL (ref 150–400)
Platelets: 171 10*3/uL (ref 150–400)
RBC: 3.95 MIL/uL (ref 3.87–5.11)
RBC: 3.66 MIL/uL — ABNORMAL LOW (ref 3.87–5.11)
RDW: 12.5 % (ref 11.5–15.5)
RDW: 12.5 % (ref 11.5–15.5)
WBC: 5.9 10*3/uL (ref 4.0–10.5)
WBC: 7.7 10*3/uL (ref 4.0–10.5)
nRBC: 0 % (ref 0.0–0.2)
nRBC: 0 % (ref 0.0–0.2)

## 2018-07-10 LAB — TYPE AND SCREEN
ABO/RH(D): O POS
Antibody Screen: NEGATIVE

## 2018-07-10 LAB — ABO/RH: ABO/RH(D): O POS

## 2018-07-10 LAB — RPR: RPR Ser Ql: NONREACTIVE

## 2018-07-10 MED ORDER — ACETAMINOPHEN 325 MG PO TABS: 650.0000 mg | ORAL_TABLET | ORAL | Status: DC | PRN

## 2018-07-10 MED ORDER — OXYTOCIN 40 UNITS IN NORMAL SALINE INFUSION - SIMPLE MED
2.5000 [IU]/h | INTRAVENOUS | Status: DC
  Filled 2018-07-10: qty 1000

## 2018-07-10 MED ORDER — DIPHENHYDRAMINE HCL 50 MG/ML IJ SOLN: 12.5000 mg | INTRAMUSCULAR | Status: DC | PRN

## 2018-07-10 MED ORDER — FENTANYL-BUPIVACAINE-NACL 0.5-0.125-0.9 MG/250ML-% EP SOLN
12.0000 mL/h | EPIDURAL | Status: DC | PRN
  Filled 2018-07-10: qty 250

## 2018-07-10 MED ORDER — SENNOSIDES-DOCUSATE SODIUM 8.6-50 MG PO TABS
2.0000 | ORAL_TABLET | ORAL | Status: DC
  Administered 2018-07-10 – 2018-07-11 (×2): 2 via ORAL
  Filled 2018-07-10 (×2): qty 2

## 2018-07-10 MED ORDER — WITCH HAZEL-GLYCERIN EX PADS: 1.0000 "application " | MEDICATED_PAD | CUTANEOUS | Status: DC | PRN

## 2018-07-10 MED ORDER — IBUPROFEN 600 MG PO TABS
600.0000 mg | ORAL_TABLET | Freq: Four times a day (QID) | ORAL | Status: DC
  Administered 2018-07-10 – 2018-07-12 (×8): 600 mg via ORAL
  Filled 2018-07-10 (×8): qty 1

## 2018-07-10 MED ORDER — LIDOCAINE HCL (PF) 1 % IJ SOLN
INTRAMUSCULAR | Status: DC | PRN
  Administered 2018-07-10 (×2): 5 mL via EPIDURAL

## 2018-07-10 MED ORDER — OXYTOCIN BOLUS FROM INFUSION
500.0000 mL | Freq: Once | INTRAVENOUS | Status: AC
  Administered 2018-07-10: 500 mL via INTRAVENOUS

## 2018-07-10 MED ORDER — ZOLPIDEM TARTRATE 5 MG PO TABS: 5.0000 mg | ORAL_TABLET | Freq: Every evening | ORAL | Status: DC | PRN

## 2018-07-10 MED ORDER — COCONUT OIL OIL: 1.0000 "application " | TOPICAL_OIL | Status: DC | PRN

## 2018-07-10 MED ORDER — PRENATAL MULTIVITAMIN CH
1.0000 | ORAL_TABLET | Freq: Every day | ORAL | Status: DC
  Administered 2018-07-10 – 2018-07-11 (×2): 1 via ORAL
  Filled 2018-07-10 (×2): qty 1

## 2018-07-10 MED ORDER — EPHEDRINE 5 MG/ML INJ: 10.0000 mg | INTRAVENOUS | Status: DC | PRN

## 2018-07-10 MED ORDER — DIPHENHYDRAMINE HCL 25 MG PO CAPS: 25.0000 mg | ORAL_CAPSULE | Freq: Four times a day (QID) | ORAL | Status: DC | PRN

## 2018-07-10 MED ORDER — LACTATED RINGERS IV SOLN
INTRAVENOUS | Status: DC
  Administered 2018-07-10 (×2): via INTRAVENOUS

## 2018-07-10 MED ORDER — ONDANSETRON HCL 4 MG/2ML IJ SOLN: 4.0000 mg | Freq: Four times a day (QID) | INTRAMUSCULAR | Status: DC | PRN

## 2018-07-10 MED ORDER — LACTATED RINGERS IV SOLN
500.0000 mL | Freq: Once | INTRAVENOUS | Status: AC
  Administered 2018-07-10: 500 mL via INTRAVENOUS

## 2018-07-10 MED ORDER — LACTATED RINGERS IV SOLN: 500.0000 mL | INTRAVENOUS | Status: DC | PRN

## 2018-07-10 MED ORDER — ONDANSETRON HCL 4 MG PO TABS: 4.0000 mg | ORAL_TABLET | ORAL | Status: DC | PRN

## 2018-07-10 MED ORDER — SOD CITRATE-CITRIC ACID 500-334 MG/5ML PO SOLN: 30.0000 mL | ORAL | Status: DC | PRN

## 2018-07-10 MED ORDER — SIMETHICONE 80 MG PO CHEW: 80.0000 mg | CHEWABLE_TABLET | ORAL | Status: DC | PRN

## 2018-07-10 MED ORDER — LACTATED RINGERS IV SOLN: 500.0000 mL | Freq: Once | INTRAVENOUS | Status: DC

## 2018-07-10 MED ORDER — TETANUS-DIPHTH-ACELL PERTUSSIS 5-2.5-18.5 LF-MCG/0.5 IM SUSP: 0.5000 mL | Freq: Once | INTRAMUSCULAR | Status: DC

## 2018-07-10 MED ORDER — OXYCODONE-ACETAMINOPHEN 5-325 MG PO TABS: 2.0000 | ORAL_TABLET | ORAL | Status: DC | PRN

## 2018-07-10 MED ORDER — SODIUM CHLORIDE 0.9 % IV SOLN
5.0000 10*6.[IU] | Freq: Once | INTRAVENOUS | Status: AC
  Administered 2018-07-10: 5 10*6.[IU] via INTRAVENOUS
  Filled 2018-07-10: qty 5

## 2018-07-10 MED ORDER — PHENYLEPHRINE 40 MCG/ML (10ML) SYRINGE FOR IV PUSH (FOR BLOOD PRESSURE SUPPORT): 80.0000 ug | PREFILLED_SYRINGE | INTRAVENOUS | Status: DC | PRN

## 2018-07-10 MED ORDER — SODIUM CHLORIDE (PF) 0.9 % IJ SOLN
INTRAMUSCULAR | Status: DC | PRN
  Administered 2018-07-10: 12 mL/h via EPIDURAL

## 2018-07-10 MED ORDER — OXYCODONE-ACETAMINOPHEN 5-325 MG PO TABS: 1.0000 | ORAL_TABLET | ORAL | Status: DC | PRN

## 2018-07-10 MED ORDER — LIDOCAINE HCL (PF) 1 % IJ SOLN: 30.0000 mL | INTRAMUSCULAR | Status: DC | PRN

## 2018-07-10 MED ORDER — DIBUCAINE (PERIANAL) 1 % EX OINT: 1.0000 "application " | TOPICAL_OINTMENT | CUTANEOUS | Status: DC | PRN

## 2018-07-10 MED ORDER — FLEET ENEMA 7-19 GM/118ML RE ENEM: 1.0000 | ENEMA | RECTAL | Status: DC | PRN

## 2018-07-10 MED ORDER — PENICILLIN G 3 MILLION UNITS IVPB - SIMPLE MED: 3.0000 10*6.[IU] | INTRAVENOUS | Status: DC

## 2018-07-10 MED ORDER — PHENYLEPHRINE 40 MCG/ML (10ML) SYRINGE FOR IV PUSH (FOR BLOOD PRESSURE SUPPORT)
80.0000 ug | PREFILLED_SYRINGE | INTRAVENOUS | Status: DC | PRN
  Filled 2018-07-10: qty 10

## 2018-07-10 MED ORDER — ONDANSETRON HCL 4 MG/2ML IJ SOLN: 4.0000 mg | INTRAMUSCULAR | Status: DC | PRN

## 2018-07-10 MED ORDER — BENZOCAINE-MENTHOL 20-0.5 % EX AERO: 1.0000 "application " | INHALATION_SPRAY | CUTANEOUS | Status: DC | PRN

## 2018-07-10 NOTE — Lactation Note (Signed)
This note was copied from a baby's chart. Lactation Consultation Note  Patient Name: Christy Gonzalez HALPF'X Date: 07/10/2018 Reason for consult: Initial assessment;Term  16 hours old FT female who is being now partially BF and formula fed by his mother, she's a P3 and somehow experienced BF. She didn't BF her first child but BF her second one for 1 1/2 months. Mom had cigar and THC use during the pregnancy per MD note.Mom came as formula but she told her RN Christy Gonzalez that baby is not taking well the bottles and started putting baby to the breast and he's latching on pretty well. RN Christy Gonzalez notified LC that mom wants to try BF. Mom participated in the [facility] program and she's already familiar with hand expression, and able to obtain some drops with the help of her RN.   Baby already nursing when entering the room, noticed that mom had baby swaddled and latch wasn't very deep. Advised mom to try STS on posterior feedings and LC repositioned baby to make latch a little deeper, showed mom cross cradle hold instead of the typical cradle she was doing. Mom has piercings on both nipples but she told LC she's removing them prior putting baby to the breast. A few audible swallows noted upon breast compressions, mom very pleased and GOB proud that baby likes "the boob" better than the formula, baby is also on Gerber Gentle. Reviewed normal newborn behavior and feeding cues.  Feeding plan:  1. Encouraged mom to feed baby STS 8-12 times/24 hours or sooner if feeding cues are present 2. Hand expression and spoon feeding were also encouraged 3. Mom still has plenty of Gerber formula and slow flow nipples in her room and will probably use them, but encouraged her to try putting baby to the breast first.  BF brochure, BF resources and feeding diary were reviewed. Mom reported all questions and concerns were answered, she's aware of LC OP services and will call PRN.  Maternal Data Formula Feeding for Exclusion: Yes Reason  for exclusion: Mother's choice to formula feed on admision Has patient been taught Hand Expression?: Yes Does the patient have breastfeeding experience prior to this delivery?: Yes  Feeding Feeding Type: Breast Fed  LATCH Score Latch: Grasps breast easily, tongue down, lips flanged, rhythmical sucking.  Audible Swallowing: A few with stimulation  Type of Nipple: Everted at rest and after stimulation(mom has piercings on both nipples)  Comfort (Breast/Nipple): Soft / non-tender  Hold (Positioning): Assistance needed to correctly position infant at breast and maintain latch.  LATCH Score: 8  Interventions Interventions: Breast feeding basics reviewed;Adjust position;Breast compression;Assisted with latch  Lactation Tools Discussed/Used WIC Program: Yes   Consult Status Consult Status: Follow-up Date: 07/11/18 Follow-up type: In-patient    Christy Gonzalez 07/10/2018, 11:26 PM

## 2018-07-10 NOTE — H&P (Signed)
Christy Gonzalez is a 23 y.o. female, G3P2002 at 39.6 weeks, presenting for labor.  Pt states having contractions all day.  Bloody discharge, denies leakage of fluid.  FM+.  Prenatal hx of UTI with GBS.   Patient Active Problem List   Diagnosis Date Noted  . Normal labor 07/10/2018  . Chlamydia infection affecting pregnancy in first trimester 11/20/2017  . Spontaneous vaginal delivery 05/21/2015  . Rubella non-immune status, antepartum 05/21/2015  . Anemia of pregnancy 05/21/2015    History of present pregnancy: Patient entered care at 13.5 weeks.   EDC of 5/6/2020was established by LMP.   Anatomy scan:21.4  weeks, with normal findings and an posterior placenta.   Additional [place] evaluations:   Significant prenatal events:   Last evaluation: Earlier today  OB History    Gravida  3   Para  2   Term  2   Preterm      AB      Living  2     SAB      TAB      Ectopic      Multiple  0   Live Births  2          Past Medical History:  Diagnosis Date  . Anemia   . Iron deficiency    Past Surgical History:  Procedure Laterality Date  . NO PAST SURGERIES     Family History: family history includes Hypertension in her mother. Social History:  reports that she has been smoking cigars. She has never used smokeless tobacco. She reports current drug use. Drug: Marijuana. She reports that she does not drink alcohol.   Prenatal Transfer Tool  Maternal Diabetes: No Genetic Screening: Normal Maternal Ultrasounds/Referrals: Normal Fetal Ultrasounds or other Referrals:  None Maternal Substance Abuse:  No Significant Maternal Medications:  None Significant Maternal Lab Results: None  TDAP Declined Flu UTD  ROS: All ten systems reviewed and negative except as stated above  No Known Allergies   Dilation: 4.5 Effacement (%): 80 Station: -2 Exam by:: KRISTIN, RN Blood pressure 126/81, pulse 72, temperature 98.6 F (37 C), resp. rate 20, height 5\' 3"  (1.6 m),  weight 73 kg, last menstrual period 10/04/2017, unknown if currently breastfeeding.  Chest clear Heart RRR without murmur Abd gravid, NT, FH appropriate Pelvic: per RN Ext:Neg  FHR: Category 1 FHT 145 accels, no decels, variability present UCs:  2 in 10 minutes  Prenatal labs: ABO, Rh: --/--/PENDING (05/05 0136) O pos Antibody: PENDING (05/05 0136) Neg Rubella:   Imm RPR:   NR HBsAg:   NEg HIV: Non Reactive (09/12 2059)  GBS:  Positive Sickle cell/Hgb electrophoresis: AA  GC: Neg Chlamydia: Neg Genetic screenings: Neg Glucola: 77 Other:   Hgb 11.1 at NOB, 10.2 at 28 weeks       Assessment/Plan: IUP at 39.6 IUP in active labor Cat 1 strip GBS positive Plan: Admit to Birthing Suite  Routine [facility] orders Pain med/epidural prn PCN G for GBS prophylaxis  Anticipate SVD  Henderson Newcomer ProtheroCNM, MSN 07/10/2018, 1:59 AM

## 2018-07-10 NOTE — Anesthesia Preprocedure Evaluation (Signed)
Anesthesia Evaluation  Patient identified by MRN, date of birth, ID band Patient awake    Reviewed: Allergy & Precautions, H&P , NPO status , Patient's Chart, lab work & pertinent test results  Airway Mallampati: II   Neck ROM: full    Dental   Pulmonary Current Smoker,    breath sounds clear to auscultation       Cardiovascular negative cardio ROS   Rhythm:regular Rate:Normal     Neuro/Psych    GI/Hepatic   Endo/Other    Renal/GU      Musculoskeletal   Abdominal   Peds  Hematology   Anesthesia Other Findings   Reproductive/Obstetrics (+) Pregnancy                             Anesthesia Physical Anesthesia Plan  ASA: II  Anesthesia Plan: Epidural   Post-op Pain Management:    Induction: Intravenous  PONV Risk Score and Plan: 1 and Treatment may vary due to age or medical condition  Airway Management Planned: Natural Airway  Additional Equipment:   Intra-op Plan:   Post-operative Plan:   Informed Consent: I have reviewed the patients History and Physical, chart, labs and discussed the procedure including the risks, benefits and alternatives for the proposed anesthesia with the patient or authorized representative who has indicated his/her understanding and acceptance.       Plan Discussed with: Anesthesiologist  Anesthesia Plan Comments:         Anesthesia Quick Evaluation

## 2018-07-10 NOTE — Anesthesia Postprocedure Evaluation (Signed)
Anesthesia Post Note  Patient: Christy Gonzalez  Procedure(s) Performed: AN AD HOC LABOR EPIDURAL     Patient location during evaluation: Mother Baby Anesthesia Type: Epidural Level of consciousness: awake Pain management: satisfactory to patient Vital Signs Assessment: post-procedure vital signs reviewed and stable Respiratory status: spontaneous breathing Cardiovascular status: stable Anesthetic complications: no    Last Vitals:  Vitals:   07/10/18 0725 07/10/18 1219  BP: 128/85 120/75  Pulse: 67 65  Resp: 18   Temp: 36.7 C 36.6 C  SpO2:      Last Pain:  Vitals:   07/10/18 0725  TempSrc: Oral  PainSc: 0-No pain   Pain Goal:                   [employer]

## 2018-07-10 NOTE — MAU Note (Signed)
PT SAYS SHE WENT HOME FROM MAU- TODAY -   AT HOME  UC STRONGER.   GBS- POSITIVE.  DENIES HSV AND MRSA

## 2018-07-10 NOTE — Anesthesia Procedure Notes (Signed)
Epidural Patient location during procedure: OB Start time: 07/10/2018 2:16 AM End time: 07/10/2018 2:26 AM  Staffing Anesthesiologist: Achille Rich, MD Performed: anesthesiologist   Preanesthetic Checklist Completed: patient identified, site marked, pre-op evaluation, timeout performed, IV checked, risks and benefits discussed and monitors and equipment checked  Epidural Patient position: sitting Prep: DuraPrep Patient monitoring: heart rate, cardiac monitor, continuous pulse ox and blood pressure Approach: midline Location: L2-L3 Injection technique: LOR saline  Needle:  Needle type: Tuohy  Needle gauge: 17 G Needle length: 9 cm Needle insertion depth: 6 cm Catheter type: closed end flexible Catheter size: 19 Gauge Catheter at skin depth: 12 cm Test dose: negative and Other  Assessment Events: blood not aspirated, injection not painful, no injection resistance and negative IV test  Additional Notes Informed consent obtained prior to proceeding including risk of failure, 1% risk of PDPH, risk of minor discomfort and bruising.  Discussed rare but serious complications including epidural abscess, permanent nerve injury, epidural hematoma.  Discussed alternatives to epidural analgesia and patient desires to proceed.  Timeout performed pre-procedure verifying patient name, procedure, and platelet count.  Patient tolerated procedure well. Reason for block:procedure for pain

## 2018-07-11 MED ORDER — MEASLES, MUMPS & RUBELLA VAC IJ SOLR
0.5000 mL | Freq: Once | INTRAMUSCULAR | Status: AC
  Administered 2018-07-12: 0.5 mL via SUBCUTANEOUS
  Filled 2018-07-11: qty 0.5

## 2018-07-11 MED ORDER — MEDROXYPROGESTERONE ACETATE 150 MG/ML IM SUSP
150.0000 mg | Freq: Once | INTRAMUSCULAR | Status: AC
  Administered 2018-07-12: 150 mg via INTRAMUSCULAR
  Filled 2018-07-11: qty 1

## 2018-07-11 NOTE — Progress Notes (Signed)
Pt's OB Prenatal record shows that  her Rubella is Immune. The test was was taken on 12/27/2017 and reviewed by Imanns.

## 2018-07-11 NOTE — Clinical Social Work Maternal (Signed)
CLINICAL SOCIAL WORK MATERNAL/CHILD NOTE  Patient Details  Name: Christy Gonzalez MRN: [id number] Date of Birth: 07/10/2019  Date:  07/11/2018  Clinical Social Worker Initiating Note:  Hortencia Pilar, LCSWA  Date/Time: Initiated:  07/11/18/0915     Child's Name:  Christy Gonzalez    Biological Parents:  Mother(Christy Gonzalez )   Need for Interpreter:  None   Reason for Referral:  Current Substance Use/Substance Use During Pregnancy    Address:  [address] [place] [address]    Phone number:  [phone] (home)     Additional phone number: none   Household Members/Support Persons (HM/SP):   Household Member/Support Person 5, Household Member/Support Person 2   HM/SP Name Relationship DOB or Age  HM/SP -1        HM/SP -2 Christy Gonzalez (MOB)  MOB  23  HM/SP -3   Christy Gonzalez (MOB's mom)   MOB's Mom     HM/SP -4   Christy Gonzalez (Sibling)   Sibling   8  HM/SP -5 Christy Gonzalez (Sibling)   sibling   3  HM/SP -6        HM/SP -7        HM/SP -8          Natural Supports (not living in the home):  Friends   Professional Supports: None   Employment: Unemployed   Type of Work:   unemployed   Education:  Engineer, agricultural   Homebound arranged:  n/a  Surveyor, quantity Resources:  Medicaid   Other Resources:  [employer], Sales executive    Cultural/Religious Considerations Which May Impact Care:  none presented   Strengths:  Compliance with medical plan , Ability to meet basic needs    Psychotropic Medications:         Pediatrician:       Pediatrician List:   Radiographer, therapeutic    [place]    [facility]      Pediatrician Fax Number:    Risk Factors/Current Problems:  Substance Use    Cognitive State:  Alert , Able to Concentrate , Insightful    Mood/Affect:  Comfortable , Calm    CSW Assessment: CSW consulted as MOB reported THC use during pregnancy. CSW went to speak with MOB at bedside.    Upon entering the room CSW observed that MOB was lying in bed on her left side. CSW noticed that MOB had guest in the room. CSW introduced role and congratulated MOB on the birth of infant Copywriter, advertising). CSW asked MOB who her guest was and MOB responded "my mom". CSW advised MOB and MOB's mom of HIPPA policy. MOB asked CSW what it was about? CSW expressed to MOB that it was pertaining to her and not the infant. CSW was then asked to proceed with asking MOB's mom to step out of the room. MOB's mom was very understanding and agreeable to leave room.   CSW explained to MOB the reason for visit. CSW was advised that MOB did use THC during her pregnancy with last use being over a month ago. After MOB told CSW this MOB picked up her phone and called her mom (waiting in lobby) and expressed to her why CSW was in the room. MOB advised mom that she could come back to the room as CSW only wanted to talk about THC use-while MOB reports that mom already knew about. CSW very  understanding of this and informed MOB that if she wanted mom back in the room then that was her right. CSW opened door for MOB's mom and proceeded with assessment. CSW was advised that MOB used THC in order to eat and to aide with her acid reflux. MOB reported that she was given medication for this however she did not take them and used THC as a method to treat her symptoms. CSW understanding and advised MOB of hospital drug screen policy. CSW informed MOB that infants UDS came back negative however infants cord would still be monitored. CSW advised MOB that if infants CDS comes back positive for any substances that were not given to her while in the hospital then a CPS report would need to be made. MOB paused at this time and then expressed "yes I understand". MOB reports no prior history with CPS at this time.   CSW was advised that MOB is from home with her mom Christy Gonzalez. MOB"s mom expressed that there are "mayne 10-11 people living in the home".  CSW was advised by MOB's mom that MOB does live in the home with her older two daughters. MOB expressed that [facility] well as Sales executiveood Stamps. CSW asked for details on FOB and MOB expressed that she didn't want to give his name as he is in jail. MOB asked CSW "what do ya'll need that for?'. CSW informed MOB that with all assessments CSW ask about FOB in the event that his information is needed-MOB still declined to give name of FOB.   CSW was advised that MOB complete  highschool but currently doesn't work. MOB reports that transportation issues is not barrier at this time. CSW reviewed with MOB SIDS as well as PPD education. CSW provided MOB with Postpartum Checklist to monitor feelings at this time.   CSW Plan/Description:  No Further Intervention Required/No Barriers to Discharge, CSW Will Continue to Monitor Umbilical Cord Tissue Drug Screen Results and Make Report if [facility] Policy Information, Sudden Infant Death Syndrome (SIDS) Education    Robb MatarKierra S Charle Clear, LCSWA 07/11/2018, 9:40 AM

## 2018-07-11 NOTE — Progress Notes (Signed)
Post Partum Day 1  Subjective: no complaints, up ad lib, voiding and tolerating PO. Baby is breastfeeding well.   Objective: Vitals:   07/10/18 1219 07/10/18 1515 07/10/18 2135 07/10/18 2235  BP: 120/75 105/70 111/82 109/72  Pulse: 65 66 62 65  Resp: 18 18 18 18   Temp: 97.8 F (36.6 C) 97.8 F (36.6 C) 98.1 F (36.7 C) 98.2 F (36.8 C)  TempSrc: Oral Oral  Oral  SpO2:  98% 98%   Weight:      Height:        Physical Exam:  General: alert and cooperative Lochia: appropriate Uterine Fundus: firm Incision: n/a DVT Evaluation: No evidence of DVT seen on physical exam. Negative Homan's sign. No cords or calf tenderness. No significant calf/ankle edema.  Recent Labs    07/10/18 0136 07/10/18 0650  HGB 11.5* 10.7*  HCT 34.9* 32.6*    Assessment/Plan: Plan for discharge tomorrow and Breastfeeding   LOS: 1 day   Janeece Riggers 07/11/2018, 1:42 AM

## 2018-07-12 MED ORDER — IBUPROFEN 600 MG PO TABS: 600.0000 mg | ORAL_TABLET | Freq: Four times a day (QID) | ORAL | 0 refills | Status: DC

## 2018-07-12 NOTE — Discharge Summary (Signed)
SVD OB Discharge Summary     Patient Name: Christy Gonzalez DOB: 1995-05-18 MRN: [id number]  Date of admission: 07/10/2018 Delivering MD: Starla Link  Date of delivery: 07/10/2018 Type of delivery: SVD  Newborn Data: Sex: Baby Female  Circumcision: out pt circ desired Live born female  Birth Weight: 6 lb 4.9 oz (2860 g) APGAR: 8, 9  Newborn Delivery   Birth date/time:  07/10/2018 03:51:00 Delivery type:  Vaginal, Spontaneous     Feeding: breast and bottle Infant being discharge to home with mother in stable condition.   Admitting diagnosis: 41.6WKS CTX Intrauterine pregnancy: [redacted]w[redacted]d    Secondary diagnosis:  Active Problems:   Normal labor   SVD (spontaneous vaginal delivery)   Normal postpartum course                                Complications: None                                                              Intrapartum Procedures: spontaneous vaginal delivery and GBS prophylaxis Postpartum Procedures: none Complications-Operative and Postpartum: none Augmentation: None   History of Present Illness: Ms. Christy BUDZIKis a 23y.o. female, [id number] who presents at 355w6deeks gestation. [facility]  Her pregnancy has been complicated by:  Patient Active Problem List   Diagnosis Date Noted  . Normal postpartum course 07/12/2018  . Normal labor 07/10/2018  . SVD (spontaneous vaginal delivery) 07/10/2018  . Chlamydia infection affecting pregnancy in first trimester 11/20/2017  . Spontaneous vaginal delivery 05/21/2015  . Rubella non-immune status, antepartum 05/21/2015  . Anemia of pregnancy 05/21/2015    Hospital course:  Onset of Labor With Vaginal Delivery     [age].o. yo [id number] 3924w6ds admitted in Latent Labor on 07/10/2018. Patient had an uncomplicated labor course as follows:  Membrane Rupture Time/Date: 3:48 AM ,07/10/2018   Intrapartum Procedures: Episiotomy: None [1]                               Lacerations:  None [1]  Patient had a delivery of a Viable infant. 07/10/2018  Information for the patient's newborn:  HooRaiven, Belizaire3[[id number]]elivery Method: Vag-Spont    [name] had an uncomplicated postpartum course.  She is ambulating, tolerating a regular diet, passing flatus, and urinating well. Patient is discharged home in stable condition on 07/12/18.  Postpartum Day # 2 : S/P NSVD due to AROM with early labor. Patient up ad lib, denies syncope or dizziness. Reports consuming regular diet without issues and denies N/V. Patient reports 0 bowel movement + passing flatus.  Denies issues with urination and reports bleeding is "lighter."  Patient is breast and bottle feeding and reports going well.  Desires depo for postpartum contraception.  Pain is being appropriately managed with use of po meds. Pt is to receive depo and MMR before leaving the hospital. Pt rubella non-immune.   Physical exam  Vitals:   07/10/18 2235 07/11/18 0515 07/11/18 1307 07/11/18 2335  BP: 109/72 111/68 112/77 105/75  Pulse: 65 67 75 75  Resp: '18 18 18 18  '$ Temp: 98.2 F (36.8 C) 98.1 F (36.7 C) 98.5 F (36.9 C) 98.9 F (37.2 C)  TempSrc: Oral Oral Oral Oral  SpO2:  98%    Weight:      Height:       General: alert, cooperative and no distress Lochia: appropriate Uterine Fundus: firm Perineum: Intact DVT Evaluation: No evidence of DVT seen on physical exam. Negative Homan's sign. No cords or calf tenderness. No significant calf/ankle edema.  Labs: Lab Results  Component Value Date   WBC 7.7 07/10/2018   HGB 10.7 (L) 07/10/2018   HCT 32.6 (L) 07/10/2018   MCV 89.1 07/10/2018   PLT 171 07/10/2018   CMP Latest Ref Rng & Units 02/11/2014  Glucose 70 - 99 mg/dL 96  BUN 6 - 23 mg/dL 15  Creatinine 0.50 - 1.10 mg/dL 1.13(H)  Sodium 137 - 147 mEq/L 137  Potassium 3.7 - 5.3 mEq/L 3.9  Chloride 96 - 112 mEq/L 99  CO2 19 - 32 mEq/L 25  Calcium 8.4 - 10.5 mg/dL  10.0  Total Protein 6.0 - 8.3 g/dL 7.7  Total Bilirubin 0.3 - 1.2 mg/dL 0.3  Alkaline Phos 39 - 117 U/L 61  AST 0 - 37 U/L 12  ALT 0 - 35 U/L 7    Date of discharge: 07/12/2018 Discharge Diagnoses: Term Pregnancy-delivered Discharge instruction: per After Visit Summary and "Baby and Me Booklet".  After visit meds:  Allergies as of 07/12/2018   No Known Allergies     Medication List    TAKE these medications   acetaminophen 325 MG tablet Commonly known as:  TYLENOL Take 650 mg by mouth every 6 (six) hours as needed for mild pain or headache.   ibuprofen 600 MG tablet Commonly known as:  ADVIL Take 1 tablet (600 mg total) by mouth every 6 (six) hours.   IRON PO Take 1 tablet by mouth daily.   prenatal multivitamin Tabs tablet Take 1 tablet by mouth daily at 12 noon.       Activity:           unrestricted and pelvic rest Advance as tolerated. Pelvic rest for 6 weeks.  Diet:                routine Medications: PNV and Ibuprofen Postpartum contraception: Depo Provera Condition:  Pt discharge to home with baby in stable condition Rubella Non-Immune: To recived MMR before leaving the hospital   Meds: Allergies as of 07/12/2018   No Known Allergies     Medication List    TAKE these medications   acetaminophen 325 MG tablet Commonly known as:  TYLENOL Take 650 mg by mouth every 6 (six) hours as needed for mild pain or headache.   ibuprofen 600 MG tablet Commonly known as:  ADVIL Take 1 tablet (600 mg total) by mouth every 6 (six) hours.   IRON PO Take 1 tablet by mouth daily.   prenatal multivitamin Tabs tablet Take 1 tablet by mouth daily at 12 noon.       Discharge Follow Up:  [facility] Follow up in 6 week(s).   Specialty:  Obstetrics and Gynecology Why:  Please also call and make an appointment for 1-2 weeks with [facility] for Female circ.  Contact information: [address] [place]  [place] [address] [place], NP-C, [place] 07/12/2018, 12:01 AM  Christy Gonzalez,

## 2020-01-12 ENCOUNTER — Ambulatory Visit (HOSPITAL_COMMUNITY)
Admission: EM | Admit: 2020-01-12 | Discharge: 2020-01-12 | Disposition: A | Discharge status: Home / Self Care | Payer: Medicaid Other

## 2020-01-12 ENCOUNTER — Other Ambulatory Visit: Payer: Self-pay

## 2020-01-12 ENCOUNTER — Encounter (HOSPITAL_COMMUNITY): Payer: Self-pay

## 2020-01-12 DIAGNOSIS — H01001 Unspecified blepharitis right upper eyelid: Secondary | ICD-10-CM

## 2020-01-12 MED ORDER — ERYTHROMYCIN 5 MG/GM OP OINT: TOPICAL_OINTMENT | OPHTHALMIC | 0 refills | Status: DC

## 2020-01-12 NOTE — ED Provider Notes (Signed)
[facility]    CSN: [id number] Arrival date & time: 01/12/20  1341      History   Chief Complaint Chief Complaint  Patient presents with  . Eye Problem    HPI Christy Gonzalez is a 24 y.o. female.   Patient presenting today with 1 day hx of right upper eyelid redness, pain, and swelling. Some eye drainage but otherwise no actual eye redness, itching, blurred vision, headache, N/V, fever. No known trauma to eye, no new makeup or lash products. Not trying anything for sxs so far.      Past Medical History:  Diagnosis Date  . Anemia   . Iron deficiency     Patient Active Problem List   Diagnosis Date Noted  . Normal postpartum course 07/12/2018  . Normal labor 07/10/2018  . SVD (spontaneous vaginal delivery) 07/10/2018  . Chlamydia infection affecting pregnancy in first trimester 11/20/2017  . Spontaneous vaginal delivery 05/21/2015  . Rubella non-immune status, antepartum 05/21/2015  . Anemia of pregnancy 05/21/2015    Past Surgical History:  Procedure Laterality Date  . NO PAST SURGERIES      OB History    Gravida  3   Para  3   Term  3   Preterm      AB      Living  3     SAB      TAB      Ectopic      Multiple  0   Live Births  3            Home Medications    Prior to Admission medications   Medication Sig Start Date End Date Taking? Authorizing Provider  medroxyPROGESTERone (DEPO-PROVERA) 150 MG/ML injection Inject 150 mg into the muscle every 3 (three) months.   Yes [provider]  acetaminophen (TYLENOL) 325 MG tablet Take 650 mg by mouth every 6 (six) hours as needed for mild pain or headache.     [provider]  erythromycin ophthalmic ointment Place a 1/2 inch ribbon of ointment into the lower eyelid BID prn. 01/12/20   Particia Nearing, PA-C  ibuprofen (ADVIL) 600 MG tablet Take 1 tablet (600 mg total) by mouth every 6 (six) hours. 07/12/18   [place], Lesly Rubenstein, FNP  IRON PO Take 1 tablet by  mouth daily.    [provider]  Prenatal Vit-Fe Fumarate-FA (PRENATAL MULTIVITAMIN) TABS tablet Take 1 tablet by mouth daily at 12 noon.    [provider]    Family History Family History  Problem Relation Age of Onset  . Hypertension Mother     Social History Social History   Tobacco Use  . Smoking status: Current Some Day Smoker    Types: Cigars  . Smokeless tobacco: Never Used  . Tobacco comment: <5 cigars a day  Substance Use Topics  . Alcohol use: No  . Drug use: Yes    Types: Marijuana    Comment: 1 month ago     Allergies   Patient has no known allergies.   Review of Systems Review of Systems PER HPI    Physical Exam Triage Vital Signs ED Triage Vitals  Enc Vitals Group     BP 01/12/20 1412 101/71     Pulse Rate 01/12/20 1412 71     Resp 01/12/20 1412 16     Temp 01/12/20 1412 98.7 F (37.1 C)     Temp Source 01/12/20 1412 Oral     SpO2  01/12/20 1412 100 %     Weight --      Height --      Head Circumference --      Peak Flow --      Pain Score 01/12/20 1410 6     Pain Loc --      Pain Edu? --      Excl. in GC? --    No data found.  Updated Vital Signs BP 101/71 (BP Location: Right Arm)   Pulse 71   Temp 98.7 F (37.1 C) (Oral)   Resp 16   SpO2 100%   Visual Acuity Right Eye Distance: 20/200 (without correction) Left Eye Distance: 20/200 (without correction) Bilateral Distance: 20/100 (without correction)  Right Eye Near:   Left Eye Near:    Bilateral Near:     Physical Exam Vitals and nursing note reviewed.  Constitutional:      Appearance: Normal appearance. She is not ill-appearing.  HENT:     Head: Atraumatic.     Nose: Nose normal.     Mouth/Throat:     Mouth: Mucous membranes are moist.     Pharynx: Oropharynx is clear.  Eyes:     Extraocular Movements: Extraocular movements intact.     Conjunctiva/sclera: Conjunctivae normal.     Comments: Right upper eyelid erythematous, edematous, ttp diffusely  and worst at lash line   Cardiovascular:     Rate and Rhythm: Normal rate and regular rhythm.     Heart sounds: Normal heart sounds.  Pulmonary:     Effort: Pulmonary effort is normal.     Breath sounds: Normal breath sounds.  Musculoskeletal:        General: Normal range of motion.     Cervical back: Normal range of motion and neck supple.  Skin:    General: Skin is warm and dry.  Neurological:     Mental Status: She is alert and oriented to person, place, and time.  Psychiatric:        Mood and Affect: Mood normal.        Thought Content: Thought content normal.        Judgment: Judgment normal.      UC Treatments / Results  Labs (all labs ordered are listed, but only abnormal results are displayed) Labs Reviewed - No data to display  EKG   Radiology No results found.  Procedures Procedures (including critical care time)  Medications Ordered in UC Medications - No data to display  Initial Impression / Assessment and Plan / UC Course  I have reviewed the triage vital signs and the nursing notes.  Pertinent labs & imaging results that were available during my care of the patient were reviewed by me and considered in my medical decision making (see chart for details).     Poor visual acuity testing today though she states this is her baseline and she is overdue to get glasses. Denies any change to acuity since onset of sxs. Discussed erythromycin ointment, warm compresses, anti inflammatory medications. F/u if sxs worsen or fail to improve.   Final Clinical Impressions(s) / UC Diagnoses   Final diagnoses:  Blepharitis of right upper eyelid, unspecified type   Discharge Instructions   None    ED Prescriptions    Medication Sig Dispense Auth. Provider   erythromycin ophthalmic ointment Place a 1/2 inch ribbon of ointment into the lower eyelid BID prn. 3.5 g Particia Nearing, PA-C     PDMP not reviewed this encounter.  Particia Nearing,  [place] 01/12/20 1451

## 2020-01-12 NOTE — ED Triage Notes (Signed)
Pt reports having swelling in the right upper eyelid since this morning.  States is sore to touch. Denies any crusted, drainage.

## 2020-01-12 NOTE — ED Notes (Signed)
Pt states she needs to wear glasses, but she have not visited any Ophthalmologist recently.

## 2020-06-11 ENCOUNTER — Other Ambulatory Visit: Payer: Self-pay

## 2020-06-11 ENCOUNTER — Encounter (HOSPITAL_COMMUNITY): Payer: Self-pay | Admitting: *Deleted

## 2020-06-11 ENCOUNTER — Ambulatory Visit (HOSPITAL_COMMUNITY)
Admission: EM | Admit: 2020-06-11 | Discharge: 2020-06-11 | Disposition: A | Discharge status: Home / Self Care | Payer: Medicaid Other | Attending: Emergency Medicine | Admitting: Emergency Medicine

## 2020-06-11 DIAGNOSIS — Z3202 Encounter for pregnancy test, result negative: Secondary | ICD-10-CM

## 2020-06-11 DIAGNOSIS — R350 Frequency of micturition: Secondary | ICD-10-CM | POA: Diagnosis present

## 2020-06-11 DIAGNOSIS — R109 Unspecified abdominal pain: Secondary | ICD-10-CM | POA: Diagnosis not present

## 2020-06-11 LAB — POCT URINALYSIS DIPSTICK, ED / UC
Bilirubin Urine: NEGATIVE
Glucose, UA: NEGATIVE mg/dL
Hgb urine dipstick: NEGATIVE
Ketones, ur: NEGATIVE mg/dL
Leukocytes,Ua: NEGATIVE
Nitrite: NEGATIVE
Protein, ur: NEGATIVE mg/dL
Specific Gravity, Urine: 1.01 (ref 1.005–1.030)
Urobilinogen, UA: 0.2 mg/dL (ref 0.0–1.0)
pH: 5.5 (ref 5.0–8.0)

## 2020-06-11 LAB — HCG, QUANTITATIVE, PREGNANCY: hCG, Beta Chain, Quant, S: <1 m[IU]/mL (ref <5)

## 2020-06-11 LAB — POC URINE PREG, ED: Preg Test, Ur: NEGATIVE

## 2020-06-11 NOTE — Discharge Instructions (Signed)
Labs pending 2-3 days you will be called if any result is positive  Do not have sex until labs result and/or treatment is completed  Follow up gynecologist for symptoms that continue

## 2020-06-11 NOTE — ED Provider Notes (Signed)
[facility]    CSN: [id number] Arrival date & time: 06/11/20  1854      History   Chief Complaint Chief Complaint  Patient presents with  . Abdominal Pain    HPI HELIA HAESE is a 25 y.o. female.   Patient presents with urinary frequency, urgency, lower abdominal pain ,flank pain and thin [place] discharge for 1 week. Denies odor, itching, rash, irritation, vaginal pain. On depo shot for birth control. Last shot given around March 7. Taken on time. Only has menses the week prior to [facility] due. Pregnancy test taken at home positive. Sexually active with one partner, unprotected.   Past Medical History:  Diagnosis Date  . Anemia   . Iron deficiency     Patient Active Problem List   Diagnosis Date Noted  . Normal postpartum course 07/12/2018  . Normal labor 07/10/2018  . SVD (spontaneous vaginal delivery) 07/10/2018  . Chlamydia infection affecting pregnancy in first trimester 11/20/2017  . Spontaneous vaginal delivery 05/21/2015  . Rubella non-immune status, antepartum 05/21/2015  . Anemia of pregnancy 05/21/2015    Past Surgical History:  Procedure Laterality Date  . NO PAST SURGERIES      OB History    Gravida  3   Para  3   Term  3   Preterm      AB      Living  3     SAB      IAB      Ectopic      Multiple  0   Live Births  3            Home Medications    Prior to Admission medications   Medication Sig Start Date End Date Taking? Authorizing Provider  acetaminophen (TYLENOL) 325 MG tablet Take 650 mg by mouth every 6 (six) hours as needed for mild pain or headache.     [provider]  erythromycin ophthalmic ointment Place a 1/2 inch ribbon of ointment into the lower eyelid BID prn. 01/12/20   Particia Nearing, PA-C  ibuprofen (ADVIL) 600 MG tablet Take 1 tablet (600 mg total) by mouth every 6 (six) hours. 07/12/18   [place], Lesly Rubenstein, FNP  IRON PO Take 1 tablet by mouth daily.    [provider]   medroxyPROGESTERone (DEPO-PROVERA) 150 MG/ML injection Inject 150 mg into the muscle every 3 (three) months.    [provider]  Prenatal Vit-Fe Fumarate-FA (PRENATAL MULTIVITAMIN) TABS tablet Take 1 tablet by mouth daily at 12 noon.    [provider]    Family History Family History  Problem Relation Age of Onset  . Hypertension Mother     Social History Social History   Tobacco Use  . Smoking status: Current Some Day Smoker    Types: Cigars  . Smokeless tobacco: Never Used  . Tobacco comment: <5 cigars a day  Substance Use Topics  . Alcohol use: No  . Drug use: Yes    Types: Marijuana    Comment: 1 month ago     Allergies   Patient has no known allergies.   Review of Systems Review of Systems  Constitutional: Negative.   Respiratory: Negative.   Cardiovascular: Negative.   Genitourinary: Positive for flank pain, frequency and urgency. Negative for decreased urine volume, difficulty urinating, dyspareunia, dysuria, enuresis, genital sores, hematuria, menstrual problem, pelvic pain, vaginal bleeding, vaginal discharge and vaginal pain.  Skin: Negative.      Physical Exam Triage Vital  Signs ED Triage Vitals  Enc Vitals Group     BP 06/11/20 1916 115/68     Pulse Rate 06/11/20 1916 74     Resp 06/11/20 1916 18     Temp 06/11/20 1916 98.7 F (37.1 C)     Temp Source 06/11/20 1916 Oral     SpO2 06/11/20 1916 100 %     Weight --      Height --      Head Circumference --      Peak Flow --      Pain Score 06/11/20 1914 8     Pain Loc --      Pain Edu? --      Excl. in GC? --    No data found.  Updated Vital Signs BP 115/68 (BP Location: Right Arm)   Pulse 74   Temp 98.7 F (37.1 C) (Oral)   Resp 18   LMP 05/05/2020   SpO2 100%   Visual Acuity Right Eye Distance:   Left Eye Distance:   Bilateral Distance:    Right Eye Near:   Left Eye Near:    Bilateral Near:     Physical Exam Constitutional:      Appearance: She is  well-developed and normal weight.  HENT:     Head: Normocephalic.  Eyes:     Extraocular Movements: Extraocular movements intact.  Pulmonary:     Effort: Pulmonary effort is normal.  Abdominal:     General: Abdomen is flat. Bowel sounds are normal.     Palpations: Abdomen is soft.     Tenderness: There is abdominal tenderness in the suprapubic area. There is no right CVA tenderness, left CVA tenderness, guarding or rebound.  Musculoskeletal:        General: Normal range of motion.     Cervical back: Normal range of motion.  Skin:    General: Skin is warm and dry.  Neurological:     General: No focal deficit present.     Mental Status: She is alert and oriented to person, place, and time.  Psychiatric:        Mood and Affect: Mood normal.        Behavior: Behavior normal.      UC Treatments / Results  Labs (all labs ordered are listed, but only abnormal results are displayed) Labs Reviewed  HCG, QUANTITATIVE, PREGNANCY  POC URINE PREG, ED  POCT URINALYSIS DIPSTICK, ED / UC  CERVICOVAGINAL ANCILLARY ONLY    EKG   Radiology No results found.  Procedures Procedures (including critical care time)  Medications Ordered in UC Medications - No data to display  Initial Impression / Assessment and Plan / UC Course  I have reviewed the triage vital signs and the nursing notes.  Pertinent labs & imaging results that were available during my care of the patient were reviewed by me and considered in my medical decision making (see chart for details).  Urinary frequency  1. sti screening- pending, will treat per protocol, advised abstinence until lab results and/or treatment complete 2. Urine pregnancy- negative 3. hcg level- pending 4. Urinalysis- negative 5. Follow up with gynecology for persistent symptoms  Final Clinical Impressions(s) / UC Diagnoses   Final diagnoses:  Urinary frequency     Discharge Instructions     Labs pending 2-3 days you will be called  if any result is positive  Do not have sex until labs result and/or treatment is completed  Follow up gynecologist for symptoms that  continue    ED Prescriptions    None     PDMP not reviewed this encounter.   Valinda Hoar, NP 06/11/20 1943

## 2020-06-11 NOTE — ED Triage Notes (Signed)
Pt reports ABD pain for one months with cramping and Heart burn. Pt wants std check and pregnancy test

## 2020-06-12 ENCOUNTER — Other Ambulatory Visit: Payer: Self-pay

## 2020-06-12 ENCOUNTER — Emergency Department (HOSPITAL_COMMUNITY)
Admission: EM | Admit: 2020-06-12 | Discharge: 2020-06-12 | Disposition: A | Discharge status: Home / Self Care | Payer: Medicaid Other | Attending: Emergency Medicine | Admitting: Emergency Medicine

## 2020-06-12 ENCOUNTER — Encounter (HOSPITAL_COMMUNITY): Payer: Self-pay | Admitting: Emergency Medicine

## 2020-06-12 DIAGNOSIS — R1030 Lower abdominal pain, unspecified: Secondary | ICD-10-CM | POA: Insufficient documentation

## 2020-06-12 DIAGNOSIS — N939 Abnormal uterine and vaginal bleeding, unspecified: Secondary | ICD-10-CM | POA: Diagnosis not present

## 2020-06-12 DIAGNOSIS — X58XXXA Exposure to other specified factors, initial encounter: Secondary | ICD-10-CM | POA: Diagnosis not present

## 2020-06-12 DIAGNOSIS — F17290 Nicotine dependence, other tobacco product, uncomplicated: Secondary | ICD-10-CM | POA: Insufficient documentation

## 2020-06-12 DIAGNOSIS — S3992XA Unspecified injury of lower back, initial encounter: Secondary | ICD-10-CM | POA: Diagnosis present

## 2020-06-12 DIAGNOSIS — S39012A Strain of muscle, fascia and tendon of lower back, initial encounter: Secondary | ICD-10-CM | POA: Diagnosis not present

## 2020-06-12 LAB — CBC WITH DIFFERENTIAL/PLATELET
Abs Immature Granulocytes: 0 10*3/uL (ref 0.00–0.07)
Basophils Absolute: 0 10*3/uL (ref 0.0–0.1)
Basophils Relative: 0 %
Eosinophils Absolute: 0.1 10*3/uL (ref 0.0–0.5)
Eosinophils Relative: 2 %
HCT: 41.7 % (ref 36.0–46.0)
Hemoglobin: 13.6 g/dL (ref 12.0–15.0)
Immature Granulocytes: 0 %
Lymphocytes Relative: 57 %
Lymphs Abs: 2.2 10*3/uL (ref 0.7–4.0)
MCH: 28.5 pg (ref 26.0–34.0)
MCHC: 32.6 g/dL (ref 30.0–36.0)
MCV: 87.2 fL (ref 80.0–100.0)
Monocytes Absolute: 0.2 10*3/uL (ref 0.1–1.0)
Monocytes Relative: 4 %
Neutro Abs: 1.4 10*3/uL — ABNORMAL LOW (ref 1.7–7.7)
Neutrophils Relative %: 37 %
Platelets: 226 10*3/uL (ref 150–400)
RBC: 4.78 MIL/uL (ref 3.87–5.11)
RDW: 12.3 % (ref 11.5–15.5)
WBC: 3.9 10*3/uL — ABNORMAL LOW (ref 4.0–10.5)
nRBC: 0 % (ref 0.0–0.2)

## 2020-06-12 LAB — POC URINE PREG, ED: Preg Test, Ur: NEGATIVE

## 2020-06-12 LAB — COMPREHENSIVE METABOLIC PANEL
ALT: 12 U/L (ref 0–44)
AST: 12 U/L — ABNORMAL LOW (ref 15–41)
Albumin: 4.4 g/dL (ref 3.5–5.0)
Alkaline Phosphatase: 51 U/L (ref 38–126)
Anion gap: 9 (ref 5–15)
BUN: 5 mg/dL — ABNORMAL LOW (ref 6–20)
CO2: 25 mmol/L (ref 22–32)
Calcium: 9.6 mg/dL (ref 8.9–10.3)
Chloride: 106 mmol/L (ref 98–111)
Creatinine, Ser: 0.89 mg/dL (ref 0.44–1.00)
GFR, Estimated: >60 mL/min (ref >60)
Glucose, Bld: 100 mg/dL — ABNORMAL HIGH (ref 70–99)
Potassium: 3.3 mmol/L — ABNORMAL LOW (ref 3.5–5.1)
Sodium: 140 mmol/L (ref 135–145)
Total Bilirubin: 0.7 mg/dL (ref 0.3–1.2)
Total Protein: 7.4 g/dL (ref 6.5–8.1)

## 2020-06-12 LAB — CERVICOVAGINAL ANCILLARY ONLY
Bacterial Vaginitis (gardnerella): POSITIVE — AB
Candida Glabrata: NEGATIVE
Candida Vaginitis: NEGATIVE
Chlamydia: NEGATIVE
Comment: NEGATIVE
Comment: NEGATIVE
Comment: NEGATIVE
Comment: NEGATIVE
Comment: NEGATIVE
Comment: NORMAL
Neisseria Gonorrhea: NEGATIVE
Trichomonas: NEGATIVE

## 2020-06-12 LAB — LIPASE, BLOOD: Lipase: 30 U/L (ref 11–51)

## 2020-06-12 MED ORDER — METHOCARBAMOL 500 MG PO TABS: 500.0000 mg | ORAL_TABLET | Freq: Two times a day (BID) | ORAL | 0 refills | Status: DC

## 2020-06-12 NOTE — ED Triage Notes (Signed)
Patient from home, complaint of abdominal pain and vaginal bleeding off and on for one month. Pt A&Ox4. VSS.

## 2020-06-12 NOTE — ED Triage Notes (Signed)
Emergency Medicine Provider Triage Evaluation Note  Christy Gonzalez , a 25 y.o. female  was evaluated in triage.  Pt complains of intermittent vaginal bleeding, suprapubic pain, and lumbar back pain. Described bleeding as spotting.   Intermittent over the last month.  Received deppo shot in March. Seen at [facility] urgent care yesterday.    Review of Systems  Positive: Abdominal pain, vaginal bleeding,  Negative: Fever, chills, nausea, vomiting, diarrhea, constipation, dysuria, hematuria   Physical Exam  BP 124/86 (BP Location: Right Arm)   Pulse 80   Temp 99.4 F (37.4 C) (Oral)   Resp 18   SpO2 98%  Gen:   Awake, no distress   HEENT:  Atraumatic  Resp:  Normal effort  Cardiac:  Normal rate  Abd:   Nondistended, nontender, soft MSK:   Moves extremities without difficulty  Neuro:  Speech clear   Medical Decision Making  Medically screening exam initiated at 3:09 PM.  Appropriate orders placed.  Meyer Russel Denbow was informed that the remainder of the evaluation will be completed by another provider, this initial triage assessment does not replace that evaluation, and the importance of remaining in the ED until their evaluation is complete.  Clinical Impression   Vaginal bleeding, suprapubic pain.  The patient appears stable so that the remainder of the work up may be completed by another provider.     Haskel Schroeder, [place] 06/12/20 1520

## 2020-06-12 NOTE — ED Notes (Signed)
Patient given discharge paperwork and prescription. Verbalized understanding of teaching. No IV access. Ambulatory to exit in NAD with steady gait. 

## 2020-06-12 NOTE — ED Provider Notes (Signed)
[facility] EMERGENCY DEPARTMENT Provider Note   CSN: [id number] Arrival date & time: 06/12/20  1455     History Chief Complaint  Patient presents with  . Abdominal Pain  . Vaginal Bleeding    Christy Gonzalez is a 25 y.o. female with friend past medical history of iron deficiency anemia that presents the emerge department today for 2 complaints.  First complaint is that patient is having intermittent vaginal bleeding.  Patient states that she is had intermittent vaginal bleeding for a month, after she got the Depo shot in March.  Patient states that she is been getting the Depo shot for multiple years, as is never happened her before denies any vaginal pain, chance of STDs.  Denies any vaginal itching, vaginal discharge that is abnormal, pelvic pain, dyspareunia.  Denies any dysuria or hematuria.  States that she does have some slight suprapubic pain throughout the month as well, cramp-like sensation.  States that it is more of a spotting, last a couple days and then will go away for the next couple of days, has been like this for the past month.  States that her last normal menstrual cycle was over a year ago, states that it has been very irregular with the Depo shot.  Denies any fevers or chills.  Patient states that she is also had back pain that spans across her entire lumbar region for the past multiple months, has not been evaluated for this.  Denies any IV drug use, fevers, trauma, saddle paresthesias, numbness or tingling, radiation down to her leg.  States that this is intermittent, not currently having this.  Patient was seen in urgent care yesterday, at that time had STI screening, patient does not want this again.  Also had negative urine pregnancy test and negative urinalysis.Marland Kitchen  HPI     Past Medical History:  Diagnosis Date  . Anemia   . Iron deficiency     Patient Active Problem List   Diagnosis Date Noted  . Normal postpartum course 07/12/2018  . Normal  labor 07/10/2018  . SVD (spontaneous vaginal delivery) 07/10/2018  . Chlamydia infection affecting pregnancy in first trimester 11/20/2017  . Spontaneous vaginal delivery 05/21/2015  . Rubella non-immune status, antepartum 05/21/2015  . Anemia of pregnancy 05/21/2015    Past Surgical History:  Procedure Laterality Date  . NO PAST SURGERIES       OB History    Gravida  3   Para  3   Term  3   Preterm      AB      Living  3     SAB      IAB      Ectopic      Multiple  0   Live Births  3           Family History  Problem Relation Age of Onset  . Hypertension Mother     Social History   Tobacco Use  . Smoking status: Current Some Day Smoker    Types: Cigars  . Smokeless tobacco: Never Used  . Tobacco comment: <5 cigars a day  Substance Use Topics  . Alcohol use: No  . Drug use: Yes    Types: Marijuana    Comment: 1 month ago    Home Medications Prior to Admission medications   Medication Sig Start Date End Date Taking? Authorizing Provider  methocarbamol (ROBAXIN) 500 MG tablet Take 1 tablet (500 mg total) by mouth 2 (two) times  daily. 06/12/20  Yes Farrel Gordon, PA-C  acetaminophen (TYLENOL) 325 MG tablet Take 650 mg by mouth every 6 (six) hours as needed for mild pain or headache.     [provider]  erythromycin ophthalmic ointment Place a 1/2 inch ribbon of ointment into the lower eyelid BID prn. 01/12/20   Particia Nearing, PA-C  ibuprofen (ADVIL) 600 MG tablet Take 1 tablet (600 mg total) by mouth every 6 (six) hours. 07/12/18   [place], Lesly Rubenstein, FNP  IRON PO Take 1 tablet by mouth daily.    [provider]  medroxyPROGESTERone (DEPO-PROVERA) 150 MG/ML injection Inject 150 mg into the muscle every 3 (three) months.    [provider]  Prenatal Vit-Fe Fumarate-FA (PRENATAL MULTIVITAMIN) TABS tablet Take 1 tablet by mouth daily at 12 noon.    [provider]    Allergies    Patient has no known  allergies.  Review of Systems   Review of Systems  Constitutional: Negative for chills, diaphoresis, fatigue and fever.  HENT: Negative for congestion, sore throat and trouble swallowing.   Eyes: Negative for pain and visual disturbance.  Respiratory: Negative for cough, shortness of breath and wheezing.   Cardiovascular: Negative for chest pain, palpitations and leg swelling.  Gastrointestinal: Positive for abdominal pain. Negative for abdominal distention, diarrhea, nausea and vomiting.  Genitourinary: Positive for menstrual problem and vaginal bleeding. Negative for difficulty urinating, flank pain, frequency, genital sores, vaginal discharge and vaginal pain.  Musculoskeletal: Positive for back pain. Negative for neck pain and neck stiffness.  Skin: Negative for pallor.  Neurological: Negative for dizziness, speech difficulty, weakness and headaches.  Psychiatric/Behavioral: Negative for confusion.    Physical Exam Updated Vital Signs BP 119/90   Pulse 70   Temp 98.8 F (37.1 C) (Oral)   Resp 16   Ht 5\' 4"  (1.626 m)   Wt 61.2 kg   SpO2 98%   BMI 23.17 kg/m   Physical Exam Constitutional:      General: She is not in acute distress.    Appearance: Normal appearance. She is not ill-appearing, toxic-appearing or diaphoretic.  HENT:     Mouth/Throat:     Mouth: Mucous membranes are moist.     Pharynx: Oropharynx is clear.  Eyes:     General: No scleral icterus.    Extraocular Movements: Extraocular movements intact.     Pupils: Pupils are equal, round, and reactive to light.  Cardiovascular:     Rate and Rhythm: Normal rate and regular rhythm.     Pulses: Normal pulses.     Heart sounds: Normal heart sounds.  Pulmonary:     Effort: Pulmonary effort is normal. No respiratory distress.     Breath sounds: Normal breath sounds. No stridor. No wheezing, rhonchi or rales.  Chest:     Chest wall: No tenderness.  Abdominal:     General: Abdomen is flat. There is no  distension.     Palpations: Abdomen is soft.     Tenderness: There is no abdominal tenderness. There is no guarding or rebound.     Comments: No abdominal tenderness.  Genitourinary:    Comments: Chaperone present.  Patient with no vaginal external abnormalities.  Vaginal canal without any lesions, rugae present.  Cervix without any lesions, cysts.  No vaginal bleeding noted.  Cervix is closed.  No cervical motion tenderness or adnexal tenderness. Musculoskeletal:        General: No swelling or tenderness. Normal range of motion.  Cervical back: Normal range of motion and neck supple. No rigidity.       Back:     Right lower leg: No edema.     Left lower leg: No edema.     Comments: Tenderness that spans throughout lower back, crossing midline.  No pinpoint tenderness, no objective numbness.  Skin:    General: Skin is warm and dry.     Capillary Refill: Capillary refill takes less than 2 seconds.     Coloration: Skin is not pale.  Neurological:     General: No focal deficit present.     Mental Status: She is alert and oriented to person, place, and time.  Psychiatric:        Mood and Affect: Mood normal.        Behavior: Behavior normal.     ED Results / Procedures / Treatments   Labs (all labs ordered are listed, but only abnormal results are displayed) Labs Reviewed  CBC WITH DIFFERENTIAL/PLATELET - Abnormal; Notable for the following components:      Result Value   WBC 3.9 (*)    Neutro Abs 1.4 (*)    All other components within normal limits  COMPREHENSIVE METABOLIC PANEL - Abnormal; Notable for the following components:   Potassium 3.3 (*)    Glucose, Bld 100 (*)    BUN 5 (*)    AST 12 (*)    All other components within normal limits  WET PREP, GENITAL  LIPASE, BLOOD  POC URINE PREG, ED    EKG None  Radiology No results found.  Procedures Procedures   Medications Ordered in ED Medications - No data to display  ED Course  I have reviewed the triage  vital signs and the nursing notes.  Pertinent labs & imaging results that were available during my care of the patient were reviewed by me and considered in my medical decision making (see chart for details).    MDM Rules/Calculators/A&P                         Christy SettlerJaquasha L Levert is a 25 y.o. female with friend past medical history of iron deficiency anemia that presents the emerge department today for intermittent vaginal bleeding for the past month after getting the Depo shot.  Is not currently bleeding.  Hemoglobin stable.  Normal pelvic exam.  Work-up today unremarkable.  No abdominal tenderness on exam, UA done yesterday which was negative for infection.  Negative pregnancy test.  Will patient follow-up with GYN for this.  Most likely abnormal uterine bleeding, could be related to Depo shot.  Wet prep was not done in the wrong tube, did discuss with the patient, she does not want to do recollect.  Did not see anything concerning on pelvic exam.   In regards to back pain, no red flag symptoms.  Appears musculoskeletal in nature.  Not currently having pain. Symptomatic treatment discussed.  Patient will follow up with GYN.  Patient comfortable with this plan, to be discharged at this time.  Doubt need for further emergent work up at this time. I explained the diagnosis and have given explicit precautions to return to the ER including for any other new or worsening symptoms. The patient understands and accepts the medical plan as it's been dictated and I have answered their questions. Discharge instructions concerning home care and prescriptions have been given. The patient is STABLE and is discharged to home in good condition.  Final Clinical Impression(s) / ED Diagnoses Final diagnoses:  Abnormal uterine bleeding  Strain of lumbar region, initial encounter    Rx / DC Orders ED Discharge Orders         Ordered    methocarbamol (ROBAXIN) 500 MG tablet  2 times daily        06/12/20 1806            Farrel Gordon, PA-C 06/12/20 1917    Koleen Distance, MD 06/13/20 [phone]

## 2020-06-12 NOTE — Discharge Instructions (Addendum)
Please follow-up with a gynecologist, if you do not have any good the [facility] outpatient clinic, the information is provided above. Your work-up today was reassuring. In regards to your back pain, you most likely have a lumbar strain, please use the attached instructions, use Robaxin.  Please speak to your pharmacist about any new medications prescribed today.

## 2020-06-15 ENCOUNTER — Telehealth (HOSPITAL_COMMUNITY): Payer: Self-pay | Admitting: Emergency Medicine

## 2020-06-15 MED ORDER — METRONIDAZOLE 500 MG PO TABS: 500.0000 mg | ORAL_TABLET | Freq: Two times a day (BID) | ORAL | 0 refills | Status: DC

## 2020-06-15 NOTE — MAU Note (Signed)
Lab called inquiring about a covid swab with no order. Pt chart accessed, pt was not seen in MAU recently and lab made aware.

## 2020-10-31 IMAGING — US US MFM OB COMP +14 WKS
1 series · 13 of 28 positions shown · non-contrast
Comparison: none

[Series 1: us mfm ob comp +14 wks · 13 of 91 slices shown]
[im 4/91]
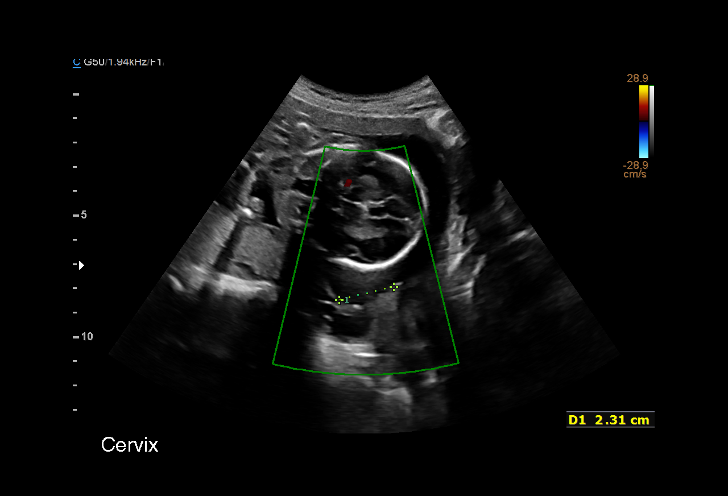
[im 11/91]
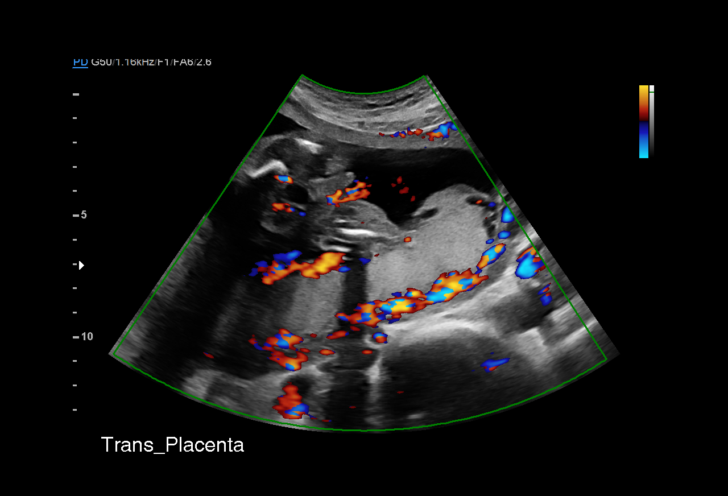
[im 17/91]
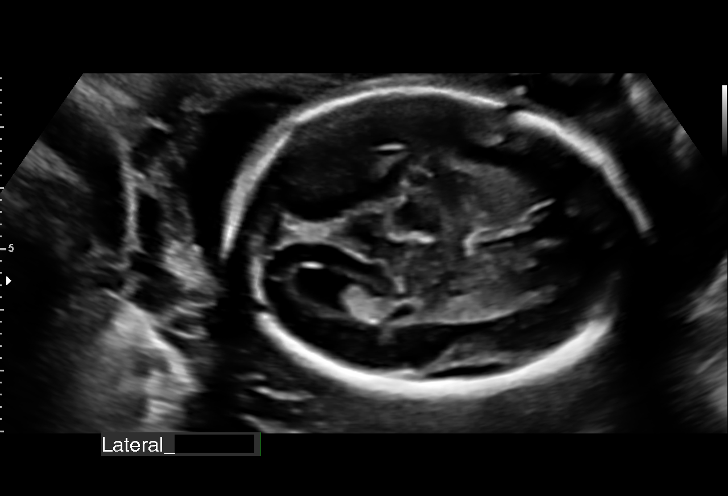
[im 24/91]
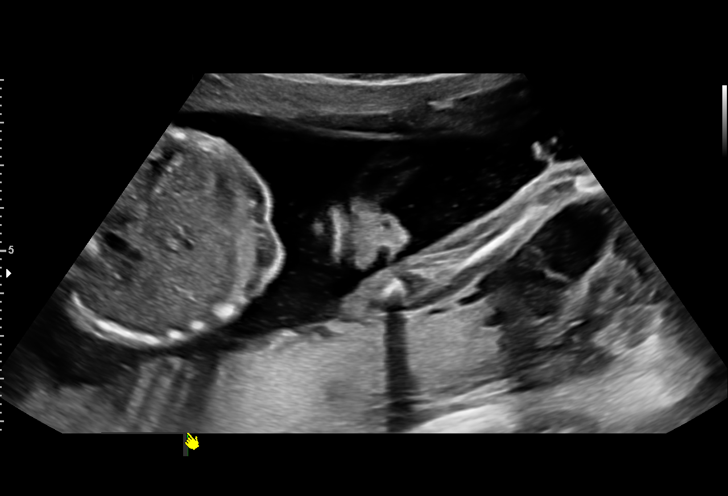
[im 31/91]
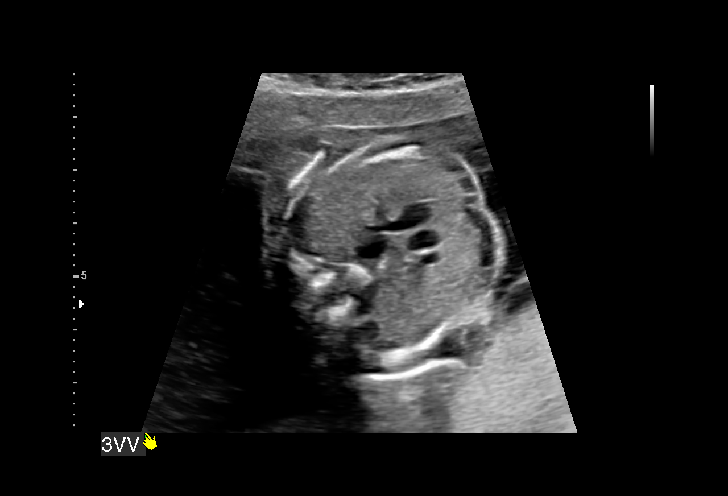
[im 37/91]
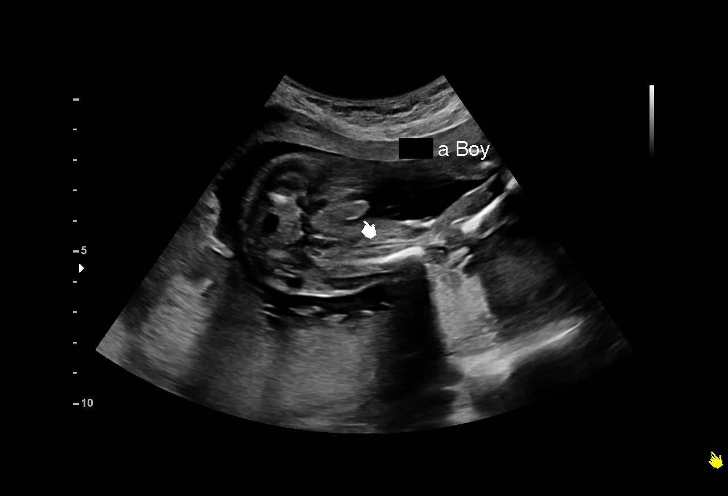
[im 47/91]
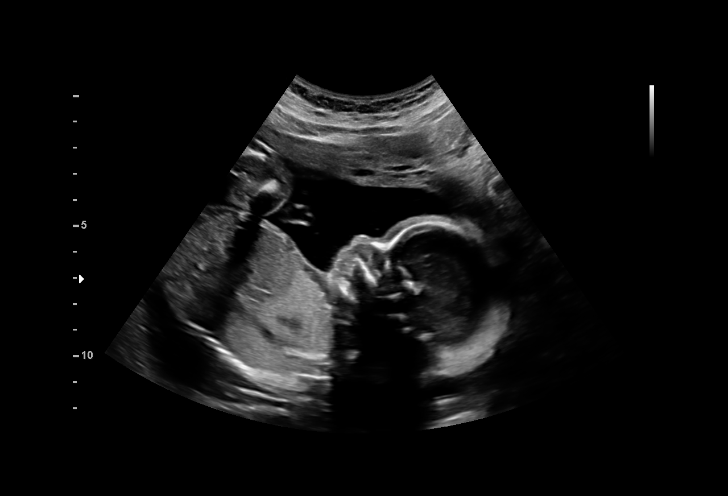
[im 54/91]
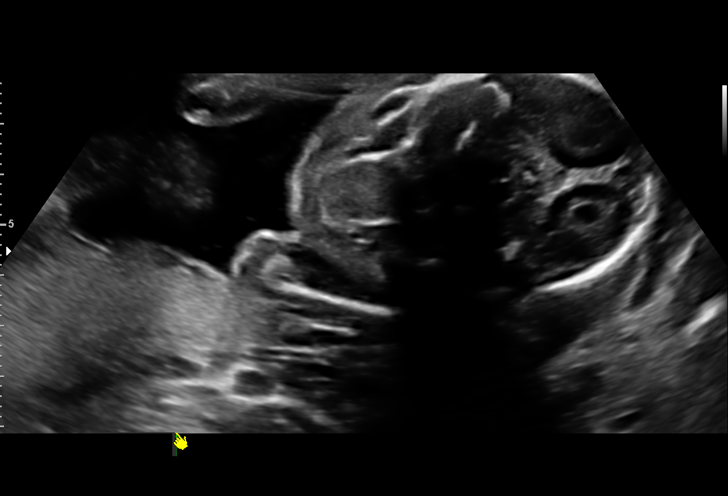
[im 61/91]
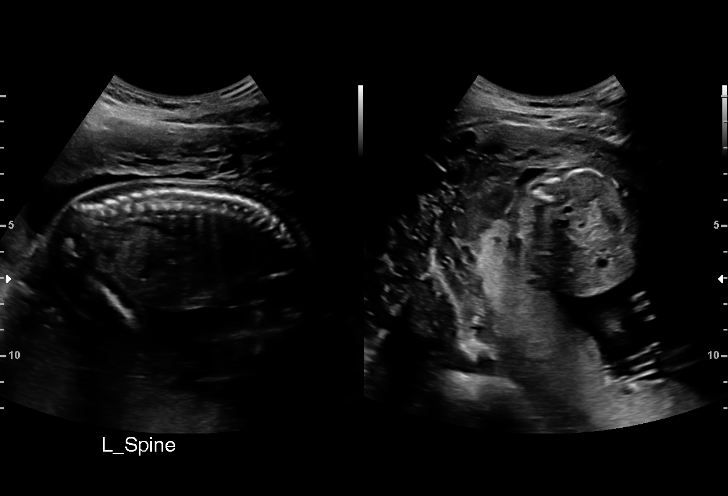
[im 67/91]
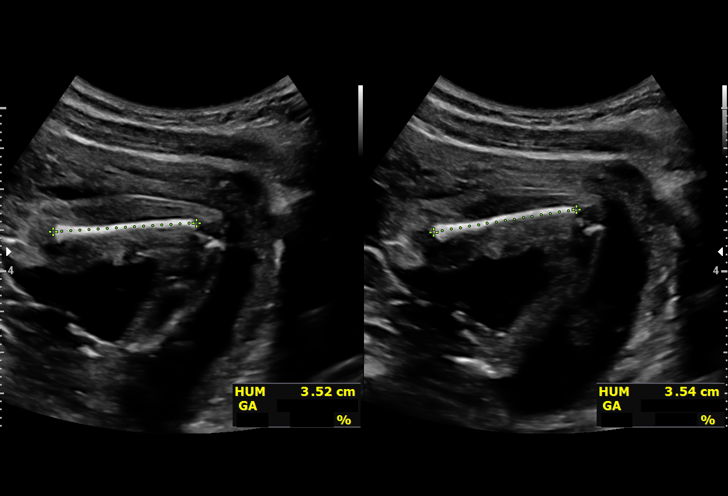
[im 74/91]
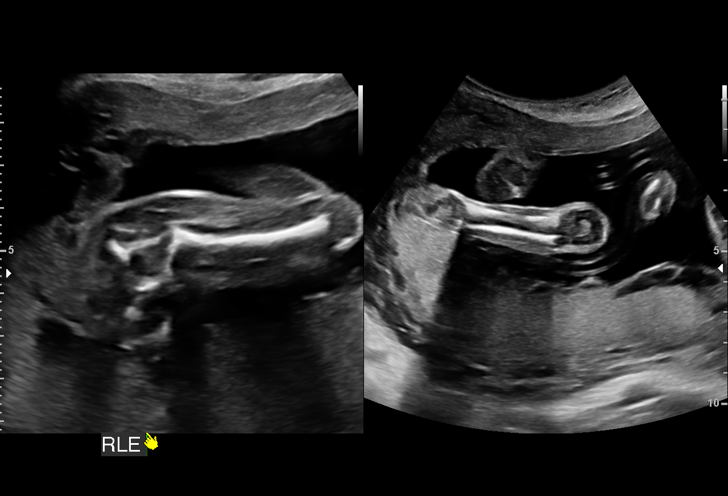
[im 81/91]
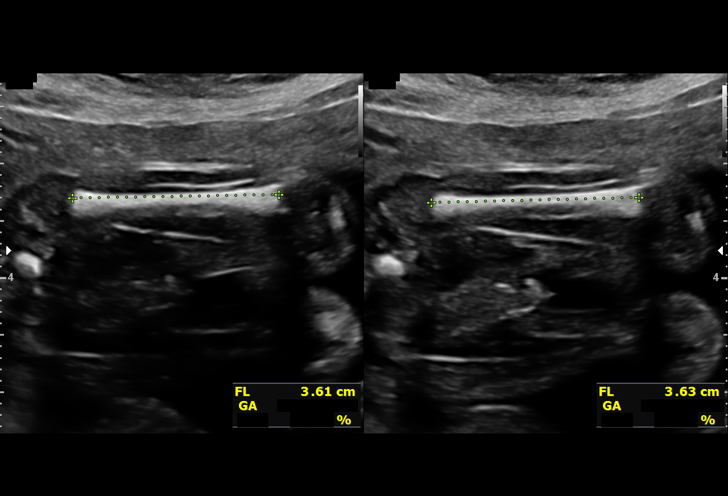
[im 87/91]
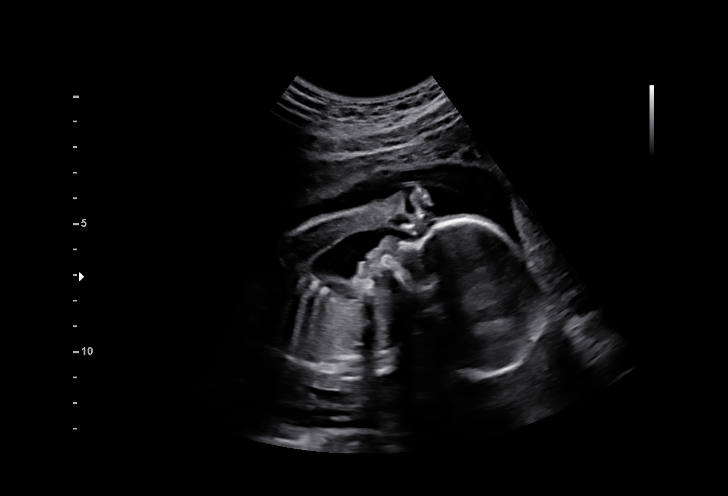

[13 of 28 positions shown; findings below may reference images not displayed]

Obstetrics &
                                                            Gynecology
                                                            [id number]
                                                            [place]
                   [place]

  1  US MFM OB COMP + 14 WK               [address].01     LUCAS KIRSCHNER
 ----------------------------------------------------------------------

 ----------------------------------------------------------------------
Indications

  21 weeks gestation of pregnancy
  Antenatal screening for malformations
 ----------------------------------------------------------------------
Fetal Evaluation

 Num Of Fetuses:         1
 Fetal Heart Rate(bpm):  158
 Cardiac Activity:       Observed
 Presentation:           Cephalic
 Placenta:               Posterior
 P. Cord Insertion:      Visualized

 Amniotic Fluid
 AFI FV:      Within normal limits

                             Largest Pocket(cm)

Biometry

 BPD:      48.9  mm     G. Age:  20w 5d         29  %    CI:        68.78   %    70 - 86
                                                         FL/HC:      19.4   %    15.9 -
 HC:      188.4  mm     G. Age:  21w 1d         34  %    HC/AC:      1.15        1.06 -
 AC:      163.8  mm     G. Age:  21w 3d         48  %    FL/BPD:     74.8   %
 FL:       36.6  mm     G. Age:  21w 4d         53  %    FL/AC:      22.3   %    20 - 24
 HUM:      34.8  mm     G. Age:  21w 6d         64  %
 CER:      22.4  mm     G. Age:  21w 1d         47  %
 CM:        4.2  mm
 Est. FW:     426  gm    0 lb 15 oz      49  %
OB History

 Gravidity:    3         Term:   2        Prem:   0        SAB:   0
 TOP:          0       Ectopic:  0        Living: 2
Gestational Age

 LMP:           21w 2d        Date:  10/04/17                 EDD:   07/11/18
 U/S Today:     21w 2d                                        EDD:   07/11/18
 Best:          21w 2d     Det. By:  LMP  (10/04/17)          EDD:   07/11/18
Anatomy

 Cranium:               Appears normal         LVOT:                   Appears normal
 Cavum:                 Appears normal         Aortic Arch:            Appears normal
 Ventricles:            Appears normal         Ductal Arch:            Appears normal
 Choroid Plexus:        Appears normal         Diaphragm:              Appears normal
 Cerebellum:            Appears normal         Stomach:                Appears normal, left
                                                                       sided
 Posterior Fossa:       Appears normal         Abdomen:                Appears normal
 Nuchal Fold:           Appears normal         Abdominal Wall:         Appears nml (cord
                                                                       insert, abd wall)
 Face:                  Appears normal         Cord Vessels:           Appears normal (3
                        (orbits and profile)                           vessel cord)
 Lips:                  Appears normal         Kidneys:                Appear normal
 Palate:                Appears normal         Bladder:                Appears normal
 Thoracic:              Appears normal         Spine:                  Appears normal
 Heart:                 Appears normal         Upper Extremities:      Appears normal
                        (4CH, axis, and
                        situs)
 RVOT:                  Appears normal         Lower Extremities:      Appears normal

 Other:  Heels and 5th digit visualized. Nasal bone visualized.
Cervix Uterus Adnexa

 Cervix
 Length:            3.2  cm.
 Normal appearance by transabdominal scan.

 Uterus
 No abnormality visualized.

 Left Ovary
 Within normal limits.

 Right Ovary
 Within normal limits.

 Cul De Sac
 No free fluid seen.

 Adnexa
 No abnormality visualized.
Comments

 U/S images reviewed. Appropriate fetal growth is seen.  No
 fetal abnormalities are identified.
 Recommendations: 1) Follow-up as clinically indicated
Recommendations

  1) Follow-up as clinically indicated
              Ronlor, Ingunn Harpa

## 2021-03-11 ENCOUNTER — Other Ambulatory Visit: Payer: Self-pay

## 2021-03-11 ENCOUNTER — Encounter (HOSPITAL_COMMUNITY): Payer: Self-pay

## 2021-03-11 ENCOUNTER — Ambulatory Visit (HOSPITAL_COMMUNITY)
Admission: EM | Admit: 2021-03-11 | Discharge: 2021-03-11 | Disposition: A | Discharge status: Home / Self Care | Payer: Medicaid Other | Attending: Emergency Medicine | Admitting: Emergency Medicine

## 2021-03-11 DIAGNOSIS — N898 Other specified noninflammatory disorders of vagina: Secondary | ICD-10-CM | POA: Diagnosis not present

## 2021-03-11 DIAGNOSIS — Z3202 Encounter for pregnancy test, result negative: Secondary | ICD-10-CM | POA: Diagnosis not present

## 2021-03-11 LAB — POCT URINALYSIS DIPSTICK, ED / UC
Bilirubin Urine: NEGATIVE
Glucose, UA: NEGATIVE mg/dL
Hgb urine dipstick: NEGATIVE
Leukocytes,Ua: NEGATIVE
Nitrite: NEGATIVE
Protein, ur: NEGATIVE mg/dL
Specific Gravity, Urine: >=1.03 (ref 1.005–1.030)
Urobilinogen, UA: 1 mg/dL (ref 0.0–1.0)
pH: 6 (ref 5.0–8.0)

## 2021-03-11 LAB — POC URINE PREG, ED: Preg Test, Ur: NEGATIVE

## 2021-03-11 NOTE — Discharge Instructions (Signed)
Pregnancy test negative, urinalysis negative for infection   Monitor your period, the blood clots can be normal, if heavier bleeding occurs ( going through a tampon or pad in a hours) or blood clots increase in size, notify your gynecologist or go to the nearest emergency department for evaluation   Labs pending 2-3 days, you will be contacted if positive for any sti and treatment will be sent to the pharmacy, you will have to return to the clinic if positive for gonorrhea to receive treatment   Please refrain from having sex until labs results, if positive please refrain from having sex until treatment complete and symptoms resolve   If positive for , Chlamydia  gonorrhea or trichomoniasis please notify partner or partners so they may tested as well  Moving forward, it is recommended you use some form of protection against the transmission of sti infections  such as condoms or dental dams with each sexual encounter

## 2021-03-11 NOTE — ED Triage Notes (Signed)
Pt reports taking an at home pregnancy test. States it came out positive and c/o vaginal bleeding out of her cycle. States she noticed blood clots.

## 2021-03-11 NOTE — ED Provider Notes (Signed)
[facility]    CSN: [id number] Arrival date & time: 03/11/21  8657      History   Chief Complaint Chief Complaint  Patient presents with   Possible Pregnancy    HPI Christy Gonzalez is a 26 y.o. female.   Patient presents with possibility of pregnancy.  Endorses 2 weeks ago had a faint home pregnancy test.  Endorses that 2 days ago she began having spotting with clots present.  Blood clots are described as small and mucus-like.  Endorses regular menses, unknown last cycle.  Sexually active, no condom use, no birth control.  Patient concerned with vaginal discharge for 1 week, unsure of color.  Associated urinary frequency and urgency and intermittent lower abdominal cramping.  Denies vaginal odor, itching or irritation, hematuria, flank pain.    Past Medical History:  Diagnosis Date   Anemia    Iron deficiency     Patient Active Problem List   Diagnosis Date Noted   Normal postpartum course 07/12/2018   Normal labor 07/10/2018   SVD (spontaneous vaginal delivery) 07/10/2018   Chlamydia infection affecting pregnancy in first trimester 11/20/2017   Spontaneous vaginal delivery 05/21/2015   Rubella non-immune status, antepartum 05/21/2015   Anemia of pregnancy 05/21/2015    Past Surgical History:  Procedure Laterality Date   NO PAST SURGERIES      OB History     Gravida  3   Para  3   Term  3   Preterm      AB      Living  3      SAB      IAB      Ectopic      Multiple  0   Live Births  3            Home Medications    Prior to Admission medications   Medication Sig Start Date End Date Taking? Authorizing Provider  acetaminophen (TYLENOL) 325 MG tablet Take 650 mg by mouth every 6 (six) hours as needed for mild pain or headache.     [provider]  erythromycin ophthalmic ointment Place a 1/2 inch ribbon of ointment into the lower eyelid BID prn. 01/12/20   Particia Nearing, PA-C  ibuprofen (ADVIL) 600 MG  tablet Take 1 tablet (600 mg total) by mouth every 6 (six) hours. 07/12/18   [place], Lesly Rubenstein, FNP  IRON PO Take 1 tablet by mouth daily.    [provider]  medroxyPROGESTERone (DEPO-PROVERA) 150 MG/ML injection Inject 150 mg into the muscle every 3 (three) months.    [provider]  methocarbamol (ROBAXIN) 500 MG tablet Take 1 tablet (500 mg total) by mouth 2 (two) times daily. 06/12/20   Farrel Gordon, PA-C  metroNIDAZOLE (FLAGYL) 500 MG tablet Take 1 tablet (500 mg total) by mouth 2 (two) times daily. 06/15/20   Lamptey, Britta Mccreedy, MD  Prenatal Vit-Fe Fumarate-FA (PRENATAL MULTIVITAMIN) TABS tablet Take 1 tablet by mouth daily at 12 noon.    [provider]    Family History Family History  Problem Relation Age of Onset   Hypertension Mother     Social History Social History   Tobacco Use   Smoking status: Some Days    Types: Cigars   Smokeless tobacco: Never   Tobacco comments:    <5 cigars a day  Substance Use Topics   Alcohol use: No   Drug use: Yes    Types: Marijuana    Comment: 1  month ago     Allergies   Patient has no known allergies.   Review of Systems Review of Systems  Constitutional: Negative.   HENT: Negative.    Respiratory: Negative.    Cardiovascular: Negative.   Gastrointestinal:  Positive for abdominal pain. Negative for abdominal distention, anal bleeding, blood in stool, constipation, diarrhea, nausea, rectal pain and vomiting.  Genitourinary:  Positive for frequency, menstrual problem, urgency and vaginal discharge. Negative for decreased urine volume, difficulty urinating, dyspareunia, dysuria, enuresis, flank pain, genital sores, hematuria, pelvic pain, vaginal bleeding and vaginal pain.  Skin: Negative.   Neurological: Negative.     Physical Exam Triage Vital Signs ED Triage Vitals  Enc Vitals Group     BP 03/11/21 0841 105/74     Pulse Rate 03/11/21 0841 82     Resp 03/11/21 0841 17     Temp 03/11/21 0841 98.3 F  (36.8 C)     Temp Source 03/11/21 0841 Oral     SpO2 03/11/21 0841 97 %     Weight --      Height --      Head Circumference --      Peak Flow --      Pain Score 03/11/21 0840 4     Pain Loc --      Pain Edu? --      Excl. in [place]? --    No data found.  Updated Vital Signs BP 105/74 (BP Location: Right Arm)    Pulse 82    Temp 98.3 F (36.8 C) (Oral)    Resp 17    LMP  (LMP Unknown)    SpO2 97%   Visual Acuity Right Eye Distance:   Left Eye Distance:   Bilateral Distance:    Right Eye Near:   Left Eye Near:    Bilateral Near:     Physical Exam Constitutional:      Appearance: Normal appearance.  HENT:     Head: Normocephalic.  Eyes:     Extraocular Movements: Extraocular movements intact.  Pulmonary:     Effort: Pulmonary effort is normal.  Abdominal:     General: Abdomen is flat. Bowel sounds are normal.     Palpations: Abdomen is soft.  Genitourinary:    Comments: Deferred self vaginal swab Skin:    General: Skin is warm and dry.  Neurological:     Mental Status: She is alert and oriented to person, place, and time. Mental status is at baseline.  Psychiatric:        Mood and Affect: Mood normal.        Behavior: Behavior normal.     UC Treatments / Results  Labs (all labs ordered are listed, but only abnormal results are displayed) Labs Reviewed  POC URINE PREG, ED    EKG   Radiology No results found.  Procedures Procedures (including critical care time)  Medications Ordered in UC Medications - No data to display  Initial Impression / Assessment and Plan / UC Course  I have reviewed the triage vital signs and the nursing notes.  Pertinent labs & imaging results that were available during my care of the patient were reviewed by me and considered in my medical decision making (see chart for details).  Clinical Course as of 03/11/21 [id number]  Thu Mar 11, 2021  0907 POC urine pregnancy [AW]    Clinical Course User Index [AW] Hans Eden,  NP    Discharge Encounter for pregnancy test with result  negative  Urine pregnancy negative, urinalysis negative, STI screening pending, will treat per protocol, advised abstinence until all lab results and/or treatment is complete, advised condom use moving forward, patient has regular menses, description of blood clot does not seem abnormal, advised watchful wait with follow-up with gynecology as needed, given strict precautions for heavy bleeding (more than 1 pad or tampon use within an hour) for increasing sizing of blood clots to go to nearest emergency department for further evaluation, patient in agreement with plan of care Final Clinical Impressions(s) / UC Diagnoses   Final diagnoses:  None   Discharge Instructions   None    ED Prescriptions   None    PDMP not reviewed this encounter.   Hans Eden, NP 03/11/21 1010

## 2021-03-12 ENCOUNTER — Telehealth (HOSPITAL_COMMUNITY): Payer: Self-pay | Admitting: Emergency Medicine

## 2021-03-12 LAB — CERVICOVAGINAL ANCILLARY ONLY
Bacterial Vaginitis (gardnerella): NEGATIVE
Candida Glabrata: NEGATIVE
Candida Vaginitis: POSITIVE — AB
Chlamydia: POSITIVE — AB
Comment: NEGATIVE
Comment: NEGATIVE
Comment: NEGATIVE
Comment: NEGATIVE
Comment: NEGATIVE
Comment: NORMAL
Neisseria Gonorrhea: NEGATIVE
Trichomonas: POSITIVE — AB

## 2021-03-12 MED ORDER — DOXYCYCLINE HYCLATE 100 MG PO CAPS: 100.0000 mg | ORAL_CAPSULE | Freq: Two times a day (BID) | ORAL | 0 refills | Status: AC

## 2021-03-12 MED ORDER — METRONIDAZOLE 500 MG PO TABS: 500.0000 mg | ORAL_TABLET | Freq: Two times a day (BID) | ORAL | 0 refills | Status: DC

## 2021-03-12 MED ORDER — FLUCONAZOLE 150 MG PO TABS: 150.0000 mg | ORAL_TABLET | Freq: Once | ORAL | 0 refills | Status: AC

## 2021-10-01 ENCOUNTER — Ambulatory Visit
Admission: EM | Admit: 2021-10-01 | Discharge: 2021-10-01 | Disposition: A | Discharge status: Home / Self Care | Payer: Medicaid Other | Attending: Internal Medicine | Admitting: Internal Medicine

## 2021-10-01 DIAGNOSIS — R3915 Urgency of urination: Secondary | ICD-10-CM | POA: Insufficient documentation

## 2021-10-01 DIAGNOSIS — N898 Other specified noninflammatory disorders of vagina: Secondary | ICD-10-CM | POA: Diagnosis not present

## 2021-10-01 DIAGNOSIS — R350 Frequency of micturition: Secondary | ICD-10-CM | POA: Insufficient documentation

## 2021-10-01 LAB — POCT URINE PREGNANCY: Preg Test, Ur: NEGATIVE

## 2021-10-01 LAB — POCT URINALYSIS DIP (MANUAL ENTRY)
Bilirubin, UA: NEGATIVE
Blood, UA: NEGATIVE
Glucose, UA: NEGATIVE mg/dL
Ketones, POC UA: NEGATIVE mg/dL
Nitrite, UA: NEGATIVE
Protein Ur, POC: NEGATIVE mg/dL
Spec Grav, UA: 1.02 (ref 1.010–1.025)
Urobilinogen, UA: 1 E.U./dL
pH, UA: 7 (ref 5.0–8.0)

## 2021-10-01 NOTE — ED Triage Notes (Signed)
Pt c/o abd cramps, lower back pain, frequency. Onset ~ 3-7 days ago. Reports something about donating plasma and then menstrual cycle changing colors., somewhat vague explanation.

## 2021-10-01 NOTE — Discharge Instructions (Signed)
Your urine did have a few white blood cells but was otherwise normal.  We will send this for culture and contact you if we need to start any antibiotics.  We will contact you in a few days with your swab results if anything is positive we will need to arrange treatment.  Please monitor your MyChart for these results.  Make sure you rest and drink plenty of fluids.  If you have any worsening symptoms including pain with urination, abdominal pain, nausea, vomiting, fever, pelvic pain you need to be seen immediately.

## 2021-10-01 NOTE — ED Provider Notes (Signed)
[facility]    CSN: [id number] Arrival date & time: 10/01/21  1911      History   Chief Complaint Chief Complaint  Patient presents with   sti screening    HPI Christy Gonzalez is a 26 y.o. female.   Patient presents today with a several day history of UTI symptoms as well as vaginal discharge.  She reports urinary frequency, urinary urgency, some vaginal discharge.  Denies any pelvic pain, abdominal pain, fever, nausea, vomiting, dysuria,.  Reports that her symptoms began after she had a heavy menstrual cycle several weeks ago.  She is sexually active with female partners and does not consistently use condoms as she is in a monogamous relationship.  She has not tried any over-the-counter medication for symptom management.  She does have a history of UTIs but reports these are infrequent and she denies any recent antibiotic use.  Denies history of nephrolithiasis, recent urogenital procedure, self catheterization.  She declined HIV and syphilis testing but is requesting STI testing.    Past Medical History:  Diagnosis Date   Anemia    Iron deficiency     Patient Active Problem List   Diagnosis Date Noted   Normal postpartum course 07/12/2018   Normal labor 07/10/2018   SVD (spontaneous vaginal delivery) 07/10/2018   Chlamydia infection affecting pregnancy in first trimester 11/20/2017   Spontaneous vaginal delivery 05/21/2015   Rubella non-immune status, antepartum 05/21/2015   Anemia of pregnancy 05/21/2015    Past Surgical History:  Procedure Laterality Date   NO PAST SURGERIES      OB History     Gravida  3   Para  3   Term  3   Preterm      AB      Living  3      SAB      IAB      Ectopic      Multiple  0   Live Births  3            Home Medications    Prior to Admission medications   Not on File    Family History Family History  Problem Relation Age of Onset   Hypertension Mother     Social History Social  History   Tobacco Use   Smoking status: Some Days    Types: Cigars   Smokeless tobacco: Never   Tobacco comments:    <5 cigars a day  Substance Use Topics   Alcohol use: No   Drug use: Yes    Types: Marijuana    Comment: 1 month ago     Allergies   Patient has no known allergies.   Review of Systems Review of Systems  Constitutional:  Positive for activity change. Negative for appetite change, fatigue and fever.  Respiratory:  Negative for cough and shortness of breath.   Cardiovascular:  Negative for chest pain.  Gastrointestinal:  Negative for abdominal pain, diarrhea, nausea and vomiting.  Genitourinary:  Positive for frequency, urgency and vaginal discharge. Negative for dysuria, vaginal bleeding and vaginal pain.  Neurological:  Negative for dizziness, light-headedness and headaches.     Physical Exam Triage Vital Signs ED Triage Vitals  Enc Vitals Group     BP 10/01/21 1920 122/90     Pulse Rate 10/01/21 1920 87     Resp 10/01/21 1920 18     Temp 10/01/21 1920 98 F (36.7 C)     Temp Source 10/01/21 1920 Oral  SpO2 10/01/21 1920 98 %     Weight --      Height --      Head Circumference --      Peak Flow --      Pain Score 10/01/21 1921 0     Pain Loc --      Pain Edu? --      Excl. in [place]? --    No data found.  Updated Vital Signs BP 122/90 (BP Location: Left Arm)   Pulse 87   Temp 98 F (36.7 C) (Oral)   Resp 18   SpO2 98%   Breastfeeding No   Visual Acuity Right Eye Distance:   Left Eye Distance:   Bilateral Distance:    Right Eye Near:   Left Eye Near:    Bilateral Near:     Physical Exam Vitals reviewed.  Constitutional:      General: She is awake. She is not in acute distress.    Appearance: Normal appearance. She is well-developed. She is not ill-appearing.     Comments: Very pleasant female appears stated age in no acute distress sitting comfortably in exam room  HENT:     Head: Normocephalic and atraumatic.   Cardiovascular:     Rate and Rhythm: Normal rate and regular rhythm.     Heart sounds: Normal heart sounds, S1 normal and S2 normal. No murmur heard. Pulmonary:     Effort: Pulmonary effort is normal.     Breath sounds: Normal breath sounds. No wheezing, rhonchi or rales.     Comments: Clear to auscultation bilaterally Abdominal:     General: Bowel sounds are normal.     Palpations: Abdomen is soft.     Tenderness: There is no abdominal tenderness. There is no right CVA tenderness, left CVA tenderness, guarding or rebound.     Comments: Benign abdominal exam  Genitourinary:    Comments: Exam deferred Psychiatric:        Behavior: Behavior is cooperative.      UC Treatments / Results  Labs (all labs ordered are listed, but only abnormal results are displayed) Labs Reviewed  POCT URINALYSIS DIP (MANUAL ENTRY) - Abnormal; Notable for the following components:      Result Value   Leukocytes, UA Trace (*)    All other components within normal limits  URINE CULTURE  POCT URINE PREGNANCY  POCT URINE PREGNANCY  CERVICOVAGINAL ANCILLARY ONLY    EKG   Radiology No results found.  Procedures Procedures (including critical care time)  Medications Ordered in UC Medications - No data to display  Initial Impression / Assessment and Plan / UC Course  I have reviewed the triage vital signs and the nursing notes.  Pertinent labs & imaging results that were available during my care of the patient were reviewed by me and considered in my medical decision making (see chart for details).     Urine pregnancy test was negative in clinic today.  UA showed trace leukocyte esterase but no convincing evidence of infection.  We will send this for culture but defer antibiotics for the time being.  STI swab collected today-results pending.  She was instructed to avoid sexual contact until results are available.  She is to rest and drink plenty of fluid.  We will contact her if her urine  culture is positive for her STI panel is positive and we need to arrange treatment.  If her symptoms persist she is to return for reevaluation.  Discussed that if  she has any worsening symptoms including pelvic pain, abdominal pain, fever, nausea, vomiting she needs to be seen immediately.  Strict return precautions given.  Final Clinical Impressions(s) / UC Diagnoses   Final diagnoses:  Urinary frequency  Urinary urgency  Vaginal discharge     Discharge Instructions      Your urine did have a few white blood cells but was otherwise normal.  We will send this for culture and contact you if we need to start any antibiotics.  We will contact you in a few days with your swab results if anything is positive we will need to arrange treatment.  Please monitor your MyChart for these results.  Make sure you rest and drink plenty of fluids.  If you have any worsening symptoms including pain with urination, abdominal pain, nausea, vomiting, fever, pelvic pain you need to be seen immediately.     ED Prescriptions   None    PDMP not reviewed this encounter.   Jeani Hawking, PA-C 10/01/21 1952

## 2021-10-04 LAB — URINE CULTURE: Culture: 50000 — AB

## 2021-10-04 LAB — CERVICOVAGINAL ANCILLARY ONLY
Bacterial Vaginitis (gardnerella): NEGATIVE
Candida Glabrata: NEGATIVE
Candida Vaginitis: NEGATIVE
Chlamydia: NEGATIVE
Comment: NEGATIVE
Comment: NEGATIVE
Comment: NEGATIVE
Comment: NEGATIVE
Comment: NEGATIVE
Comment: NORMAL
Neisseria Gonorrhea: NEGATIVE
Trichomonas: POSITIVE — AB

## 2021-10-05 ENCOUNTER — Telehealth (HOSPITAL_COMMUNITY): Payer: Self-pay | Admitting: Emergency Medicine

## 2021-10-05 MED ORDER — METRONIDAZOLE 500 MG PO TABS: 500.0000 mg | ORAL_TABLET | Freq: Two times a day (BID) | ORAL | 0 refills | Status: DC

## 2022-01-04 ENCOUNTER — Ambulatory Visit
Admission: EM | Admit: 2022-01-04 | Discharge: 2022-01-04 | Disposition: A | Discharge status: Home / Self Care | Payer: Medicaid Other | Attending: Physician Assistant | Admitting: Physician Assistant

## 2022-01-04 ENCOUNTER — Encounter: Payer: Self-pay | Admitting: Emergency Medicine

## 2022-01-04 DIAGNOSIS — R599 Enlarged lymph nodes, unspecified: Secondary | ICD-10-CM

## 2022-01-04 DIAGNOSIS — H9201 Otalgia, right ear: Secondary | ICD-10-CM | POA: Diagnosis not present

## 2022-01-04 MED ORDER — IBUPROFEN 600 MG PO TABS: 600.0000 mg | ORAL_TABLET | Freq: Four times a day (QID) | ORAL | 0 refills | Status: DC | PRN

## 2022-01-04 NOTE — ED Triage Notes (Signed)
Pt is present today with a nodule on the left side of her head. Pt states she noticed it x2 days ago

## 2022-01-04 NOTE — Discharge Instructions (Signed)
Advised take ibuprofen 600 mg every 8 hours with food to help reduce pain and swelling. Advised to put cool compresses to the area, 10 minutes on 20 minutes off, 3-4 times throughout the evening to help reduce the swelling. Advised to follow-up with PCP or return to urgent care if symptoms fail to improve.

## 2022-01-04 NOTE — ED Provider Notes (Signed)
[facility]    CSN: [id number] Arrival date & time: 01/04/22  1554      History   Chief Complaint Chief Complaint  Patient presents with   nodule     HPI Christy Gonzalez is a 26 y.o. female.   26 year old female presents with nodule behind the right ear.  Patient indicates for the past 2 days she has noticed some swelling and tenderness behind her right ear.  Indicates that it is a nodular area and is tender to touch.  She indicates she has had some mild upper respiratory congestion and bilateral ear congestion for the past couple days.  She indicates she has not had any fever or chills.  Patient has not taken any medicine for pain relief.  Patient indicates she has not had any sores of the right side of the scalp.     Past Medical History:  Diagnosis Date   Anemia    Iron deficiency     Patient Active Problem List   Diagnosis Date Noted   Normal postpartum course 07/12/2018   Normal labor 07/10/2018   SVD (spontaneous vaginal delivery) 07/10/2018   Chlamydia infection affecting pregnancy in first trimester 11/20/2017   Spontaneous vaginal delivery 05/21/2015   Rubella non-immune status, antepartum 05/21/2015   Anemia of pregnancy 05/21/2015    Past Surgical History:  Procedure Laterality Date   NO PAST SURGERIES      OB History     Gravida  3   Para  3   Term  3   Preterm      AB      Living  3      SAB      IAB      Ectopic      Multiple  0   Live Births  3            Home Medications    Prior to Admission medications   Medication Sig Start Date End Date Taking? Authorizing Provider  ibuprofen (ADVIL) 600 MG tablet Take 1 tablet (600 mg total) by mouth every 6 (six) hours as needed. 01/04/22  Yes Nyoka Lint, PA-C  metroNIDAZOLE (FLAGYL) 500 MG tablet Take 1 tablet (500 mg total) by mouth 2 (two) times daily. 10/05/21   Lamptey, Myrene Galas, MD    Family History Family History  Problem Relation Age of Onset    Hypertension Mother     Social History Social History   Tobacco Use   Smoking status: Some Days    Types: Cigars   Smokeless tobacco: Never   Tobacco comments:    <5 cigars a day  Substance Use Topics   Alcohol use: No   Drug use: Yes    Types: Marijuana    Comment: 1 month ago     Allergies   Patient has no known allergies.   Review of Systems Review of Systems  HENT:  Positive for ear pain (right).      Physical Exam Triage Vital Signs ED Triage Vitals  Enc Vitals Group     BP 01/04/22 1622 101/68     Pulse Rate 01/04/22 1622 71     Resp 01/04/22 1622 18     Temp 01/04/22 1622 98.3 F (36.8 C)     Temp src --      SpO2 01/04/22 1622 98 %     Weight --      Height --      Head Circumference --  Peak Flow --      Pain Score 01/04/22 1621 7     Pain Loc --      Pain Edu? --      Excl. in [place]? --    No data found.  Updated Vital Signs BP 101/68   Pulse 71   Temp 98.3 F (36.8 C)   Resp 18   SpO2 98%   Visual Acuity Right Eye Distance:   Left Eye Distance:   Bilateral Distance:    Right Eye Near:   Left Eye Near:    Bilateral Near:     Physical Exam Constitutional:      Appearance: Normal appearance.  HENT:     Right Ear: Tympanic membrane and ear canal normal.     Left Ear: Tympanic membrane and ear canal normal.     Mouth/Throat:     Mouth: Mucous membranes are moist.     Pharynx: Oropharynx is clear.  Cardiovascular:     Rate and Rhythm: Normal rate and regular rhythm.     Heart sounds: Normal heart sounds.  Pulmonary:     Effort: Pulmonary effort is normal.     Breath sounds: Normal breath sounds and air entry. No wheezing, rhonchi or rales.  Lymphadenopathy:     Cervical: No cervical adenopathy.  Skin:    Comments: Right ear: There is mild enlargement of the postauricular lymph node with mild tenderness on palpation there is no redness associated with the area and it is located just behind the lower aspect of the right ear.   Neurological:     Mental Status: She is alert.      UC Treatments / Results  Labs (all labs ordered are listed, but only abnormal results are displayed) Labs Reviewed - No data to display  EKG   Radiology No results found.  Procedures Procedures (including critical care time)  Medications Ordered in UC Medications - No data to display  Initial Impression / Assessment and Plan / UC Course  I have reviewed the triage vital signs and the nursing notes.  Pertinent labs & imaging results that were available during my care of the patient were reviewed by me and considered in my medical decision making (see chart for details).    Plan: 1.  The right ear pain will be treated with the following: A.  Ibuprofen 600 mg every 8 hours with food to help decrease pain. 2.  The adenopathy will be treated with the following: A.  Ibuprofen 600 mg every 8 hours with food to help decrease swelling. B.  Cool compresses to the area, 10 minutes on 20 minutes off, 3-4 times throughout the day to help reduce swelling. 2.  Advised patient follow-up PCP or return to urgent care if symptoms fail to improve. Final Clinical Impressions(s) / UC Diagnoses   Final diagnoses:  Right ear pain  Adenopathy     Discharge Instructions      Advised take ibuprofen 600 mg every 8 hours with food to help reduce pain and swelling. Advised to put cool compresses to the area, 10 minutes on 20 minutes off, 3-4 times throughout the evening to help reduce the swelling. Advised to follow-up with PCP or return to urgent care if symptoms fail to improve.    ED Prescriptions     Medication Sig Dispense Auth. Provider   ibuprofen (ADVIL) 600 MG tablet Take 1 tablet (600 mg total) by mouth every 6 (six) hours as needed. 30 tablet Nyoka Lint, PA-C  PDMP not reviewed this encounter.   Nyoka Lint, PA-C 01/04/22 1637

## 2022-07-26 ENCOUNTER — Other Ambulatory Visit: Payer: Self-pay

## 2022-07-26 ENCOUNTER — Emergency Department (HOSPITAL_COMMUNITY): Payer: Medicaid Other

## 2022-07-26 ENCOUNTER — Emergency Department (HOSPITAL_COMMUNITY)
Admission: EM | Admit: 2022-07-26 | Discharge: 2022-07-26 | Disposition: A | Discharge status: Home / Self Care | Payer: Medicaid Other | Attending: Emergency Medicine | Admitting: Emergency Medicine

## 2022-07-26 DIAGNOSIS — N939 Abnormal uterine and vaginal bleeding, unspecified: Secondary | ICD-10-CM | POA: Diagnosis present

## 2022-07-26 LAB — COMPREHENSIVE METABOLIC PANEL
ALT: 13 U/L (ref 0–44)
AST: 17 U/L (ref 15–41)
Albumin: 3.1 g/dL — ABNORMAL LOW (ref 3.5–5.0)
Alkaline Phosphatase: 35 U/L — ABNORMAL LOW (ref 38–126)
Anion gap: 7 (ref 5–15)
BUN: 7 mg/dL (ref 6–20)
CO2: 26 mmol/L (ref 22–32)
Calcium: 8.5 mg/dL — ABNORMAL LOW (ref 8.9–10.3)
Chloride: 105 mmol/L (ref 98–111)
Creatinine, Ser: 0.89 mg/dL (ref 0.44–1.00)
GFR, Estimated: >60 mL/min (ref >60)
Glucose, Bld: 96 mg/dL (ref 70–99)
Potassium: 4 mmol/L (ref 3.5–5.1)
Sodium: 138 mmol/L (ref 135–145)
Total Bilirubin: 0.5 mg/dL (ref 0.3–1.2)
Total Protein: 5.5 g/dL — ABNORMAL LOW (ref 6.5–8.1)

## 2022-07-26 LAB — WET PREP, GENITAL
Sperm: NONE SEEN
Trich, Wet Prep: NONE SEEN
WBC, Wet Prep HPF POC: <10 (ref <10)
Yeast Wet Prep HPF POC: NONE SEEN

## 2022-07-26 LAB — I-STAT BETA HCG BLOOD, ED (MC, WL, AP ONLY): I-stat hCG, quantitative: <5 m[IU]/mL (ref <5)

## 2022-07-26 LAB — CBC WITH DIFFERENTIAL/PLATELET
Abs Immature Granulocytes: 0.02 10*3/uL (ref 0.00–0.07)
Basophils Absolute: 0 10*3/uL (ref 0.0–0.1)
Basophils Relative: 0 %
Eosinophils Absolute: 0.1 10*3/uL (ref 0.0–0.5)
Eosinophils Relative: 3 %
HCT: 39.7 % (ref 36.0–46.0)
Hemoglobin: 13.1 g/dL (ref 12.0–15.0)
Immature Granulocytes: 0 %
Lymphocytes Relative: 54 %
Lymphs Abs: 2.5 10*3/uL (ref 0.7–4.0)
MCH: 29.6 pg (ref 26.0–34.0)
MCHC: 33 g/dL (ref 30.0–36.0)
MCV: 89.6 fL (ref 80.0–100.0)
Monocytes Absolute: 0.2 10*3/uL (ref 0.1–1.0)
Monocytes Relative: 3 %
Neutro Abs: 1.9 10*3/uL (ref 1.7–7.7)
Neutrophils Relative %: 40 %
Platelets: 255 10*3/uL (ref 150–400)
RBC: 4.43 MIL/uL (ref 3.87–5.11)
RDW: 12.5 % (ref 11.5–15.5)
WBC: 4.7 10*3/uL (ref 4.0–10.5)
nRBC: 0 % (ref 0.0–0.2)

## 2022-07-26 LAB — URINALYSIS, ROUTINE W REFLEX MICROSCOPIC
Bilirubin Urine: NEGATIVE
Glucose, UA: NEGATIVE mg/dL
Hgb urine dipstick: NEGATIVE
Ketones, ur: NEGATIVE mg/dL
Leukocytes,Ua: NEGATIVE
Nitrite: NEGATIVE
Protein, ur: NEGATIVE mg/dL
Specific Gravity, Urine: 1.026 (ref 1.005–1.030)
pH: 5 (ref 5.0–8.0)

## 2022-07-26 NOTE — ED Triage Notes (Signed)
Patient reports vaginal bleeding for several days with low abdominal cramping , denies hematuria /no fever or chills .

## 2022-07-26 NOTE — ED Provider Notes (Addendum)
[place] EMERGENCY DEPARTMENT AT [facility] Provider Note   CSN: [id number] Arrival date & time: 07/26/22  1916     History  Chief Complaint  Patient presents with   Vaginal Bleeding    Christy Gonzalez is a 27 y.o. female.  Patient here with a complaint of heavy vaginal bleeding just today.  Yesterday has had some spotting the day before no bleeding.  Associated with some lower abdominal pain and some lower back pain this is crampy in nature.  Denies any discharge denies any concern about STD no fever or chills.  Heart rate on arrival was 108 blood pressure 102/78.  Patient states that she maybe felt a little lightheaded today.  She past medical history significant for iron deficiency she used to be on Depo and had vaginal bleedings in the past.  Her main concern was that when she had some bleeding like that she actually was pregnant before.  So she is worried about that.  Patient does use some tobacco products.  No nausea no vomiting.  In triage they ordered pelvic ultrasound.  Ordered chlamydia and GC and wet prep.  Will have her self swab for that.  Pregnancy test is back and is negative so no concerns for pregnancy.  Patient does have an OB/GYN at [facility] that she can follow-up with.       Home Medications Prior to Admission medications   Medication Sig Start Date End Date Taking? Authorizing Provider  ibuprofen (ADVIL) 600 MG tablet Take 1 tablet (600 mg total) by mouth every 6 (six) hours as needed. 01/04/22   Ellsworth Lennox, PA-C  metroNIDAZOLE (FLAGYL) 500 MG tablet Take 1 tablet (500 mg total) by mouth 2 (two) times daily. 10/05/21   Lamptey, Britta Mccreedy, MD      Allergies    Patient has no known allergies.    Review of Systems   Review of Systems  Constitutional:  Negative for chills and fever.  HENT:  Negative for ear pain and sore throat.   Eyes:  Negative for pain and visual disturbance.  Respiratory:  Negative for cough and shortness of  breath.   Cardiovascular:  Negative for chest pain and palpitations.  Gastrointestinal:  Positive for abdominal pain. Negative for vomiting.  Genitourinary:  Positive for vaginal bleeding. Negative for dysuria, hematuria and vaginal discharge.  Musculoskeletal:  Positive for back pain. Negative for arthralgias.  Skin:  Negative for color change and rash.  Neurological:  Positive for light-headedness. Negative for seizures and syncope.  All other systems reviewed and are negative.   Physical Exam Updated Vital Signs BP 102/78 (BP Location: Right Arm)   Pulse (!) 108   Temp 98.9 F (37.2 C)   Resp 18   SpO2 100%  Physical Exam Vitals and nursing note reviewed.  Constitutional:      General: She is not in acute distress.    Appearance: Normal appearance. She is well-developed.  HENT:     Head: Normocephalic and atraumatic.  Eyes:     Extraocular Movements: Extraocular movements intact.     Conjunctiva/sclera: Conjunctivae normal.     Pupils: Pupils are equal, round, and reactive to light.  Cardiovascular:     Rate and Rhythm: Normal rate and regular rhythm.     Heart sounds: No murmur heard. Pulmonary:     Effort: Pulmonary effort is normal. No respiratory distress.     Breath sounds: Normal breath sounds.  Abdominal:     General: There  is no distension.     Palpations: Abdomen is soft.     Tenderness: There is no abdominal tenderness. There is no guarding.  Musculoskeletal:        General: No swelling.     Cervical back: Normal range of motion and neck supple.  Skin:    General: Skin is warm and dry.     Capillary Refill: Capillary refill takes less than 2 seconds.  Neurological:     General: No focal deficit present.     Mental Status: She is alert and oriented to person, place, and time.  Psychiatric:        Mood and Affect: Mood normal.     ED Results / Procedures / Treatments   Labs (all labs ordered are listed, but only abnormal results are displayed) Labs  Reviewed  COMPREHENSIVE METABOLIC PANEL - Abnormal; Notable for the following components:      Result Value   Calcium 8.5 (*)    Total Protein 5.5 (*)    Albumin 3.1 (*)    Alkaline Phosphatase 35 (*)    All other components within normal limits  WET PREP, GENITAL  CBC WITH DIFFERENTIAL/PLATELET  URINALYSIS, ROUTINE W REFLEX MICROSCOPIC  I-STAT BETA HCG BLOOD, ED ([facility], [facility], [facility] ONLY)  GC/CHLAMYDIA PROBE AMP ([place]) NOT AT [facility]    EKG None  Radiology No results found.  Procedures Procedures    Medications Ordered in ED Medications - No data to display  ED Course/ Medical Decision Making/ A&P                             Medical Decision Making Amount and/or Complexity of Data Reviewed Labs: ordered.   Patient's pregnancy test is negative so we will get ultrasound for nonpregnant.  CBC white count 4.7 hemoglobin 13.1 and platelets are 255.  Complete metabolic panel pending.  Urinalysis negative not consistent with urinary tract infection actually does not have significant hemoglobin it.  Pelvic ultrasound pending.  Wet prep and GC and chlamydia swab pending will have patient self swab.  Again patient's main concern is whether she was pregnant.  But she is having a fair amount of crampy discomfort with it.  Ultrasound does not have anything significant we will have patient follow-up with [facility].  Patient's hemoglobin is very well here today.  And patient's vital signs are not consistent with hypotension or significant tachycardia.  Patient nontoxic in no acute distress.  Patient's wet prep had a few some clue cells but clinically she does not have a discharge so not going to treat that with Flagyl.  GC and chlamydia is pending but again patient was concerned about an STD.  Pelvic ultrasound showed no evidence ovarian torsion or other acute process.  Patient feels very reassured pregnancy test being negative.  She will make an appointment to follow-up  with her OB/GYN.  Patient stable for discharge home.  Patient says she will be notified if her GC and chlamydia is positive.    Final Clinical Impression(s) / ED Diagnoses Final diagnoses:  Vaginal bleeding    Rx / DC Orders ED Discharge Orders     None         Vanetta Mulders, MD 07/26/22 1610    Vanetta Mulders, MD 07/26/22 2135

## 2022-07-26 NOTE — Discharge Instructions (Addendum)
As we discussed pregnancy test negative.  Chlamydia and GC cultures are pending.  You will be contacted if these are positive.  Ultrasound showed nothing acute.  Make an appointment to follow-up with OB/GYN in case the bleeding persists.  Since she just started today we will not go ahead and do any treatment.  Also your vaginal wet prep did show some clue cells but without a discharge were not going to treated as a bacterial vaginosis as we discussed.  Return for any new or worse symptoms.

## 2022-07-27 LAB — GC/CHLAMYDIA PROBE AMP (~~LOC~~) NOT AT ARMC
Chlamydia: NEGATIVE
Comment: NEGATIVE
Comment: NORMAL
Neisseria Gonorrhea: NEGATIVE

## 2022-11-05 ENCOUNTER — Emergency Department (HOSPITAL_COMMUNITY)
Admission: EM | Admit: 2022-11-05 | Discharge: 2022-11-05 | Disposition: A | Discharge status: Home / Self Care | Payer: Medicaid Other | Source: Home / Self Care | Attending: Emergency Medicine | Admitting: Emergency Medicine

## 2022-11-05 ENCOUNTER — Emergency Department (HOSPITAL_COMMUNITY): Payer: Medicaid Other

## 2022-11-05 ENCOUNTER — Other Ambulatory Visit: Payer: Self-pay

## 2022-11-05 ENCOUNTER — Encounter (HOSPITAL_COMMUNITY): Payer: Self-pay | Admitting: *Deleted

## 2022-11-05 DIAGNOSIS — R110 Nausea: Secondary | ICD-10-CM | POA: Diagnosis not present

## 2022-11-05 DIAGNOSIS — Z3A01 Less than 8 weeks gestation of pregnancy: Secondary | ICD-10-CM

## 2022-11-05 DIAGNOSIS — R112 Nausea with vomiting, unspecified: Secondary | ICD-10-CM

## 2022-11-05 DIAGNOSIS — O219 Vomiting of pregnancy, unspecified: Secondary | ICD-10-CM | POA: Diagnosis not present

## 2022-11-05 DIAGNOSIS — R1084 Generalized abdominal pain: Secondary | ICD-10-CM | POA: Diagnosis not present

## 2022-11-05 DIAGNOSIS — O26891 Other specified pregnancy related conditions, first trimester: Secondary | ICD-10-CM | POA: Insufficient documentation

## 2022-11-05 DIAGNOSIS — O3680X0 Pregnancy with inconclusive fetal viability, not applicable or unspecified: Secondary | ICD-10-CM | POA: Diagnosis not present

## 2022-11-05 DIAGNOSIS — R8271 Bacteriuria: Secondary | ICD-10-CM

## 2022-11-05 LAB — COMPREHENSIVE METABOLIC PANEL
ALT: 13 U/L (ref 0–44)
AST: 20 U/L (ref 15–41)
Albumin: 4.1 g/dL (ref 3.5–5.0)
Alkaline Phosphatase: 35 U/L — ABNORMAL LOW (ref 38–126)
Anion gap: 12 (ref 5–15)
BUN: 7 mg/dL (ref 6–20)
CO2: 23 mmol/L (ref 22–32)
Calcium: 9.2 mg/dL (ref 8.9–10.3)
Chloride: 103 mmol/L (ref 98–111)
Creatinine, Ser: 0.89 mg/dL (ref 0.44–1.00)
GFR, Estimated: >60 mL/min (ref >60)
Glucose, Bld: 108 mg/dL — ABNORMAL HIGH (ref 70–99)
Potassium: 3.5 mmol/L (ref 3.5–5.1)
Sodium: 138 mmol/L (ref 135–145)
Total Bilirubin: 0.4 mg/dL (ref 0.3–1.2)
Total Protein: 7 g/dL (ref 6.5–8.1)

## 2022-11-05 LAB — URINALYSIS, ROUTINE W REFLEX MICROSCOPIC
Bilirubin Urine: NEGATIVE
Glucose, UA: NEGATIVE mg/dL
Hgb urine dipstick: NEGATIVE
Ketones, ur: NEGATIVE mg/dL
Leukocytes,Ua: NEGATIVE
Nitrite: NEGATIVE
Protein, ur: 30 mg/dL — AB
Specific Gravity, Urine: 1.025 (ref 1.005–1.030)
pH: 7 (ref 5.0–8.0)

## 2022-11-05 LAB — CBC
HCT: 37.7 % (ref 36.0–46.0)
Hemoglobin: 12.5 g/dL (ref 12.0–15.0)
MCH: 29.3 pg (ref 26.0–34.0)
MCHC: 33.2 g/dL (ref 30.0–36.0)
MCV: 88.3 fL (ref 80.0–100.0)
Platelets: 176 10*3/uL (ref 150–400)
RBC: 4.27 MIL/uL (ref 3.87–5.11)
RDW: 12 % (ref 11.5–15.5)
WBC: 5 10*3/uL (ref 4.0–10.5)
nRBC: 0 % (ref 0.0–0.2)

## 2022-11-05 LAB — HCG, QUANTITATIVE, PREGNANCY: hCG, Beta Chain, Quant, S: 247 m[IU]/mL — ABNORMAL HIGH (ref <5)

## 2022-11-05 LAB — HCG, SERUM, QUALITATIVE: Preg, Serum: POSITIVE — AB

## 2022-11-05 LAB — LIPASE, BLOOD: Lipase: 24 U/L (ref 11–51)

## 2022-11-05 MED ORDER — ONDANSETRON HCL 4 MG/2ML IJ SOLN
4.0000 mg | Freq: Once | INTRAMUSCULAR | Status: AC
  Administered 2022-11-05: 4 mg via INTRAVENOUS
  Filled 2022-11-05: qty 2

## 2022-11-05 MED ORDER — NITROFURANTOIN MONOHYD MACRO 100 MG PO CAPS: 100.0000 mg | ORAL_CAPSULE | Freq: Two times a day (BID) | ORAL | 0 refills | Status: DC

## 2022-11-05 MED ORDER — CEPHALEXIN 500 MG PO CAPS: 500.0000 mg | ORAL_CAPSULE | Freq: Four times a day (QID) | ORAL | 0 refills | Status: DC

## 2022-11-05 MED ORDER — DOXYLAMINE-PYRIDOXINE 10-10 MG PO TBEC: 1.0000 | DELAYED_RELEASE_TABLET | Freq: Two times a day (BID) | ORAL | 0 refills | Status: DC

## 2022-11-05 MED ORDER — SODIUM CHLORIDE 0.9 % IV BOLUS
500.0000 mL | Freq: Once | INTRAVENOUS | Status: AC
  Administered 2022-11-05: 500 mL via INTRAVENOUS

## 2022-11-05 MED ORDER — NITROFURANTOIN MONOHYD MACRO 100 MG PO CAPS: 100.0000 mg | ORAL_CAPSULE | Freq: Once | ORAL | Status: DC

## 2022-11-05 MED ORDER — ONDANSETRON 8 MG PO TBDP: 8.0000 mg | ORAL_TABLET | Freq: Three times a day (TID) | ORAL | 0 refills | Status: DC | PRN

## 2022-11-05 MED ORDER — CEPHALEXIN 250 MG PO CAPS
500.0000 mg | ORAL_CAPSULE | Freq: Once | ORAL | Status: AC
  Administered 2022-11-05: 500 mg via ORAL
  Filled 2022-11-05: qty 2

## 2022-11-05 NOTE — ED Notes (Signed)
RN discussed d/c instructions with pt and spouse. Pt and spouse verbalized understanding and no additional questions at this time.

## 2022-11-05 NOTE — Discharge Instructions (Addendum)
You were noted to have early pregnany and possible uti. Take the antibiotics for UTI.  You are early in pregnancy - we cannot confirm viable pregnancy on ultrasound. Therefore, you will need to get a repeat blood test in 48 hours (Monday morning).  If you cannot get an appointment with your OB/GYN then come to [facility]U for repeat blood test.  If you start having worsening abdominal pain, bleeding then please return to the ER.

## 2022-11-05 NOTE — ED Triage Notes (Signed)
The pt started with abd pain  nausea and vomiting  for the past 4 hours  no diarrhea  lmp  aug10

## 2022-11-05 NOTE — ED Provider Notes (Signed)
[place] EMERGENCY DEPARTMENT AT [facility] Provider Note   CSN: [id number] Arrival date & time: 11/05/22  8657     History  Chief Complaint  Patient presents with   Abdominal Pain    Christy Gonzalez is a 27 y.o. female.  The history is provided by the patient.  Abdominal Pain Pain location:  Generalized Pain quality: cramping   Pain radiates to:  Does not radiate Pain severity:  Moderate Onset quality:  Sudden Timing:  Constant Progression:  Unchanged Chronicity:  New Context: not alcohol use   Patient G4 P3 believe she may have food poisoning following eating seafood at [facility].  Crampy pain with nausea and emesis.      Home Medications Prior to Admission medications   Medication Sig Start Date End Date Taking? Authorizing Provider  ibuprofen (ADVIL) 600 MG tablet Take 1 tablet (600 mg total) by mouth every 6 (six) hours as needed. 01/04/22   Ellsworth Lennox, PA-C  metroNIDAZOLE (FLAGYL) 500 MG tablet Take 1 tablet (500 mg total) by mouth 2 (two) times daily. 10/05/21   Lamptey, Britta Mccreedy, MD      Allergies    Patient has no known allergies.    Review of Systems   Review of Systems  Gastrointestinal:  Positive for abdominal pain.    Physical Exam Updated Vital Signs BP 102/80   Pulse 62   Temp 97.7 F (36.5 C) (Oral)   Resp 17   Ht 5\' 4"  (1.626 m)   Wt 61.2 kg   LMP 10/15/2022   SpO2 100%   BMI 23.16 kg/m  Physical Exam Vitals and nursing note reviewed.  Constitutional:      General: She is not in acute distress.    Appearance: Normal appearance. She is well-developed.  HENT:     Head: Normocephalic and atraumatic.     Nose: Nose normal.  Eyes:     Pupils: Pupils are equal, round, and reactive to light.  Cardiovascular:     Rate and Rhythm: Normal rate and regular rhythm.     Pulses: Normal pulses.     Heart sounds: Normal heart sounds.  Pulmonary:     Effort: Pulmonary effort is normal. No respiratory distress.     Breath sounds:  Normal breath sounds.  Abdominal:     General: Bowel sounds are normal. There is no distension.     Palpations: Abdomen is soft.     Tenderness: There is no abdominal tenderness. There is no guarding or rebound.  Musculoskeletal:        General: Normal range of motion.     Cervical back: Neck supple.  Skin:    General: Skin is warm and dry.     Capillary Refill: Capillary refill takes less than 2 seconds.     Findings: No erythema or rash.  Neurological:     General: No focal deficit present.     Mental Status: She is alert and oriented to person, place, and time.     Deep Tendon Reflexes: Reflexes normal.  Psychiatric:        Mood and Affect: Mood normal.     ED Results / Procedures / Treatments   Labs (all labs ordered are listed, but only abnormal results are displayed) Results for orders placed or performed during the hospital encounter of 11/05/22  Lipase, blood  Result Value Ref Range   Lipase 24 11 - 51 U/L  Comprehensive metabolic panel  Result Value Ref Range   Sodium  138 135 - 145 mmol/L   Potassium 3.5 3.5 - 5.1 mmol/L   Chloride 103 98 - 111 mmol/L   CO2 23 22 - 32 mmol/L   Glucose, Bld 108 (H) 70 - 99 mg/dL   BUN 7 6 - 20 mg/dL   Creatinine, Ser 1.61 0.44 - 1.00 mg/dL   Calcium 9.2 8.9 - 09.6 mg/dL   Total Protein 7.0 6.5 - 8.1 g/dL   Albumin 4.1 3.5 - 5.0 g/dL   AST 20 15 - 41 U/L   ALT 13 0 - 44 U/L   Alkaline Phosphatase 35 (L) 38 - 126 U/L   Total Bilirubin 0.4 0.3 - 1.2 mg/dL   GFR, Estimated >04 >54 mL/min   Anion gap 12 5 - 15  CBC  Result Value Ref Range   WBC 5.0 4.0 - 10.5 K/uL   RBC 4.27 3.87 - 5.11 MIL/uL   Hemoglobin 12.5 12.0 - 15.0 g/dL   HCT 09.8 11.9 - 14.7 %   MCV 88.3 80.0 - 100.0 fL   MCH 29.3 26.0 - 34.0 pg   MCHC 33.2 30.0 - 36.0 g/dL   RDW 82.9 56.2 - 13.0 %   Platelets 176 150 - 400 K/uL   nRBC 0.0 0.0 - 0.2 %  Urinalysis, Routine w reflex microscopic -Urine, Clean Catch  Result Value Ref Range   Color, Urine YELLOW  YELLOW   APPearance HAZY (A) CLEAR   Specific Gravity, Urine 1.025 1.005 - 1.030   pH 7.0 5.0 - 8.0   Glucose, UA NEGATIVE NEGATIVE mg/dL   Hgb urine dipstick NEGATIVE NEGATIVE   Bilirubin Urine NEGATIVE NEGATIVE   Ketones, ur NEGATIVE NEGATIVE mg/dL   Protein, ur 30 (A) NEGATIVE mg/dL   Nitrite NEGATIVE NEGATIVE   Leukocytes,Ua NEGATIVE NEGATIVE   RBC / HPF 0-5 0 - 5 RBC/hpf   WBC, UA 0-5 0 - 5 WBC/hpf   Bacteria, UA FEW (A) NONE SEEN   Squamous Epithelial / HPF 0-5 0 - 5 /HPF   Mucus PRESENT   hCG, serum, qualitative  Result Value Ref Range   Preg, Serum POSITIVE (A) NEGATIVE   US OB Comp < 14 Wks  Result Date: 11/05/2022 CLINICAL DATA:  27 year old female with pain in the 1st trimester of pregnancy. Positive beta HCG. EXAM: OBSTETRIC <14 WK ULTRASOUND TECHNIQUE: Transabdominal ultrasound was performed for evaluation of the gestation as well as the maternal uterus and adnexal regions. COMPARISON:  Pelvis Doppler ultrasound 07/26/2022. FINDINGS: Intrauterine gestational sac: None. Maternal uterus/adnexae: Trace fluid in the endometrial canal on series 1, image 19. Bland appearing endometrial thickening otherwise up to 13 mm (image 22). Left ovary measures 3.0 x 3.0 by 3.8 cm. There is a minimally septated otherwise simple 15 mm left ovarian cyst with no vascular elements (image 30). Right ovary is 3.6 x 1.9 by 2.4 cm and normal. Minimal pelvis free fluid. IMPRESSION: 1. No IUP identified, and ovaries are within normal limits. Differential considerations include early IUP, failed IUP, and occult ectopic pregnancy. 2. Recommend serial quantitative beta HCG and repeat ultrasound as necessary. Electronically Signed   By: Odessa Fleming M.D.   On: 11/05/2022 06:25     Radiology US OB Comp < 14 Wks  Result Date: 11/05/2022 CLINICAL DATA:  27 year old female with pain in the 1st trimester of pregnancy. Positive beta HCG. EXAM: OBSTETRIC <14 WK ULTRASOUND TECHNIQUE: Transabdominal ultrasound was  performed for evaluation of the gestation as well as the maternal uterus and adnexal regions. COMPARISON:  Pelvis  Doppler ultrasound 07/26/2022. FINDINGS: Intrauterine gestational sac: None. Maternal uterus/adnexae: Trace fluid in the endometrial canal on series 1, image 19. Bland appearing endometrial thickening otherwise up to 13 mm (image 22). Left ovary measures 3.0 x 3.0 by 3.8 cm. There is a minimally septated otherwise simple 15 mm left ovarian cyst with no vascular elements (image 30). Right ovary is 3.6 x 1.9 by 2.4 cm and normal. Minimal pelvis free fluid. IMPRESSION: 1. No IUP identified, and ovaries are within normal limits. Differential considerations include early IUP, failed IUP, and occult ectopic pregnancy. 2. Recommend serial quantitative beta HCG and repeat ultrasound as necessary. Electronically Signed   By: Odessa Fleming M.D.   On: 11/05/2022 06:25    Procedures Procedures    Medications Ordered in ED Medications  ondansetron (ZOFRAN) injection 4 mg (4 mg Intravenous Given 11/05/22 0435)  sodium chloride 0.9 % bolus 500 mL (0 mLs Intravenous Stopped 11/05/22 0533)    ED Course/ Medical Decision Making/ A&P                                 Medical Decision Making Amount and/or Complexity of Data Reviewed Labs: ordered.    Details: Pregnancy is positive/ Lipase is 24 and normal.  Sodium normal 138, normal potassium 3.5, normal creatinine.  Urine with a few bacteria.  White count 5, normal hemoglobin 12.5, normal platelet count  Radiology: ordered and independent interpretation performed.    Details: NO IUP   Risk Prescription drug management. Risk Details: Awaiting Beta Hcg plan based on beta.      Final Clinical Impression(s) / ED Diagnoses Final diagnoses:  Pregnancy, unspecified gestational age  Asymptomatic bacteriuria  Nausea and vomiting, unspecified vomiting type   Patient signed out to Dr. Rhunette Croft pending beta HCG Rx / DC Orders ED Discharge Orders     None          Dyllin Gulley, MD 11/05/22 [phone]

## 2022-11-05 NOTE — ED Provider Notes (Signed)
  Physical Exam  BP 112/77   Pulse 60   Temp 98.4 F (36.9 C) (Oral)   Resp 18   Ht 5\' 4"  (1.626 m)   Wt 61.2 kg   LMP 10/15/2022   SpO2 100%   BMI 23.16 kg/m   Physical Exam  Procedures  Procedures  ED Course / MDM    Medical Decision Making Amount and/or Complexity of Data Reviewed Labs: ordered. Radiology: ordered.  Risk Prescription drug management.   Patient had manage chief complaint of lower quadrant abdominal pain.  I assumed care of this patient from Dr. Daun Peacock.  Patient's workup is overall reassuring.  She does have positive urine pregnancy test.  Ultrasound has been completed, no IUP seen.  I was advised to follow-up on the quant hCG.  Patient's hCG quant is in the 200s.  Upon reassessment, patient states that she is having lower quadrant abdominal discomfort that is intermittent.  She denies any bleeding.  She denies any severe back pain, dizziness, near fainting or fainting.  No history of ectopic pregnancy.  I informed patient that her quant hCG is also positive, but it is extremely low and most likely she is very early in pregnancy.  Ultrasound is not able to confirm an IUP therefore patient will need repeat quant in 48 hours.    Patient follows up with [facility].  I requested that she call them today to see if they can get her in for 48-hour quant.  If the OB/GYN is unsuccessful, then patient can come to [facility]U.  Additionally, patient will need to return to the emergency room immediately if he starts having severe pain, bleeding, near fainting or fainting.  Patient and her partner understand the results and the need for 48-hour quant recheck and the need for return to the ER if her symptoms are worsening.  She will get Keflex for questionable UTI in early pregnancy.     Derwood Kaplan, MD 11/05/22 [phone]

## 2022-11-23 LAB — OB RESULTS CONSOLE RUBELLA ANTIBODY, IGM: Rubella: IMMUNE

## 2022-11-23 LAB — OB RESULTS CONSOLE HIV ANTIBODY (ROUTINE TESTING): HIV: NONREACTIVE

## 2022-11-23 LAB — OB RESULTS CONSOLE GC/CHLAMYDIA
Chlamydia: NEGATIVE
Neisseria Gonorrhea: NEGATIVE

## 2022-11-23 LAB — OB RESULTS CONSOLE HEPATITIS B SURFACE ANTIGEN: Hepatitis B Surface Ag: NEGATIVE

## 2022-11-23 LAB — OB RESULTS CONSOLE RPR: RPR: NONREACTIVE

## 2022-11-28 DIAGNOSIS — E559 Vitamin D deficiency, unspecified: Secondary | ICD-10-CM | POA: Insufficient documentation

## 2023-02-21 ENCOUNTER — Other Ambulatory Visit: Payer: Self-pay

## 2023-02-21 ENCOUNTER — Inpatient Hospital Stay (HOSPITAL_COMMUNITY)
Admission: AD | Admit: 2023-02-21 | Discharge: 2023-02-21 | Disposition: A | Discharge status: Home / Self Care | Payer: Medicaid Other | Attending: Obstetrics and Gynecology | Admitting: Obstetrics and Gynecology

## 2023-02-21 ENCOUNTER — Encounter (HOSPITAL_COMMUNITY): Payer: Self-pay | Admitting: *Deleted

## 2023-02-21 DIAGNOSIS — R109 Unspecified abdominal pain: Secondary | ICD-10-CM | POA: Insufficient documentation

## 2023-02-21 DIAGNOSIS — O219 Vomiting of pregnancy, unspecified: Secondary | ICD-10-CM | POA: Diagnosis not present

## 2023-02-21 DIAGNOSIS — O26892 Other specified pregnancy related conditions, second trimester: Secondary | ICD-10-CM | POA: Diagnosis present

## 2023-02-21 DIAGNOSIS — Z3A19 19 weeks gestation of pregnancy: Secondary | ICD-10-CM | POA: Diagnosis not present

## 2023-02-21 LAB — URINALYSIS, ROUTINE W REFLEX MICROSCOPIC
Bacteria, UA: NONE SEEN
Bilirubin Urine: NEGATIVE
Glucose, UA: NEGATIVE mg/dL
Hgb urine dipstick: NEGATIVE
Ketones, ur: NEGATIVE mg/dL
Leukocytes,Ua: NEGATIVE
Nitrite: NEGATIVE
Protein, ur: 30 mg/dL — AB
Specific Gravity, Urine: 1.023 (ref 1.005–1.030)
pH: 6 (ref 5.0–8.0)

## 2023-02-21 LAB — COMPREHENSIVE METABOLIC PANEL
ALT: 11 U/L (ref 0–44)
AST: 16 U/L (ref 15–41)
Albumin: 3.1 g/dL — ABNORMAL LOW (ref 3.5–5.0)
Alkaline Phosphatase: 34 U/L — ABNORMAL LOW (ref 38–126)
Anion gap: 7 (ref 5–15)
BUN: <5 mg/dL — ABNORMAL LOW (ref 6–20)
CO2: 22 mmol/L (ref 22–32)
Calcium: 8.6 mg/dL — ABNORMAL LOW (ref 8.9–10.3)
Chloride: 105 mmol/L (ref 98–111)
Creatinine, Ser: 0.53 mg/dL (ref 0.44–1.00)
GFR, Estimated: >60 mL/min (ref >60)
Glucose, Bld: 94 mg/dL (ref 70–99)
Potassium: 3 mmol/L — ABNORMAL LOW (ref 3.5–5.1)
Sodium: 134 mmol/L — ABNORMAL LOW (ref 135–145)
Total Bilirubin: 0.4 mg/dL (ref <1.2)
Total Protein: 6.1 g/dL — ABNORMAL LOW (ref 6.5–8.1)

## 2023-02-21 LAB — CBC
HCT: 29.9 % — ABNORMAL LOW (ref 36.0–46.0)
Hemoglobin: 10.3 g/dL — ABNORMAL LOW (ref 12.0–15.0)
MCH: 30.4 pg (ref 26.0–34.0)
MCHC: 34.4 g/dL (ref 30.0–36.0)
MCV: 88.2 fL (ref 80.0–100.0)
Platelets: 202 10*3/uL (ref 150–400)
RBC: 3.39 MIL/uL — ABNORMAL LOW (ref 3.87–5.11)
RDW: 12.4 % (ref 11.5–15.5)
WBC: 6.2 10*3/uL (ref 4.0–10.5)
nRBC: 0 % (ref 0.0–0.2)

## 2023-02-21 MED ORDER — FAMOTIDINE 20 MG PO TABS: 20.0000 mg | ORAL_TABLET | Freq: Two times a day (BID) | ORAL | Status: DC

## 2023-02-21 MED ORDER — CYCLOBENZAPRINE HCL 5 MG PO TABS
10.0000 mg | ORAL_TABLET | Freq: Once | ORAL | Status: AC
  Administered 2023-02-21: 10 mg via ORAL
  Filled 2023-02-21: qty 2

## 2023-02-21 MED ORDER — ACETAMINOPHEN 500 MG PO TABS
1000.0000 mg | ORAL_TABLET | Freq: Once | ORAL | Status: AC
  Administered 2023-02-21: 1000 mg via ORAL
  Filled 2023-02-21: qty 2

## 2023-02-21 MED ORDER — SCOPOLAMINE 1 MG/3DAYS TD PT72: 1.0000 | MEDICATED_PATCH | TRANSDERMAL | 12 refills | Status: DC

## 2023-02-21 MED ORDER — CYCLOBENZAPRINE HCL 10 MG PO TABS: 10.0000 mg | ORAL_TABLET | Freq: Two times a day (BID) | ORAL | 0 refills | Status: DC | PRN

## 2023-02-21 MED ORDER — ONDANSETRON 8 MG PO TBDP: 8.0000 mg | ORAL_TABLET | Freq: Three times a day (TID) | ORAL | 0 refills | Status: DC | PRN

## 2023-02-21 MED ORDER — ONDANSETRON 4 MG PO TBDP
8.0000 mg | ORAL_TABLET | Freq: Once | ORAL | Status: AC
  Administered 2023-02-21: 8 mg via ORAL
  Filled 2023-02-21: qty 2

## 2023-02-21 MED ORDER — FAMOTIDINE 20 MG PO TABS: 20.0000 mg | ORAL_TABLET | Freq: Two times a day (BID) | ORAL | 12 refills | Status: DC

## 2023-02-21 MED ORDER — SCOPOLAMINE 1 MG/3DAYS TD PT72
1.0000 | MEDICATED_PATCH | Freq: Once | TRANSDERMAL | Status: DC
  Administered 2023-02-21: 1.5 mg via TRANSDERMAL
  Filled 2023-02-21: qty 1

## 2023-02-21 NOTE — MAU Note (Signed)
Christy Gonzalez is a 27 y.o. at Unknown here in [facility]U reporting: she's been having abdominal pain since yesterday.  Denies VB or LOF.  Reports has been feeling FM LMP: NA Onset of complaint: yesterday Pain score: 8 Vitals:   02/21/23 0933  BP: 106/60  Pulse: 71  Resp: 18  Temp: 97.6 F (36.4 C)  SpO2: 100%     FHT:152 bpm Lab orders placed from triage:   UA

## 2023-02-21 NOTE — MAU Provider Note (Signed)
History     CSN: [id number]  Arrival date and time: 02/21/23 1308   Event Date/Time   First Provider Initiated Contact with Patient 02/21/23 1010      Chief Complaint  Patient presents with   Abdominal Pain   HPI Christy Gonzalez at [redacted]w[redacted]d presenting with right lateral pain.  Started yesterday morning and called an 8 out of 10.  He did not try anything.  She denies any aggravating or alleviating pain.  She does have nausea and vomiting patient has been vomiting since early hours of hours ports that she has vomited about 3-4 times today.  Denies fevers and chills.  Reports fetal movement.  Denies bleeding or loss of fluid.   OB History     Gravida  4   Para  3   Term  3   Preterm      AB      Living  3      SAB      IAB      Ectopic      Multiple  0   Live Births  3           Past Medical History:  Diagnosis Date   Anemia    Iron deficiency     Past Surgical History:  Procedure Laterality Date   NO PAST SURGERIES      Family History  Problem Relation Age of Onset   Hypertension Mother     Social History   Tobacco Use   Smoking status: Some Days    Types: Cigars   Smokeless tobacco: Never   Tobacco comments:    <5 cigars a day  Substance Use Topics   Alcohol use: No   Drug use: Yes    Types: Marijuana    Comment: 1 month ago    Allergies: No Known Allergies  No medications prior to admission.    Review of Systems  Constitutional:  Negative for chills and fever.  HENT:  Negative for congestion and sore throat.   Eyes:  Negative for pain and visual disturbance.  Respiratory:  Negative for cough, chest tightness and shortness of breath.   Cardiovascular:  Negative for chest pain.  Gastrointestinal:  Negative for abdominal pain, diarrhea, nausea and vomiting.  Endocrine: Negative for cold intolerance and heat intolerance.  Genitourinary:  Negative for dysuria and flank pain.  Musculoskeletal:  Negative for back pain.  Skin:   Negative for rash.  Allergic/Immunologic: Negative for food allergies.  Neurological:  Negative for dizziness and light-headedness.  Psychiatric/Behavioral:  Negative for agitation.    Physical Exam   Blood pressure (!) 98/57, pulse 69, temperature 98.4 F (36.9 C), temperature source Oral, resp. rate 18, height 5\' 4"  (1.626 m), weight 64.1 kg, last menstrual period 10/15/2022, SpO2 98%.  Physical Exam Vitals and nursing note reviewed.  Constitutional:      General: She is not in acute distress.    Appearance: She is well-developed.     Comments: Pregnant female  HENT:     Head: Normocephalic and atraumatic.  Eyes:     General: No scleral icterus.    Conjunctiva/sclera: Conjunctivae normal.  Cardiovascular:     Rate and Rhythm: Normal rate.  Pulmonary:     Effort: Pulmonary effort is normal.  Chest:     Chest wall: No tenderness.  Abdominal:     Palpations: Abdomen is soft.     Tenderness: There is abdominal tenderness (to the right of uterus, abdominally). There is no  guarding or rebound.     Comments: Gravid  Genitourinary:    Vagina: Normal.  Musculoskeletal:        General: Normal range of motion.     Cervical back: Normal range of motion and neck supple.  Skin:    General: Skin is warm and dry.     Findings: No rash.  Neurological:     Mental Status: She is alert and oriented to person, place, and time.     MAU Course  Pelvic US  Date/Time: 02/21/2023 1:11 PM  Performed by: Federico Flake, MD Authorized by: Federico Flake, MD   Procedure details:    Indications: pregnant with abdominal pain     Assess:  Intrauterine pregnancy   Images: archived    Uterine findings:    Fetal heart rate: identified     Estimated gestational age: 6 weeks Comments:     Vigorous fetal movement. Fetus is in the RLQ     MDM: moderate  This patient presents to the ED for concern of   Chief Complaint  Patient presents with   Abdominal Pain     These  complains involves an extensive number of treatment options, and is a complaint that carries with it a high risk of complications and morbidity.  The differential diagnosis for  1. Right side pain INCLUDES most likely MSK in nature intra-abdominal process.  Bedside ultrasound was reassuring.  Fetal movement.  Of note predisposing in the right lower quadrant which may be contributing to her right-sided pain.    Co morbidities that complicate the patient evaluation: Patient Active Problem List   Diagnosis Date Noted   Normal postpartum course 07/12/2018   Normal labor 07/10/2018   SVD (spontaneous vaginal delivery) 07/10/2018   Chlamydia infection affecting pregnancy in first trimester 11/20/2017   Spontaneous vaginal delivery 05/21/2015   Rubella non-immune status, antepartum 05/21/2015   Anemia of pregnancy 05/21/2015     External records from outside source obtained and reviewed including Prenatal care records  Lab Tests:  Results for orders placed or performed during the hospital encounter of 02/21/23 (from the past 24 hours)  Urinalysis, Routine w reflex microscopic -Urine, Clean Catch     Status: Abnormal   Collection Time: 02/21/23 10:18 AM  Result Value Ref Range   Color, Urine YELLOW YELLOW   APPearance CLEAR CLEAR   Specific Gravity, Urine 1.023 1.005 - 1.030   pH 6.0 5.0 - 8.0   Glucose, UA NEGATIVE NEGATIVE mg/dL   Hgb urine dipstick NEGATIVE NEGATIVE   Bilirubin Urine NEGATIVE NEGATIVE   Ketones, ur NEGATIVE NEGATIVE mg/dL   Protein, ur 30 (A) NEGATIVE mg/dL   Nitrite NEGATIVE NEGATIVE   Leukocytes,Ua NEGATIVE NEGATIVE   RBC / HPF 0-5 0 - 5 RBC/hpf   WBC, UA 0-5 0 - 5 WBC/hpf   Bacteria, UA NONE SEEN NONE SEEN   Squamous Epithelial / HPF 0-5 0 - 5 /HPF   Mucus PRESENT   CBC     Status: Abnormal   Collection Time: 02/21/23 10:28 AM  Result Value Ref Range   WBC 6.2 4.0 - 10.5 K/uL   RBC 3.39 (L) 3.87 - 5.11 MIL/uL   Hemoglobin 10.3 (L) 12.0 - 15.0 g/dL   HCT  59.5 (L) 63.8 - 46.0 %   MCV 88.2 80.0 - 100.0 fL   MCH 30.4 26.0 - 34.0 pg   MCHC 34.4 30.0 - 36.0 g/dL   RDW 75.6 43.3 - 29.5 %   Platelets  202 150 - 400 K/uL   nRBC 0.0 0.0 - 0.2 %  Comprehensive metabolic panel     Status: Abnormal   Collection Time: 02/21/23 10:28 AM  Result Value Ref Range   Sodium 134 (L) 135 - 145 mmol/L   Potassium 3.0 (L) 3.5 - 5.1 mmol/L   Chloride 105 98 - 111 mmol/L   CO2 22 22 - 32 mmol/L   Glucose, Bld 94 70 - 99 mg/dL   BUN <5 (L) 6 - 20 mg/dL   Creatinine, Ser 6.29 0.44 - 1.00 mg/dL   Calcium 8.6 (L) 8.9 - 10.3 mg/dL   Total Protein 6.1 (L) 6.5 - 8.1 g/dL   Albumin 3.1 (L) 3.5 - 5.0 g/dL   AST 16 15 - 41 U/L   ALT 11 0 - 44 U/L   Alkaline Phosphatase 34 (L) 38 - 126 U/L   Total Bilirubin 0.4 <1.2 mg/dL   GFR, Estimated >52 >84 mL/min   Anion gap 7 5 - 15    Medicines ordered and prescription drug management:  Medications:  Meds ordered this encounter  Medications   acetaminophen (TYLENOL) tablet 1,000 mg   DISCONTD: scopolamine (TRANSDERM-SCOP) 1 MG/3DAYS 1.5 mg   cyclobenzaprine (FLEXERIL) tablet 10 mg   ondansetron (ZOFRAN-ODT) disintegrating tablet 8 mg   scopolamine (TRANSDERM-SCOP) 1 MG/3DAYS    Sig: Place 1 patch (1.5 mg total) onto the skin every 3 (three) days.    Dispense:  10 patch    Refill:  12   ondansetron (ZOFRAN-ODT) 8 MG disintegrating tablet    Sig: Take 1 tablet (8 mg total) by mouth every 8 (eight) hours as needed for nausea.    Dispense:  20 tablet    Refill:  0   cyclobenzaprine (FLEXERIL) 10 MG tablet    Sig: Take 1 tablet (10 mg total) by mouth 2 (two) times daily as needed for muscle spasms.    Dispense:  10 tablet    Refill:  0   DISCONTD: famotidine (PEPCID) tablet 20 mg   famotidine (PEPCID) 20 MG tablet    Sig: Take 1 tablet (20 mg total) by mouth 2 (two) times daily.    Dispense:  60 tablet    Refill:  12      Reevaluation of the patient after these medicines showed that the patient improved I  have reviewed the patients home medicines and have made adjustments as needed    MAU Course: 13:00 Reassessment-- felt better after tylenol but then vomited 3 times. Performed BSUS. Fetal head is in the RLQ and good vigos movement was seen.   3:10 PM   After the interventions noted above, I reevaluated the patient and found that they have :improved  Dispostion: discharged   Assessment and Plan   1. Nausea and vomiting during pregnancy prior to [redacted] weeks gestation   2. [redacted] weeks gestation of pregnancy    - Discharged home  - RX for scolp patch, famotidine for n/v - Flexeril for MSK pain  Federico Flake 02/21/2023, 8:39 PM  Allergies as of 02/21/2023   No Known Allergies      Medication List     STOP taking these medications    cephALEXin 500 MG capsule Commonly known as: KEFLEX   ibuprofen 600 MG tablet Commonly known as: ADVIL   metroNIDAZOLE 500 MG tablet Commonly known as: FLAGYL   nitrofurantoin (macrocrystal-monohydrate) 100 MG capsule Commonly known as: Macrobid       TAKE these medications  cyclobenzaprine 10 MG tablet Commonly known as: FLEXERIL Take 1 tablet (10 mg total) by mouth 2 (two) times daily as needed for muscle spasms.   Doxylamine-Pyridoxine 10-10 MG Tbec Take 1 tablet by mouth in the morning and at bedtime.   famotidine 20 MG tablet Commonly known as: PEPCID Take 1 tablet (20 mg total) by mouth 2 (two) times daily.   ondansetron 8 MG disintegrating tablet Commonly known as: ZOFRAN-ODT Take 1 tablet (8 mg total) by mouth every 8 (eight) hours as needed for nausea.   promethazine 25 MG tablet Commonly known as: PHENERGAN Take 25 mg by mouth every 6 (six) hours as needed for nausea or vomiting.   scopolamine 1 MG/3DAYS Commonly known as: TRANSDERM-SCOP Place 1 patch (1.5 mg total) onto the skin every 3 (three) days.

## 2023-02-21 NOTE — Discharge Instructions (Signed)
You were seen for abdominal soreness. This is likely due to muscle strain and improved with flexeril and tylenol. We also treated your nausea and vomiting. You were prescribed additional medications to help with your nausea and vomiting.   Please return to the MAU for continued nausea and vomiting, inabiltily to keep down medications. Or other concerns.

## 2023-02-27 ENCOUNTER — Inpatient Hospital Stay (HOSPITAL_COMMUNITY): Payer: Medicaid Other

## 2023-02-27 ENCOUNTER — Encounter (HOSPITAL_COMMUNITY): Payer: Self-pay | Admitting: Obstetrics and Gynecology

## 2023-02-27 ENCOUNTER — Inpatient Hospital Stay (HOSPITAL_COMMUNITY)
Admission: AD | Admit: 2023-02-27 | Discharge: 2023-02-27 | Disposition: A | Discharge status: Home / Self Care | Payer: Medicaid Other | Attending: Obstetrics and Gynecology | Admitting: Obstetrics and Gynecology

## 2023-02-27 DIAGNOSIS — R102 Pelvic and perineal pain: Secondary | ICD-10-CM

## 2023-02-27 DIAGNOSIS — O218 Other vomiting complicating pregnancy: Secondary | ICD-10-CM | POA: Diagnosis not present

## 2023-02-27 DIAGNOSIS — O26892 Other specified pregnancy related conditions, second trimester: Secondary | ICD-10-CM | POA: Diagnosis not present

## 2023-02-27 DIAGNOSIS — N949 Unspecified condition associated with female genital organs and menstrual cycle: Secondary | ICD-10-CM | POA: Diagnosis not present

## 2023-02-27 DIAGNOSIS — D649 Anemia, unspecified: Secondary | ICD-10-CM | POA: Insufficient documentation

## 2023-02-27 DIAGNOSIS — M545 Low back pain, unspecified: Secondary | ICD-10-CM | POA: Diagnosis present

## 2023-02-27 DIAGNOSIS — R1031 Right lower quadrant pain: Secondary | ICD-10-CM | POA: Insufficient documentation

## 2023-02-27 DIAGNOSIS — O99012 Anemia complicating pregnancy, second trimester: Secondary | ICD-10-CM | POA: Diagnosis not present

## 2023-02-27 DIAGNOSIS — M549 Dorsalgia, unspecified: Secondary | ICD-10-CM | POA: Diagnosis not present

## 2023-02-27 DIAGNOSIS — O99891 Other specified diseases and conditions complicating pregnancy: Secondary | ICD-10-CM | POA: Insufficient documentation

## 2023-02-27 DIAGNOSIS — Z3A20 20 weeks gestation of pregnancy: Secondary | ICD-10-CM

## 2023-02-27 DIAGNOSIS — R1030 Lower abdominal pain, unspecified: Secondary | ICD-10-CM | POA: Diagnosis present

## 2023-02-27 DIAGNOSIS — R040 Epistaxis: Secondary | ICD-10-CM | POA: Diagnosis present

## 2023-02-27 LAB — URINALYSIS, ROUTINE W REFLEX MICROSCOPIC
Bilirubin Urine: NEGATIVE
Glucose, UA: NEGATIVE mg/dL
Hgb urine dipstick: NEGATIVE
Ketones, ur: NEGATIVE mg/dL
Leukocytes,Ua: NEGATIVE
Nitrite: NEGATIVE
Protein, ur: 30 mg/dL — AB
Specific Gravity, Urine: 1.03 (ref 1.005–1.030)
pH: 5 (ref 5.0–8.0)

## 2023-02-27 LAB — CBC WITH DIFFERENTIAL/PLATELET
Abs Immature Granulocytes: 0.02 10*3/uL (ref 0.00–0.07)
Basophils Absolute: 0 10*3/uL (ref 0.0–0.1)
Basophils Relative: 0 %
Eosinophils Absolute: 0.1 10*3/uL (ref 0.0–0.5)
Eosinophils Relative: 1 %
HCT: 26.5 % — ABNORMAL LOW (ref 36.0–46.0)
Hemoglobin: 8.9 g/dL — ABNORMAL LOW (ref 12.0–15.0)
Immature Granulocytes: 0 %
Lymphocytes Relative: 23 %
Lymphs Abs: 1.5 10*3/uL (ref 0.7–4.0)
MCH: 30 pg (ref 26.0–34.0)
MCHC: 33.6 g/dL (ref 30.0–36.0)
MCV: 89.2 fL (ref 80.0–100.0)
Monocytes Absolute: 0.3 10*3/uL (ref 0.1–1.0)
Monocytes Relative: 5 %
Neutro Abs: 4.4 10*3/uL (ref 1.7–7.7)
Neutrophils Relative %: 71 %
Platelets: 237 10*3/uL (ref 150–400)
RBC: 2.97 MIL/uL — ABNORMAL LOW (ref 3.87–5.11)
RDW: 12.1 % (ref 11.5–15.5)
WBC: 6.3 10*3/uL (ref 4.0–10.5)
nRBC: 0 % (ref 0.0–0.2)

## 2023-02-27 LAB — COMPREHENSIVE METABOLIC PANEL
ALT: 8 U/L (ref 0–44)
AST: 12 U/L — ABNORMAL LOW (ref 15–41)
Albumin: 2.8 g/dL — ABNORMAL LOW (ref 3.5–5.0)
Alkaline Phosphatase: 42 U/L (ref 38–126)
Anion gap: 7 (ref 5–15)
BUN: 5 mg/dL — ABNORMAL LOW (ref 6–20)
CO2: 27 mmol/L (ref 22–32)
Calcium: 9.1 mg/dL (ref 8.9–10.3)
Chloride: 101 mmol/L (ref 98–111)
Creatinine, Ser: 0.54 mg/dL (ref 0.44–1.00)
GFR, Estimated: >60 mL/min (ref >60)
Glucose, Bld: 74 mg/dL (ref 70–99)
Potassium: 3 mmol/L — ABNORMAL LOW (ref 3.5–5.1)
Sodium: 135 mmol/L (ref 135–145)
Total Bilirubin: 0.3 mg/dL (ref <1.2)
Total Protein: 6.1 g/dL — ABNORMAL LOW (ref 6.5–8.1)

## 2023-02-27 MED ORDER — CYCLOBENZAPRINE HCL 5 MG PO TABS: 5.0000 mg | ORAL_TABLET | Freq: Three times a day (TID) | ORAL | 0 refills | Status: DC | PRN

## 2023-02-27 MED ORDER — CYCLOBENZAPRINE HCL 5 MG PO TABS
10.0000 mg | ORAL_TABLET | Freq: Once | ORAL | Status: AC
  Administered 2023-02-27: 10 mg via ORAL
  Filled 2023-02-27: qty 2

## 2023-02-27 MED ORDER — FERROUS SULFATE 325 (65 FE) MG PO TABS: 325.0000 mg | ORAL_TABLET | ORAL | 0 refills | Status: DC

## 2023-02-27 MED ORDER — ACETAMINOPHEN 500 MG PO TABS
1000.0000 mg | ORAL_TABLET | Freq: Once | ORAL | Status: AC
  Administered 2023-02-27: 1000 mg via ORAL
  Filled 2023-02-27: qty 2

## 2023-02-27 NOTE — MAU Note (Addendum)
Christy Gonzalez is a 27 y.o. at [redacted]w[redacted]d here in MAU reporting: been having the same pain, lower abd and lower back. Pain comes and goes.  Sharp and cramping. Worse when sitting down, sitting still. No vag bleeding or d/c.  Denies urinary pain, but feels pressure. Denies diarrhea or constipation.  Had nose bleed this morning.   Onset of complaint: 12/14 Pain score: 8 Vitals:   02/27/23 1558  BP: (!) 101/54  Pulse: 75  Resp: 16  Temp: 98.2 F (36.8 C)  SpO2: 100%     FHT:162 Lab orders placed from triage:  urine   Abd soft on palpation. No guarding.

## 2023-02-27 NOTE — MAU Provider Note (Cosign Needed Addendum)
History     CSN: [id number]  Arrival date and time: 02/27/23 1444   None     Chief Complaint  Patient presents with   Abdominal Pain   Back Pain   Christy Gonzalez is a 27 y.o. G4P3003 at [redacted]w[redacted]d who receives care at [facility].  She presents today for continued abdominal pain  x 7 days Patient reports abdominal pain is rated 8/10 on pain scale. She reports ongoing N/V ( Despite antiemetics prescribed) and limited amount of food intake but able to drink water. Denies any fever, chills and offers no OB c/o at this time. Patient denies VB, LOF, cramping. Denies any dysuria and is able to urinate w/o difficulty. Patient was last seen in MAU on 02/21/23 for similar c/o and was prescribed antiemetics and  flexeril for likely MSK pain and N/V in pregnancy. Patient reports home management with these medications have not resolved  her ongoing abdominal pain.   OB History     Gravida  4   Para  3   Term  3   Preterm      AB      Living  3      SAB      IAB      Ectopic      Multiple  0   Live Births  3           Past Medical History:  Diagnosis Date   Anemia    Iron deficiency     Past Surgical History:  Procedure Laterality Date   NO PAST SURGERIES      Family History  Problem Relation Age of Onset   Hypertension Mother     Social History   Tobacco Use   Smoking status: Some Days    Types: Cigars   Smokeless tobacco: Never   Tobacco comments:    <5 cigars a day  Substance Use Topics   Alcohol use: No   Drug use: Yes    Types: Marijuana    Comment: 1 month ago    Allergies: No Known Allergies  Medications Prior to Admission  Medication Sig Dispense Refill Last Dose/Taking   acetaminophen (TYLENOL) 500 MG tablet Take 500 mg by mouth every 6 (six) hours as needed.   02/27/2023 at  2:00 AM   cyclobenzaprine (FLEXERIL) 10 MG tablet Take 1 tablet (10 mg total) by mouth 2 (two) times daily as needed for muscle spasms. (Patient not  taking: Reported on 02/27/2023) 10 tablet 0 Not Taking   Doxylamine-Pyridoxine 10-10 MG TBEC Take 1 tablet by mouth in the morning and at bedtime. 60 tablet 0    famotidine (PEPCID) 20 MG tablet Take 1 tablet (20 mg total) by mouth 2 (two) times daily. 60 tablet 12    ondansetron (ZOFRAN-ODT) 8 MG disintegrating tablet Take 1 tablet (8 mg total) by mouth every 8 (eight) hours as needed for nausea. 20 tablet 0    promethazine (PHENERGAN) 25 MG tablet Take 25 mg by mouth every 6 (six) hours as needed for nausea or vomiting.      scopolamine (TRANSDERM-SCOP) 1 MG/3DAYS Place 1 patch (1.5 mg total) onto the skin every 3 (three) days. 10 patch 12     Review of Systems  Constitutional:  Positive for appetite change. Negative for chills and fever.  HENT:  Positive for nosebleeds.   Gastrointestinal:  Positive for abdominal pain, nausea and vomiting.  Genitourinary:  Negative for difficulty urinating, dysuria, flank pain, frequency, vaginal  bleeding, vaginal discharge and vaginal pain.  Musculoskeletal:  Positive for back pain.   Physical Exam   Blood pressure (!) 101/54, pulse 75, temperature 98.2 F (36.8 C), temperature source Oral, resp. rate 16, height 5\' 4"  (1.626 m), weight 63.1 kg, last menstrual period 10/15/2022, SpO2 100%.  Physical Exam Vitals and nursing note reviewed.  Constitutional:      General: She is not in acute distress.    Appearance: She is well-developed.  Cardiovascular:     Rate and Rhythm: Normal rate.  Pulmonary:     Effort: Pulmonary effort is normal.  Abdominal:     Palpations: Abdomen is soft.     Tenderness: There is generalized abdominal tenderness and tenderness in the right upper quadrant. There is no right CVA tenderness, left CVA tenderness, guarding or rebound.  Skin:    General: Skin is warm.  Neurological:     Mental Status: She is alert and oriented to person, place, and time.  Psychiatric:        Mood and Affect: Mood normal.        Behavior:  Behavior normal.     Fetal Assessment 162 FHR via Doppler   MAU Course      Results for orders placed or performed during the hospital encounter of 02/27/23 (from the past 24 hours)  Urinalysis, Routine w reflex microscopic -Urine, Clean Catch     Status: Abnormal   Collection Time: 02/27/23  4:03 PM  Result Value Ref Range   Color, Urine AMBER (A) YELLOW   APPearance CLEAR CLEAR   Specific Gravity, Urine 1.030 1.005 - 1.030   pH 5.0 5.0 - 8.0   Glucose, UA NEGATIVE NEGATIVE mg/dL   Hgb urine dipstick NEGATIVE NEGATIVE   Bilirubin Urine NEGATIVE NEGATIVE   Ketones, ur NEGATIVE NEGATIVE mg/dL   Protein, ur 30 (A) NEGATIVE mg/dL   Nitrite NEGATIVE NEGATIVE   Leukocytes,Ua NEGATIVE NEGATIVE   RBC / HPF 0-5 0 - 5 RBC/hpf   WBC, UA 0-5 0 - 5 WBC/hpf   Bacteria, UA FEW (A) NONE SEEN   Squamous Epithelial / HPF 0-5 0 - 5 /HPF   Mucus PRESENT   CBC with Differential/Platelet     Status: Abnormal   Collection Time: 02/27/23  5:12 PM  Result Value Ref Range   WBC 6.3 4.0 - 10.5 K/uL   RBC 2.97 (L) 3.87 - 5.11 MIL/uL   Hemoglobin 8.9 (L) 12.0 - 15.0 g/dL   HCT 04.5 (L) 40.9 - 81.1 %   MCV 89.2 80.0 - 100.0 fL   MCH 30.0 26.0 - 34.0 pg   MCHC 33.6 30.0 - 36.0 g/dL   RDW 91.4 78.2 - 95.6 %   Platelets 237 150 - 400 K/uL   nRBC 0.0 0.0 - 0.2 %   Neutrophils Relative % 71 %   Neutro Abs 4.4 1.7 - 7.7 K/uL   Lymphocytes Relative 23 %   Lymphs Abs 1.5 0.7 - 4.0 K/uL   Monocytes Relative 5 %   Monocytes Absolute 0.3 0.1 - 1.0 K/uL   Eosinophils Relative 1 %   Eosinophils Absolute 0.1 0.0 - 0.5 K/uL   Basophils Relative 0 %   Basophils Absolute 0.0 0.0 - 0.1 K/uL   Immature Granulocytes 0 %   Abs Immature Granulocytes 0.02 0.00 - 0.07 K/uL  Comprehensive metabolic panel     Status: Abnormal   Collection Time: 02/27/23  5:12 PM  Result Value Ref Range   Sodium 135 135 - 145  mmol/L   Potassium 3.0 (L) 3.5 - 5.1 mmol/L   Chloride 101 98 - 111 mmol/L   CO2 27 22 - 32 mmol/L    Glucose, Bld 74 70 - 99 mg/dL   BUN 5 (L) 6 - 20 mg/dL   Creatinine, Ser 0.16 0.44 - 1.00 mg/dL   Calcium 9.1 8.9 - 01.0 mg/dL   Total Protein 6.1 (L) 6.5 - 8.1 g/dL   Albumin 2.8 (L) 3.5 - 5.0 g/dL   AST 12 (L) 15 - 41 U/L   ALT 8 0 - 44 U/L   Alkaline Phosphatase 42 38 - 126 U/L   Total Bilirubin 0.3 <1.2 mg/dL   GFR, Estimated >93 >23 mL/min   Anion gap 7 5 - 15     Hgb 12.5-> 10.3-> 8.9 (c/w anemia)   Verbal report received from [facility] sonographer with a right  enlarged adnexal area (suspicious for additional testing)  MRI for additional imaging concern for possible appendicitis    MDM  Previous notes reviewed from prior visits and labs reviewed  Physical exam performed Imaging ultrasound OB and RUQ ordered for chronic abdominal pain and N/V in pregnancy MRI ordered for additional imaging d/t limitations of abdominal u/s  to DX acute pathology ( See above note)  Labs ordered (r/o cholecystitis/ cholelithiasis or acute pathology)  Lab Orders         Culture, OB Urine         Urinalysis, Routine w reflex microscopic -Urine, Clean Catch         CBC with Differential/Platelet         Comprehensive metabolic panel     RX for Ferrous sulfate with counseling    Assessment and Plan  27 yo  G4P3003  SIUP at [redacted] weeks GA    ASSESSMENT - Abdominal pain in pregnancy r/o acute pathology - Anemia in pregnancy    PLAN -Abdominal son OB/RUQ sono - MRI  - RX for ferrous sulfate 325 mg every other day  - Colman Cater NP 02/27/2023, 4:19 PM   ( Report signed out to Wynelle Bourgeois CNM @ 2022) Colman Cater, NP 02/27/2023 8:24 PM   MRI showed no abnormalities [place] MFM OB Limited Result Date: 02/27/2023 ----------------------------------------------------------------------  OBSTETRICS REPORT                       (Signed Final 02/27/2023 11:22 pm) ---------------------------------------------------------------------- Patient Info  ID #:       [id number]                           D.O.B.:  1995/03/23 (27 yrs)(F)  Name:       Christy Gonzalez                Visit Date: 02/27/2023 07:05 pm ---------------------------------------------------------------------- Performed By  Attending:        Noralee Space MD        Referred By:       [facility] MAU/Triage  Performed By:     Emeline Darling BS,      [facility]                                                              [facility] ---------------------------------------------------------------------- Orders  #  Description                           Code        Ordered By  1  [place] MFM OB LIMITED                     82956.21    Marcell Barlow ----------------------------------------------------------------------  #  Order #                     Accession #                Episode #  1  [id number]                   [id number]                 [id number] ---------------------------------------------------------------------- Indications  [redacted] weeks gestation of pregnancy                 Z3A.20  Pelvic pain affecting pregnancy in second       O26.892  trimester (RLQ pain) ---------------------------------------------------------------------- Fetal Evaluation  Num Of Fetuses:          1  Fetal Heart Rate(bpm):   154  Cardiac Activity:        Observed  Presentation:            Cephalic  Placenta:                Posterior  P. Cord Insertion:       Visualized  Amniotic Fluid  AFI FV:      Within normal limits                              Largest Pocket(cm)                              6.9 ---------------------------------------------------------------------- OB History  Gravidity:    4         Term:   3        Prem:   0        SAB:   0  TOP:          0       Ectopic:  0        Living: 3 ---------------------------------------------------------------------- Gestational Age  LMP:           19w 2d        Date:   10/15/22  EDD:   07/22/23  Best:          Cherylann Parr 0d     Det. By:  Marcella Dubs         EDD:   07/17/23 ---------------------------------------------------------------------- Anatomy  Diaphragm:             Appears normal         Abdomen:                Appears normal  Stomach:               Appears normal, left   Bladder:                Appears normal                         sided ---------------------------------------------------------------------- Cervix Uterus Adnexa  Cervix  Length:            3.4  cm.  Normal appearance by transabdominal scan  Right Ovary  Within normal limits.  Left Ovary  Within normal limits.  Adnexa  Adnexal mass visualized measures 3.7 x 2.2 x  2.4 cm ---------------------------------------------------------------------- Impression  Patient is being evaluated at the MAU with c/o abdominal  pain.  A limited ultrasound study was performed. Amniotic fluid is  normal and good fetal activity is seen. Placenta appears  normal with no evidence of retroplacental clots.  Both ovaries appear normal. A small right adnexal cystic  mass measuring 3.7 x 3.3 x 2.4 cm is seen. Color-Doppler  flow did not show increased vascularity.  Patient will be having MRI to rule out R/o acute pathology. ----------------------------------------------------------------------                  Noralee Space, MD Electronically Signed Final Report   02/27/2023 11:22 pm ----------------------------------------------------------------------   MR ABDOMEN WO CONTRAST Result Date: 02/27/2023 CLINICAL DATA:  Acute nonlocalized abdominal pain. Right upper quadrant pain. Twenty weeks pregnant. EXAM: MRI ABDOMEN AND PELVIS WITHOUT CONTRAST TECHNIQUE: Multiplanar multisequence MR imaging of the abdomen and pelvis was performed. No intravenous contrast was administered. COMPARISON:  Ultrasound right upper quadrant 02/27/2023 FINDINGS: COMBINED FINDINGS FOR BOTH MR ABDOMEN AND PELVIS Lower chest: Visualization of the  lung bases is limited by motion artifact and artifact from the patient's arms. No obvious abnormality is identified. Hepatobiliary: No focal liver lesions. Gallbladder and bile ducts are normal. Pancreas: No mass, inflammatory changes, or other parenchymal abnormality identified. Spleen:  Within normal limits in size and appearance. Adrenals/Urinary Tract: No masses identified. No evidence of hydronephrosis. Stomach/Bowel: Stomach, small bowel, and colon are not abnormally distended. No wall thickening or inflammatory changes. Appendix is normal. Vascular/Lymphatic: Normal caliber abdominal aorta. No pathologic lymphadenopathy. Reproductive: There is a single intrauterine pregnancy. The fetus is currently in footling breech presentation with spine superiorly and head to the maternal right side. Placenta is posterior and fundal. No evidence of placental abruption. Ovaries are normal. Other:  No free air or free fluid in the abdomen. Musculoskeletal: No suspicious bone lesions identified. IMPRESSION: 1. No acute process is demonstrated in the abdomen or pelvis. 2. Intrauterine pregnancy is identified. Electronically Signed   By: Burman Nieves M.D.   On: 02/27/2023 21:52   MR PELVIS WO CONTRAST Result Date: 02/27/2023 CLINICAL DATA:  Acute nonlocalized abdominal pain. Right upper quadrant pain. Twenty weeks pregnant. EXAM: MRI ABDOMEN AND PELVIS WITHOUT CONTRAST TECHNIQUE: Multiplanar multisequence MR imaging of the abdomen and pelvis was performed. No intravenous  contrast was administered. COMPARISON:  Ultrasound right upper quadrant 02/27/2023 FINDINGS: COMBINED FINDINGS FOR BOTH MR ABDOMEN AND PELVIS Lower chest: Visualization of the lung bases is limited by motion artifact and artifact from the patient's arms. No obvious abnormality is identified. Hepatobiliary: No focal liver lesions. Gallbladder and bile ducts are normal. Pancreas: No mass, inflammatory changes, or other parenchymal abnormality identified.  Spleen:  Within normal limits in size and appearance. Adrenals/Urinary Tract: No masses identified. No evidence of hydronephrosis. Stomach/Bowel: Stomach, small bowel, and colon are not abnormally distended. No wall thickening or inflammatory changes. Appendix is normal. Vascular/Lymphatic: Normal caliber abdominal aorta. No pathologic lymphadenopathy. Reproductive: There is a single intrauterine pregnancy. The fetus is currently in footling breech presentation with spine superiorly and head to the maternal right side. Placenta is posterior and fundal. No evidence of placental abruption. Ovaries are normal. Other:  No free air or free fluid in the abdomen. Musculoskeletal: No suspicious bone lesions identified. IMPRESSION: 1. No acute process is demonstrated in the abdomen or pelvis. 2. Intrauterine pregnancy is identified. Electronically Signed   By: Burman Nieves M.D.   On: 02/27/2023 21:52   US Abdomen Limited RUQ (LIVER/GB) Result Date: 02/27/2023 CLINICAL DATA:  Right upper quadrant pain. Chronic right upper quadrant pain. Twenty weeks pregnant. EXAM: ULTRASOUND ABDOMEN LIMITED RIGHT UPPER QUADRANT COMPARISON:  None Available. FINDINGS: Gallbladder: Mild layering sludge in the gallbladder. No discrete stones identified. No gallbladder wall thickening or edema. Common bile duct: Diameter: 2 mm, normal Liver: No focal lesion identified. Within normal limits in parenchymal echogenicity. Portal vein is patent on color Doppler imaging with normal direction of blood flow towards the liver. Other: Limited incidental images of the right kidney demonstrate no hydronephrosis. IMPRESSION: 1. Mild layering sludge in the gallbladder. No cholelithiasis. No additional signs to suggest acute cholecystitis. 2. No bile duct dilatation.  Liver is unremarkable. Electronically Signed   By: Burman Nieves M.D.   On: 02/27/2023 20:45   Patient feels better States pain is mainly when walking around Pain location  consistent with round ligament pain Discussed pregnancy support belt  A:  Single IUP at [redacted]w[redacted]d       Right lower quadrant pain       Probable round ligament pain       Ongoing nausea and vomiting  P:   Discharge home        Pregnancy support belt        Continue antiemetics prn        Followup in office       Encouraged to return if she develops worsening of symptoms, increase in pain, fever, or other concerning symptoms.   Aviva Signs, CNM

## 2023-02-28 ENCOUNTER — Inpatient Hospital Stay (HOSPITAL_COMMUNITY)
Admission: AD | Admit: 2023-02-28 | Discharge: 2023-02-28 | Disposition: A | Discharge status: Home / Self Care | Payer: Medicaid Other | Attending: Obstetrics and Gynecology | Admitting: Obstetrics and Gynecology

## 2023-02-28 DIAGNOSIS — O26892 Other specified pregnancy related conditions, second trimester: Secondary | ICD-10-CM | POA: Insufficient documentation

## 2023-02-28 DIAGNOSIS — Z3A20 20 weeks gestation of pregnancy: Secondary | ICD-10-CM

## 2023-02-28 DIAGNOSIS — R102 Pelvic and perineal pain unspecified side: Secondary | ICD-10-CM

## 2023-02-28 DIAGNOSIS — R1031 Right lower quadrant pain: Secondary | ICD-10-CM | POA: Diagnosis not present

## 2023-02-28 LAB — URINALYSIS, ROUTINE W REFLEX MICROSCOPIC
Bacteria, UA: NONE SEEN
Bilirubin Urine: NEGATIVE
Glucose, UA: NEGATIVE mg/dL
Hgb urine dipstick: NEGATIVE
Ketones, ur: 80 mg/dL — AB
Leukocytes,Ua: NEGATIVE
Nitrite: NEGATIVE
Protein, ur: 30 mg/dL — AB
Specific Gravity, Urine: 1.029 (ref 1.005–1.030)
pH: 5 (ref 5.0–8.0)

## 2023-02-28 MED ORDER — IBUPROFEN 600 MG PO TABS
600.0000 mg | ORAL_TABLET | Freq: Once | ORAL | Status: AC
  Administered 2023-02-28: 600 mg via ORAL
  Filled 2023-02-28: qty 1

## 2023-02-28 MED ORDER — CYCLOBENZAPRINE HCL 5 MG PO TABS
10.0000 mg | ORAL_TABLET | Freq: Once | ORAL | Status: AC
Start: 2023-02-28 — End: 2023-02-28
  Administered 2023-02-28: 10 mg via ORAL
  Filled 2023-02-28: qty 2

## 2023-02-28 NOTE — MAU Note (Signed)
..  Christy Gonzalez is a 27 y.o. at [redacted]w[redacted]d here in MAU reporting: lower abdominal pain that has returned from earlier. Reports that the pain started back up on the way home. The pain comes and goes and feels like tightening. Denies vaginal bleeding or leaking of fluid.  +FM  Pain score: 10/10 Vitals:   02/28/23 0530  BP: (!) 115/58  Pulse: 87  Resp: 17  Temp: 97.9 F (36.6 C)  SpO2: 100%     FHT:150 Lab orders placed from triage:  UA

## 2023-02-28 NOTE — MAU Provider Note (Signed)
Chief Complaint:  Abdominal Pain   Event Date/Time   First Provider Initiated Contact with Patient 02/28/23 0551     HPI: Christy Gonzalez is a 27 y.o. G4P3003 at 25w1dwho was just discharged 7 hours ago after a lengthy and thorough evaluation of pelvic pain presents to maternity admissions reporting that pain has returned.  States it started cramping as she was leaving last night and is now more severe.  States feels like it is at "the top of my vagina" and baby is pushing down.    Pain last night was mostly right lower quadrant and an US showed a small right cyst.  An MRI was done to evaluate that cyst and there was no evidence of any cyst or other abnormality in the pelvis.  Determination was made that the pain was most consistent with round ligament pain.    Abdominal Pain This is a recurrent problem. The problem occurs intermittently. The pain is located in the suprapubic region. Pertinent negatives include no constipation, diarrhea, fever or nausea.   RN note: Christy Gonzalez is a 27 y.o. at [redacted]w[redacted]d here in MAU reporting: lower abdominal pain that has returned from earlier. Reports that the pain started back up on the way home. The pain comes and goes and feels like tightening. Denies vaginal bleeding or leaking of fluid.  +FM  Pain score: 10/10  Past Medical History: Past Medical History:  Diagnosis Date   Anemia    Iron deficiency     Past obstetric history: OB History  Gravida Para Term Preterm AB Living  4 3 3   3   SAB IAB Ectopic Multiple Live Births     0 3    # Outcome Date GA Lbr Len/2nd Weight Sex Type Anes PTL Lv  4 Current           3 Term 07/10/18 [redacted]w[redacted]d 00:40 / 00:11 2860 g M Vag-Spont EPI  LIV  2 Term 05/20/15 [redacted]w[redacted]d 07:50 / 00:03 2620 g F Vag-Spont None  LIV  1 Term      Vag-Spont   LIV    Past Surgical History: Past Surgical History:  Procedure Laterality Date   NO PAST SURGERIES      Family History: Family History  Problem Relation Age of Onset    Hypertension Mother     Social History: Social History   Tobacco Use   Smoking status: Some Days    Types: Cigars   Smokeless tobacco: Never   Tobacco comments:    <5 cigars a day  Substance Use Topics   Alcohol use: No   Drug use: Yes    Types: Marijuana    Comment: 1 month ago    Allergies: No Known Allergies  Meds:  Medications Prior to Admission  Medication Sig Dispense Refill Last Dose/Taking   acetaminophen (TYLENOL) 500 MG tablet Take 500 mg by mouth every 6 (six) hours as needed.      cyclobenzaprine (FLEXERIL) 5 MG tablet Take 1 tablet (5 mg total) by mouth every 8 (eight) hours as needed. 30 tablet 0    Doxylamine-Pyridoxine 10-10 MG TBEC Take 1 tablet by mouth in the morning and at bedtime. 60 tablet 0    famotidine (PEPCID) 20 MG tablet Take 1 tablet (20 mg total) by mouth 2 (two) times daily. 60 tablet 12    ferrous sulfate 325 (65 FE) MG tablet Take 1 tablet (325 mg total) by mouth every other day. 45 tablet 0    ondansetron (  ZOFRAN-ODT) 8 MG disintegrating tablet Take 1 tablet (8 mg total) by mouth every 8 (eight) hours as needed for nausea. 20 tablet 0    promethazine (PHENERGAN) 25 MG tablet Take 25 mg by mouth every 6 (six) hours as needed for nausea or vomiting.      scopolamine (TRANSDERM-SCOP) 1 MG/3DAYS Place 1 patch (1.5 mg total) onto the skin every 3 (three) days. 10 patch 12     I have reviewed patient's Past Medical Hx, Surgical Hx, Family Hx, Social Hx, medications and allergies.   ROS:  Review of Systems  Constitutional:  Negative for chills and fever.  Respiratory:  Negative for shortness of breath.   Gastrointestinal:  Positive for abdominal pain. Negative for constipation, diarrhea and nausea.  Genitourinary:  Positive for pelvic pain. Negative for vaginal bleeding.   Other systems negative  Physical Exam  Patient Vitals for the past 24 hrs:  BP Temp Temp src Pulse Resp SpO2 Height Weight  02/28/23 0530 (!) 115/58 97.9 F (36.6 C) Oral  87 17 100 % 5\' 4"  (1.626 m) 59.5 kg   Constitutional: Well-developed, well-nourished female in no acute distress.  Cardiovascular: normal rate  Respiratory: normal effort GI: Abd soft, non-tender, gravid appropriate for gestational age.   No rebound or guarding. MS: Extremities nontender, no edema, normal ROM Neurologic: Alert and oriented x 4.  GU: Neg CVAT.  PELVIC EXAM:  Cervix is long and closed   FHT:  150   Labs: Results for orders placed or performed during the hospital encounter of 02/28/23 (from the past 24 hours)  Urinalysis, Routine w reflex microscopic -Urine, Clean Catch     Status: Abnormal   Collection Time: 02/28/23  4:33 AM  Result Value Ref Range   Color, Urine YELLOW YELLOW   APPearance CLEAR CLEAR   Specific Gravity, Urine 1.029 1.005 - 1.030   pH 5.0 5.0 - 8.0   Glucose, UA NEGATIVE NEGATIVE mg/dL   Hgb urine dipstick NEGATIVE NEGATIVE   Bilirubin Urine NEGATIVE NEGATIVE   Ketones, ur 80 (A) NEGATIVE mg/dL   Protein, ur 30 (A) NEGATIVE mg/dL   Nitrite NEGATIVE NEGATIVE   Leukocytes,Ua NEGATIVE NEGATIVE   RBC / HPF 0-5 0 - 5 RBC/hpf   WBC, UA 0-5 0 - 5 WBC/hpf   Bacteria, UA NONE SEEN NONE SEEN   Squamous Epithelial / HPF 0-5 0 - 5 /HPF   Mucus PRESENT       Imaging:  [place] and MRI from last night are listed in last note.  No pathology identified.  MAU Course/MDM: I have reviewed the triage vital signs and the nursing notes.   Pertinent labs & imaging results that were available during my care of the patient were reviewed by me and considered in my medical decision making (see chart for details).      I have reviewed her medical records including past results, notes and treatments.   I have ordered labs and reviewed results.   Consult Dr Berton Lan with presentation, exam findings and test results.  Treatments in MAU included Ibuprofen given for presumed uterine cramping.  This somewhat relieved her pain down to "8". .    Assessment: Single IUP at  [redacted]w[redacted]d Pelvic pain/cramping of undetermined etiology.  Plan: Discharge home Continue meds as ordered before Pt to call office to arrange earlier appt Follow up in Office for prenatal visits and recheck Encouraged to return if she develops worsening of symptoms, increase in pain, fever, or other concerning symptoms.  Pt stable at time of discharge.  Wynelle Bourgeois CNM, MSN Certified Nurse-Midwife 02/28/2023 5:52 AM

## 2023-03-01 LAB — CULTURE, OB URINE: Culture: NO GROWTH

## 2023-03-11 ENCOUNTER — Encounter (HOSPITAL_COMMUNITY): Payer: Self-pay | Admitting: Obstetrics and Gynecology

## 2023-03-11 ENCOUNTER — Inpatient Hospital Stay (HOSPITAL_COMMUNITY): Payer: Medicaid Other

## 2023-03-11 ENCOUNTER — Other Ambulatory Visit: Payer: Self-pay

## 2023-03-11 ENCOUNTER — Inpatient Hospital Stay (HOSPITAL_COMMUNITY)
Admission: AD | Admit: 2023-03-11 | Discharge: 2023-03-13 | DRG: 832 | Disposition: A | Discharge status: Nursing Home (Includes ICF) | Payer: Medicaid Other | Attending: Obstetrics & Gynecology | Admitting: Obstetrics & Gynecology

## 2023-03-11 DIAGNOSIS — O26892 Other specified pregnancy related conditions, second trimester: Secondary | ICD-10-CM | POA: Diagnosis not present

## 2023-03-11 DIAGNOSIS — R102 Pelvic and perineal pain: Secondary | ICD-10-CM

## 2023-03-11 DIAGNOSIS — O4692 Antepartum hemorrhage, unspecified, second trimester: Secondary | ICD-10-CM

## 2023-03-11 DIAGNOSIS — D509 Iron deficiency anemia, unspecified: Secondary | ICD-10-CM | POA: Diagnosis present

## 2023-03-11 DIAGNOSIS — Z3A21 21 weeks gestation of pregnancy: Secondary | ICD-10-CM | POA: Diagnosis not present

## 2023-03-11 DIAGNOSIS — R1011 Right upper quadrant pain: Secondary | ICD-10-CM | POA: Diagnosis present

## 2023-03-11 DIAGNOSIS — R109 Unspecified abdominal pain: Principal | ICD-10-CM | POA: Diagnosis present

## 2023-03-11 DIAGNOSIS — O99012 Anemia complicating pregnancy, second trimester: Secondary | ICD-10-CM | POA: Diagnosis present

## 2023-03-11 DIAGNOSIS — O321XX0 Maternal care for breech presentation, not applicable or unspecified: Secondary | ICD-10-CM | POA: Diagnosis present

## 2023-03-11 DIAGNOSIS — B9689 Other specified bacterial agents as the cause of diseases classified elsewhere: Secondary | ICD-10-CM | POA: Diagnosis present

## 2023-03-11 DIAGNOSIS — K219 Gastro-esophageal reflux disease without esophagitis: Secondary | ICD-10-CM | POA: Diagnosis present

## 2023-03-11 DIAGNOSIS — O4592 Premature separation of placenta, unspecified, second trimester: Principal | ICD-10-CM | POA: Diagnosis present

## 2023-03-11 DIAGNOSIS — O23592 Infection of other part of genital tract in pregnancy, second trimester: Secondary | ICD-10-CM | POA: Diagnosis present

## 2023-03-11 DIAGNOSIS — Z8249 Family history of ischemic heart disease and other diseases of the circulatory system: Secondary | ICD-10-CM

## 2023-03-11 DIAGNOSIS — N760 Acute vaginitis: Secondary | ICD-10-CM | POA: Diagnosis present

## 2023-03-11 DIAGNOSIS — Z87891 Personal history of nicotine dependence: Secondary | ICD-10-CM

## 2023-03-11 DIAGNOSIS — O42912 Preterm premature rupture of membranes, unspecified as to length of time between rupture and onset of labor, second trimester: Secondary | ICD-10-CM | POA: Diagnosis present

## 2023-03-11 DIAGNOSIS — O42919 Preterm premature rupture of membranes, unspecified as to length of time between rupture and onset of labor, unspecified trimester: Secondary | ICD-10-CM | POA: Diagnosis present

## 2023-03-11 DIAGNOSIS — N839 Noninflammatory disorder of ovary, fallopian tube and broad ligament, unspecified: Secondary | ICD-10-CM | POA: Diagnosis present

## 2023-03-11 DIAGNOSIS — O99612 Diseases of the digestive system complicating pregnancy, second trimester: Secondary | ICD-10-CM | POA: Diagnosis present

## 2023-03-11 LAB — CBC WITH DIFFERENTIAL/PLATELET
Abs Immature Granulocytes: 0.09 10*3/uL — ABNORMAL HIGH (ref 0.00–0.07)
Basophils Absolute: 0 10*3/uL (ref 0.0–0.1)
Basophils Relative: 0 %
Eosinophils Absolute: 0 10*3/uL (ref 0.0–0.5)
Eosinophils Relative: 0 %
HCT: 26.4 % — ABNORMAL LOW (ref 36.0–46.0)
Hemoglobin: 9 g/dL — ABNORMAL LOW (ref 12.0–15.0)
Immature Granulocytes: 1 %
Lymphocytes Relative: 11 %
Lymphs Abs: 1.3 10*3/uL (ref 0.7–4.0)
MCH: 29.5 pg (ref 26.0–34.0)
MCHC: 34.1 g/dL (ref 30.0–36.0)
MCV: 86.6 fL (ref 80.0–100.0)
Monocytes Absolute: 0.5 10*3/uL (ref 0.1–1.0)
Monocytes Relative: 4 %
Neutro Abs: 10.3 10*3/uL — ABNORMAL HIGH (ref 1.7–7.7)
Neutrophils Relative %: 84 %
Platelets: 281 10*3/uL (ref 150–400)
RBC: 3.05 MIL/uL — ABNORMAL LOW (ref 3.87–5.11)
RDW: 12.3 % (ref 11.5–15.5)
WBC: 12.2 10*3/uL — ABNORMAL HIGH (ref 4.0–10.5)
nRBC: 0 % (ref 0.0–0.2)

## 2023-03-11 LAB — URINALYSIS, ROUTINE W REFLEX MICROSCOPIC
Bacteria, UA: NONE SEEN
Bilirubin Urine: NEGATIVE
Glucose, UA: NEGATIVE mg/dL
Ketones, ur: 5 mg/dL — AB
Nitrite: NEGATIVE
Protein, ur: 100 mg/dL — AB
Specific Gravity, Urine: 1.03 (ref 1.005–1.030)
pH: 5 (ref 5.0–8.0)

## 2023-03-11 LAB — RAPID URINE DRUG SCREEN, HOSP PERFORMED
Amphetamines: NOT DETECTED
Barbiturates: NOT DETECTED
Benzodiazepines: NOT DETECTED
Cocaine: NOT DETECTED
Opiates: NOT DETECTED
Tetrahydrocannabinol: POSITIVE — AB

## 2023-03-11 LAB — TYPE AND SCREEN
ABO/RH(D): O POS
Antibody Screen: NEGATIVE

## 2023-03-11 LAB — WET PREP, GENITAL
Sperm: NONE SEEN
Trich, Wet Prep: NONE SEEN
WBC, Wet Prep HPF POC: >=10 — AB (ref <10)
Yeast Wet Prep HPF POC: NONE SEEN

## 2023-03-11 MED ORDER — SODIUM CHLORIDE 0.9% FLUSH
3.0000 mL | Freq: Two times a day (BID) | INTRAVENOUS | Status: DC
  Administered 2023-03-11 – 2023-03-12 (×2): 3 mL via INTRAVENOUS

## 2023-03-11 MED ORDER — PRENATAL MULTIVITAMIN CH
1.0000 | ORAL_TABLET | Freq: Every day | ORAL | Status: DC
Start: 2023-03-12 — End: 2023-03-13
  Administered 2023-03-12: 1 via ORAL
  Filled 2023-03-11: qty 1

## 2023-03-11 MED ORDER — CALCIUM CARBONATE ANTACID 500 MG PO CHEW: 2.0000 | CHEWABLE_TABLET | ORAL | Status: DC | PRN

## 2023-03-11 MED ORDER — KETOROLAC TROMETHAMINE 60 MG/2ML IM SOLN
60.0000 mg | Freq: Once | INTRAMUSCULAR | Status: AC
  Administered 2023-03-11: 60 mg via INTRAMUSCULAR
  Filled 2023-03-11: qty 2

## 2023-03-11 MED ORDER — METRONIDAZOLE 500 MG PO TABS
500.0000 mg | ORAL_TABLET | Freq: Two times a day (BID) | ORAL | Status: DC
  Administered 2023-03-11 – 2023-03-12 (×3): 500 mg via ORAL
  Filled 2023-03-11 (×6): qty 1

## 2023-03-11 MED ORDER — DOCUSATE SODIUM 100 MG PO CAPS
100.0000 mg | ORAL_CAPSULE | Freq: Every day | ORAL | Status: DC
  Administered 2023-03-12: 100 mg via ORAL
  Filled 2023-03-11: qty 1

## 2023-03-11 MED ORDER — IBUPROFEN 600 MG PO TABS
600.0000 mg | ORAL_TABLET | Freq: Four times a day (QID) | ORAL | Status: DC | PRN
  Administered 2023-03-12 (×2): 600 mg via ORAL
  Filled 2023-03-11 (×2): qty 1

## 2023-03-11 MED ORDER — ACETAMINOPHEN 325 MG PO TABS: 650.0000 mg | ORAL_TABLET | Freq: Once | ORAL | Status: DC

## 2023-03-11 MED ORDER — ACETAMINOPHEN 325 MG PO TABS
650.0000 mg | ORAL_TABLET | ORAL | Status: DC | PRN
  Administered 2023-03-11 – 2023-03-12 (×3): 650 mg via ORAL
  Filled 2023-03-11 (×3): qty 2

## 2023-03-11 MED ORDER — SODIUM CHLORIDE 0.9% FLUSH: 3.0000 mL | INTRAVENOUS | Status: DC | PRN

## 2023-03-11 MED ORDER — POLYSACCHARIDE IRON COMPLEX 150 MG PO CAPS
150.0000 mg | ORAL_CAPSULE | Freq: Every day | ORAL | Status: DC
  Administered 2023-03-12: 150 mg via ORAL
  Filled 2023-03-11 (×2): qty 1

## 2023-03-11 MED ORDER — CYCLOBENZAPRINE HCL 5 MG PO TABS
5.0000 mg | ORAL_TABLET | Freq: Three times a day (TID) | ORAL | Status: DC | PRN
  Administered 2023-03-12 (×2): 5 mg via ORAL
  Filled 2023-03-11 (×2): qty 1

## 2023-03-11 MED ORDER — SODIUM CHLORIDE 0.9 % IV SOLN: 250.0000 mL | INTRAVENOUS | Status: AC | PRN

## 2023-03-11 NOTE — MAU Note (Signed)
 Christy Gonzalez is a 28 y.o. at [redacted]w[redacted]d here in MAU reporting: started yesterday, had white d/c, no irritation or itching.  Then it started going pink. Today has been more red and has passed some quarter sized clots.  Still having pain on the rt side, though not as bad. (?something about a cyst, is to get a referral from office) Onset of complaint: yesterday Pain score: mild, ,dull ache-ongoing Vitals:   03/11/23 1248  BP: (!) 102/52  Pulse: 87  Resp: 16  Temp: 98.2 F (36.8 C)  SpO2: 100%     FHT:166 Lab orders placed from triage:  urine

## 2023-03-11 NOTE — H&P (Signed)
 History     CSN: [id number]  Arrival date and time: 03/11/23 1231   Event Date/Time   First Provider Initiated Contact with Patient 03/11/23 1322      Chief Complaint  Patient presents with   Vaginal Bleeding   Abdominal Pain   HPI  [name] is a 28 y.o. female [phone] @ [redacted]w[redacted]d with continued RLQ abdominal pain along with new onset bleeding. This is her fourth visit to the MAU with right sided abdominal pain.   Yesterday her discharge was white and it has progressed to pink and now the bleeding is red with clots. She reports pain in the right upper Quadrant. The pain comes and goes. She has tried tylenol  and flexeril  with minimal relief. She currently rates her pain 8/10.   She reports 2 episodes of vomiting yesterday, none today. This is typical for her during the pregnancy to have some vomiting here and there. This is not a new symptom.  No sex or transvaginal US  recently.   OB History     Gravida  4   Para  3   Term  3   Preterm      AB      Living  3      SAB      IAB      Ectopic      Multiple  0   Live Births  3           Past Medical History:  Diagnosis Date   Anemia    Iron  deficiency     Past Surgical History:  Procedure Laterality Date   NO PAST SURGERIES      Family History  Problem Relation Age of Onset   Hypertension Mother    Fibroids Mother     Social History   Tobacco Use   Smoking status: Former    Types: Cigars   Smokeless tobacco: Never   Tobacco comments:    <5 cigars a day  Vaping Use   Vaping status: Never Used  Substance Use Topics   Alcohol use: No   Drug use: Yes    Types: Marijuana    Comment: last used within the last week    Allergies: No Known Allergies  Medications Prior to Admission  Medication Sig Dispense Refill Last Dose/Taking   acetaminophen  (TYLENOL ) 500 MG tablet Take 500 mg by mouth every 6 (six) hours as needed.   03/10/2023   cyclobenzaprine  (FLEXERIL ) 5 MG tablet Take 1  tablet (5 mg total) by mouth every 8 (eight) hours as needed. 30 tablet 0 03/10/2023   Doxylamine -Pyridoxine  10-10 MG TBEC Take 1 tablet by mouth in the morning and at bedtime. 60 tablet 0 Past Month   famotidine  (PEPCID ) 20 MG tablet Take 1 tablet (20 mg total) by mouth 2 (two) times daily. 60 tablet 12 Past Month   ferrous sulfate  325 (65 FE) MG tablet Take 1 tablet (325 mg total) by mouth every other day. 45 tablet 0 Past Week   ondansetron  (ZOFRAN -ODT) 8 MG disintegrating tablet Take 1 tablet (8 mg total) by mouth every 8 (eight) hours as needed for nausea. 20 tablet 0 03/10/2023   promethazine  (PHENERGAN ) 25 MG tablet Take 25 mg by mouth every 6 (six) hours as needed for nausea or vomiting.   Past Month   scopolamine  (TRANSDERM-SCOP) 1 MG/3DAYS Place 1 patch (1.5 mg total) onto the skin every 3 (three) days. 10 patch 12    Results for orders placed or  performed during the hospital encounter of 03/11/23 (from the past 48 hours)  Urinalysis, Routine w reflex microscopic -Urine, Clean Catch     Status: Abnormal   Collection Time: 03/11/23 12:58 PM  Result Value Ref Range   Color, Urine AMBER (A) YELLOW    Comment: BIOCHEMICALS MAY BE AFFECTED BY COLOR   APPearance HAZY (A) CLEAR   Specific Gravity, Urine 1.030 1.005 - 1.030   pH 5.0 5.0 - 8.0   Glucose, UA NEGATIVE NEGATIVE mg/dL   Hgb urine dipstick MODERATE (A) NEGATIVE   Bilirubin Urine NEGATIVE NEGATIVE   Ketones, ur 5 (A) NEGATIVE mg/dL   Protein, ur 899 (A) NEGATIVE mg/dL   Nitrite NEGATIVE NEGATIVE   Leukocytes,Ua MODERATE (A) NEGATIVE   RBC / HPF 0-5 0 - 5 RBC/hpf   WBC, UA 21-50 0 - 5 WBC/hpf   Bacteria, UA NONE SEEN NONE SEEN   Squamous Epithelial / HPF 0-5 0 - 5 /HPF   Mucus PRESENT     Comment: Performed at [facility], [address], [place], [place] [address]  Wet prep, genital     Status: Abnormal   Collection Time: 03/11/23  1:36 PM  Result Value Ref Range   Yeast Wet Prep HPF POC NONE SEEN NONE SEEN   Trich,  Wet Prep NONE SEEN NONE SEEN   Clue Cells Wet Prep HPF POC PRESENT (A) NONE SEEN   WBC, Wet Prep HPF POC >=10 (A) <10   Sperm NONE SEEN     Comment: Performed at [facility], [address] [place], [place] [address]  CBC with Differential/Platelet     Status: Abnormal   Collection Time: 03/11/23  1:44 PM  Result Value Ref Range   WBC 12.2 (H) 4.0 - 10.5 K/uL   RBC 3.05 (L) 3.87 - 5.11 MIL/uL   Hemoglobin 9.0 (L) 12.0 - 15.0 g/dL   HCT 73.5 (L) 63.9 - 53.9 %   MCV 86.6 80.0 - 100.0 fL   MCH 29.5 26.0 - 34.0 pg   MCHC 34.1 30.0 - 36.0 g/dL   RDW 87.6 88.4 - 84.4 %   Platelets 281 150 - 400 K/uL   nRBC 0.0 0.0 - 0.2 %   Neutrophils Relative % 84 %   Neutro Abs 10.3 (H) 1.7 - 7.7 K/uL   Lymphocytes Relative 11 %   Lymphs Abs 1.3 0.7 - 4.0 K/uL   Monocytes Relative 4 %   Monocytes Absolute 0.5 0.1 - 1.0 K/uL   Eosinophils Relative 0 %   Eosinophils Absolute 0.0 0.0 - 0.5 K/uL   Basophils Relative 0 %   Basophils Absolute 0.0 0.0 - 0.1 K/uL   Immature Granulocytes 1 %   Abs Immature Granulocytes 0.09 (H) 0.00 - 0.07 K/uL    Comment: Performed at [facility], [address], [place], [place] [address]  Type and screen [facility]     Status: None   Collection Time: 03/11/23  1:44 PM  Result Value Ref Range   ABO/RH(D) O POS    Antibody Screen NEG    Sample Expiration      03/14/2023,2359 Performed at [facility], [address], [place], [place] [address]      Review of Systems  Constitutional:  Negative for fever.  Gastrointestinal:  Positive for abdominal pain.  Genitourinary:  Positive for vaginal bleeding.  Neurological:  Negative for dizziness and headaches.   Physical Exam   Blood pressure ROLLEN)  89/46, pulse 82, temperature 98.2 F (36.8 C), temperature source Oral, resp. rate 16, height 5' 4 (1.626 m), weight 60.5 kg, last menstrual period 10/15/2022, SpO2 100%.  Physical Exam Constitutional:      General: She is not in  acute distress.    Appearance: She is well-developed. She is not ill-appearing, toxic-appearing or diaphoretic.  HENT:     Head: Normocephalic.  Eyes:     Pupils: Pupils are equal, round, and reactive to light.  Abdominal:     Palpations: Abdomen is soft.     Tenderness: There is abdominal tenderness in the right lower quadrant. There is no guarding or rebound.  Genitourinary:    Comments: Vagina - Large amount of pink vaginal discharge, no odor Cervix - No contact bleeding, no active bleeding. Small dark clot sitting at cervix.  Bimanual exam: Cervix closed, thick, posterior.  GC/Chlam, wet prep done Chaperone present for exam.   Skin:    General: Skin is warm.  Neurological:     Mental Status: She is alert.    MAU Course  Procedures None  MDM  Toradol  given IM< pain down to 4/10 O positive blood type. CBC  UA Wet prep and GC collected with speculum exam.  Discussed patient with Dr. Erik and Dr. Armond. Patient will be admitted for OBS and pain management.   Assessment and Plan   A:  1. Acute abdominal pain   2. Vaginal bleeding in pregnancy, second trimester   3. [redacted] weeks gestation of pregnancy   4. Bacterial vaginosis     P:  Adit to [facility] Dr. Armond to resume care.  Patient back to US  for better imaging of right ovary.    Dorita Nest I, NP 03/11/2023 3:10 PM

## 2023-03-11 NOTE — Progress Notes (Signed)
 Pt has mild RLQ pain.  Tolerating PO. No n/v CV RRR Physical Examination: General appearance - alert, well appearing, and in no distress  Physical Examination: Chest - clear to auscultation, no wheezes, rales or rhonchi, symmetric air entry Heart - normal rate and regular rhythm Abdomen - soft, nontender, nondistended, no masses or organomegaly Extremities - Homan's sign negative bilaterally  Second trimester bleeding with unknown source.  RH pos Abdominal pain slight increase in WBC Cxs done Bring in pt for observation and pad count.  US  placenta intact.   Ovarian mass seems bigger  now 4 cm will do US  to check flow Toradol  helped the pain Will give motrin , tylenol  and flexeril  for pain

## 2023-03-11 NOTE — Progress Notes (Addendum)
 Hospital day # 0 pregnancy at [redacted]w[redacted]d--pt was admitted on 1/4 @ 21.5 for with continued RLQ abdominal pain along with new onset vaginal bleeding bleeding. This is her fourth visit to the MAU with right sided abdominal pain. BV dx on admission and started on flagyl . US  1/3 breech, HR 180, posterior, largest pocket 4.7,  A limited ultrasound performed today shows a singleton intrauterine gestation in the breech presentation. Fetal movements were noted throughout today's exam. There was normal amniotic fluid noted. A normal-appearing posterior placenta is noted. A simple appearing 3 cm right adnexal cyst is noted. Pulsatile blood flow is present in the ovarian vessels.  Patient Active Problem List   Diagnosis Date Noted   Second trimester bleeding 03/11/2023   Abdominal pain 03/11/2023   BV (bacterial vaginosis) 03/11/2023   IDA (iron  deficiency anemia) 03/11/2023   Chlamydia infection affecting pregnancy in first trimester 11/20/2017   Spontaneous vaginal delivery 05/21/2015   Rubella non-immune status, antepartum 05/21/2015   Anemia of pregnancy 05/21/2015     Active Ambulatory Problems    Diagnosis Date Noted   Spontaneous vaginal delivery 05/21/2015   Rubella non-immune status, antepartum 05/21/2015   Anemia of pregnancy 05/21/2015   Chlamydia infection affecting pregnancy in first trimester 11/20/2017   Resolved Ambulatory Problems    Diagnosis Date Noted   Normal labor 07/10/2018   SVD (spontaneous vaginal delivery) 07/10/2018   Normal postpartum course 07/12/2018   Past Medical History:  Diagnosis Date   Anemia    Iron  deficiency      S:  Pt resting in bed, tear eyed, c/o cramps that are coming and going, with lower abdomen pain, pt discussed her vaginal bleeding changed from this morning and started to become watery. Pain went from pain of 9/10 with cxt, to getting motrin  and flexeril  with heating pad and resolved to 4/10. Discussed early term, and potential for PPROM,  will monitor, discussed getting US  limited to check fluid, cervical checked and noted to be fingertip/20/-2      Perception of contractions: none, irregular, every 2-4 minutes, palpated at cxt with moderate.       Vaginal bleeding: none now, brown, lighter than period, and appears like amniotic fluid that is serosanguinous.        Vaginal discharge:  watery, bloody, and brown, thin.   O: BP (!) 83/68 (BP Location: Left Arm)   Pulse 80   Temp 98 F (36.7 C)   Resp 17   Ht 5' 4 (1.626 m)   Wt 60.5 kg   LMP 10/15/2022   SpO2 98%   BMI 22.90 kg/m       Fetal tracings: FHT       Contractions:   Q2-4      Uterus gravid and non-tender      Extremities: extremities normal, atraumatic, no cyanosis or edema and no significant edema and no signs of DVT SVE: Fingertip/20/-2  PE from MAU on 03/10/2022: Physical Exam Constitutional:      General: She is not in acute distress.    Appearance: She is well-developed. She is not ill-appearing, toxic-appearing or diaphoretic.  HENT:     Head: Normocephalic.  Eyes:     Pupils: Pupils are equal, round, and reactive to light.  Abdominal:     Palpations: Abdomen is soft.     Tenderness: There is abdominal tenderness in the right lower quadrant. There is no guarding or rebound.  Genitourinary:    Comments: Vagina - Large amount of  pink vaginal discharge, no odor Cervix - No contact bleeding, no active bleeding. Small dark clot sitting at cervix.  Bimanual exam: Cervix closed, thick, posterior.  GC/Chlam, wet prep done Chaperone present for exam.   Skin:    General: Skin is warm.  Neurological:     Mental Status: She is alert.           Labs:  WBC 12.2-11.3, Wet Prep clue cells, UDS +MJ, hgb 9.0-8.3, KB Neg, RH+, Pending GC/C and UC.        Meds: Ketorolac  upon admission and current, motrin , Tylenol , and flexeril .   A: Hospital day # 0 pregnancy at [redacted]w[redacted]d--pt was admitted on 1/4 @ 21.5 for with continued RLQ abdominal pain along with new onset  vaginal bleeding bleeding. This is her fourth visit to the MAU with right sided abdominal pain. BV dx on admission and started on flagyl . US  1/3 breech, HR 180, posterior, largest pocket 4.7,  A limited ultrasound performed today shows a singleton intrauterine gestation in the breech presentation. Fetal movements were noted throughout today's exam. There was normal amniotic fluid noted. A normal-appearing posterior placenta is noted. A simple appearing 3 cm right adnexal cyst is noted. Pulsatile blood flow is present in the ovarian vessels. Pt remains stable but appears to be having preterm cxt and new onset of possible PPROM.   P: Continue current plan of care      Upcoming tests/treatments:        Monitor WBC counts      BV: flagyl  day #1 500mg  BID Q12H x7 days.      Anemia: Monitor and PO Iron .      Pain control with motrin /flexeril /tylenol .      Pending GC/C and UC      Limited US  ordered to r/o rupture with fluid count.       MDs will follow and Dr Armond updated.   Topacio Cella  CNM, FNP-C, PMHNP-BC  [address]  [place], [place] [address]  Cell: [phone]  Office Phone: [phone] Fax: [phone] 03/12/2023  11:24 AM

## 2023-03-12 ENCOUNTER — Other Ambulatory Visit: Payer: Self-pay

## 2023-03-12 ENCOUNTER — Encounter (HOSPITAL_COMMUNITY): Payer: Self-pay | Admitting: Obstetrics and Gynecology

## 2023-03-12 ENCOUNTER — Observation Stay (HOSPITAL_COMMUNITY): Payer: Medicaid Other

## 2023-03-12 DIAGNOSIS — R109 Unspecified abdominal pain: Secondary | ICD-10-CM

## 2023-03-12 DIAGNOSIS — O4692 Antepartum hemorrhage, unspecified, second trimester: Secondary | ICD-10-CM

## 2023-03-12 DIAGNOSIS — K219 Gastro-esophageal reflux disease without esophagitis: Secondary | ICD-10-CM | POA: Diagnosis present

## 2023-03-12 DIAGNOSIS — O42919 Preterm premature rupture of membranes, unspecified as to length of time between rupture and onset of labor, unspecified trimester: Secondary | ICD-10-CM | POA: Diagnosis present

## 2023-03-12 DIAGNOSIS — O26892 Other specified pregnancy related conditions, second trimester: Secondary | ICD-10-CM

## 2023-03-12 DIAGNOSIS — R1011 Right upper quadrant pain: Secondary | ICD-10-CM | POA: Diagnosis present

## 2023-03-12 DIAGNOSIS — O42912 Preterm premature rupture of membranes, unspecified as to length of time between rupture and onset of labor, second trimester: Secondary | ICD-10-CM

## 2023-03-12 DIAGNOSIS — O42112 Preterm premature rupture of membranes, onset of labor more than 24 hours following rupture, second trimester: Secondary | ICD-10-CM | POA: Diagnosis not present

## 2023-03-12 DIAGNOSIS — Z87891 Personal history of nicotine dependence: Secondary | ICD-10-CM | POA: Diagnosis not present

## 2023-03-12 DIAGNOSIS — Z3A21 21 weeks gestation of pregnancy: Secondary | ICD-10-CM

## 2023-03-12 DIAGNOSIS — O99012 Anemia complicating pregnancy, second trimester: Secondary | ICD-10-CM | POA: Diagnosis present

## 2023-03-12 DIAGNOSIS — R102 Pelvic and perineal pain: Secondary | ICD-10-CM

## 2023-03-12 DIAGNOSIS — O23592 Infection of other part of genital tract in pregnancy, second trimester: Secondary | ICD-10-CM | POA: Diagnosis present

## 2023-03-12 DIAGNOSIS — N839 Noninflammatory disorder of ovary, fallopian tube and broad ligament, unspecified: Secondary | ICD-10-CM | POA: Diagnosis present

## 2023-03-12 DIAGNOSIS — O99612 Diseases of the digestive system complicating pregnancy, second trimester: Secondary | ICD-10-CM | POA: Diagnosis present

## 2023-03-12 DIAGNOSIS — O321XX0 Maternal care for breech presentation, not applicable or unspecified: Secondary | ICD-10-CM | POA: Diagnosis present

## 2023-03-12 DIAGNOSIS — O4592 Premature separation of placenta, unspecified, second trimester: Secondary | ICD-10-CM | POA: Diagnosis present

## 2023-03-12 DIAGNOSIS — D509 Iron deficiency anemia, unspecified: Secondary | ICD-10-CM | POA: Diagnosis present

## 2023-03-12 DIAGNOSIS — B9689 Other specified bacterial agents as the cause of diseases classified elsewhere: Secondary | ICD-10-CM | POA: Diagnosis present

## 2023-03-12 DIAGNOSIS — Z8249 Family history of ischemic heart disease and other diseases of the circulatory system: Secondary | ICD-10-CM | POA: Diagnosis not present

## 2023-03-12 LAB — CBC
HCT: 24.8 % — ABNORMAL LOW (ref 36.0–46.0)
Hemoglobin: 8.3 g/dL — ABNORMAL LOW (ref 12.0–15.0)
MCH: 29 pg (ref 26.0–34.0)
MCHC: 33.5 g/dL (ref 30.0–36.0)
MCV: 86.7 fL (ref 80.0–100.0)
Platelets: 263 10*3/uL (ref 150–400)
RBC: 2.86 MIL/uL — ABNORMAL LOW (ref 3.87–5.11)
RDW: 12.4 % (ref 11.5–15.5)
WBC: 11.3 10*3/uL — ABNORMAL HIGH (ref 4.0–10.5)
nRBC: 0 % (ref 0.0–0.2)

## 2023-03-12 LAB — CULTURE, OB URINE: Culture: NO GROWTH

## 2023-03-12 LAB — KLEIHAUER-BETKE STAIN
Fetal Cells %: 0 %
Quantitation Fetal Hemoglobin: 0 mL

## 2023-03-12 MED ORDER — FENTANYL CITRATE (PF) 100 MCG/2ML IJ SOLN
100.0000 ug | Freq: Once | INTRAMUSCULAR | Status: AC
  Administered 2023-03-12: 100 ug via INTRAVENOUS
  Filled 2023-03-12: qty 2

## 2023-03-12 MED ORDER — SODIUM CHLORIDE 0.9 % IV SOLN
2.0000 g | Freq: Four times a day (QID) | INTRAVENOUS | Status: DC
  Administered 2023-03-12 – 2023-03-13 (×4): 2 g via INTRAVENOUS
  Filled 2023-03-12 (×4): qty 2000

## 2023-03-12 MED ORDER — FENTANYL CITRATE (PF) 100 MCG/2ML IJ SOLN
50.0000 ug | INTRAMUSCULAR | Status: DC | PRN
  Administered 2023-03-13: 50 ug via INTRAVENOUS
  Filled 2023-03-12: qty 2

## 2023-03-12 MED ORDER — AMOXICILLIN 500 MG PO CAPS: 500.0000 mg | ORAL_CAPSULE | Freq: Three times a day (TID) | ORAL | Status: DC

## 2023-03-12 MED ORDER — AZITHROMYCIN 250 MG PO TABS
1000.0000 mg | ORAL_TABLET | Freq: Once | ORAL | Status: AC
  Administered 2023-03-12: 1000 mg via ORAL
  Filled 2023-03-12: qty 4

## 2023-03-12 MED ORDER — SODIUM CHLORIDE 0.9 % IV SOLN: INTRAVENOUS | Status: DC

## 2023-03-12 MED ORDER — LACTATED RINGERS IV SOLN: INTRAVENOUS | Status: DC

## 2023-03-12 NOTE — Consult Note (Signed)
 MFM Note  Christy Gonzalez is a gravida 4 para 3 currently at 21 weeks and 6 days.  She was seen in consultation at the request of Dr. Armond due to vaginal bleeding and abdominal pain.    The patient presented to the MAU yesterday due to right lower quadrant abdominal pain.  This was her fourth visit to the MAU for the same complaint.  Due to recurrent abdominal pain, the patient was admitted overnight and given pain medication.  A limited ultrasound performed yesterday at the time of admission showed normal amniotic fluid.  A simple appearing 3 cm right adnexal cyst was noted during that exam.  Today, the patient continues to complain of intermittent right sided abdominal pain.  She is now leaking serosanguineous fluid.  An ultrasound performed this afternoon shows an EFW of 1 pound 3 ounces (541 g, 90th percentile).  The fetus is in the breech presentation.  Fetal body movements and fetal breathing movements were noted throughout today's exam.  A transvaginal ultrasound performed today showed a cervical length of 3.6 cm long without any signs of funneling.  There were no signs of placenta previa noted today.  Oligohydramnios with a total AFI of 3.1 cm is noted.  A lot of of debris, most likely blood is noted swirling within the amniotic fluid.    The amount of amniotic fluid noted today as compared to her ultrasound from yesterday is significantly less, indicating that she most likely has ruptured membranes.  The patient was advised that the blood stained fluid that she is currently leaking is most likely amniotic fluid.  The posterior fundal placenta appeared within normal limits.  There were no signs of retroplacental clots noted today.    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  She understands that not all cases of placental abruption are visible via ultrasound.  The patient was advised that fetal  viability is currently defined as delivery at between 23 to 24 weeks.  She was advised that currently, the NICU here at [facility] will only resuscitate babies that are delivered at 23 weeks or greater.    However, there are institutions (such as [facility], [place], or [facility]) in the state that may offer resuscitation at between 22 to 23 weeks.  I recommend that she discuss the neonatal outcomes for delivery at between 22 to 23 weeks with the NICU so that she can make an informed decision regarding whether or not she would want her baby to be resuscitated should she deliver at between 22 to 23 weeks.    Should the patient decide that she wants her baby resuscitated at 22 weeks, she should be transferred to another institution for management.  Should she decide that she only wants neonatal resuscitation for delivery at 23 weeks or greater, she should continue to be observed in the hospital at [facility].    She should receive a complete course of antenatal corticosteroids at 22 weeks and 5 days (at the end of this week) along with magnesium sulfate for fetal neuroprotection should she remain undelivered at that time.  Due to probable PPROM, it may be reasonable for her to be started on latency antibiotics to try to prolong her pregnancy for as long as possible.  The patient will make a decision regarding neonatal resuscitation after she speaks to the NICU.    As the fetus is in the breech presentation,  the patient understands that should she want everything possible to be done, she will require a classical C-section for delivery.  We will continue to follow her closely with you.

## 2023-03-12 NOTE — Progress Notes (Signed)
 Patient stated worsening pain in lower abdomen. I helped patient to restroom and noted blood and clots coming from vagina noting 75-100 cc in toilet. Called [place], [place] @ [phone].

## 2023-03-12 NOTE — Progress Notes (Signed)
 Pt without complaints.  She has leakage of fluid and VB.  Good FM  BP (!) 88/51 Comment: Patient states this BP is her normal  Pulse (!) 115   Temp 98.2 F (36.8 C) (Oral)   Resp 18   Ht 5' 4 (1.626 m)   Wt 60.5 kg   LMP 10/15/2022   SpO2 99%   BMI 22.90 kg/m   FHTS  present   Toco irregular, every 10-20 minutes  Pt in NAD CV RRR Lungs CTAB abd  Gravid soft and NT GU no vb EXt no calf tenderness Results for orders placed or performed during the hospital encounter of 03/11/23 (from the past 72 hours)  Urinalysis, Routine w reflex microscopic -Urine, Clean Catch     Status: Abnormal   Collection Time: 03/11/23 12:58 PM  Result Value Ref Range   Color, Urine AMBER (A) YELLOW    Comment: BIOCHEMICALS MAY BE AFFECTED BY COLOR   APPearance HAZY (A) CLEAR   Specific Gravity, Urine 1.030 1.005 - 1.030   pH 5.0 5.0 - 8.0   Glucose, UA NEGATIVE NEGATIVE mg/dL   Hgb urine dipstick MODERATE (A) NEGATIVE   Bilirubin Urine NEGATIVE NEGATIVE   Ketones, ur 5 (A) NEGATIVE mg/dL   Protein, ur 899 (A) NEGATIVE mg/dL   Nitrite NEGATIVE NEGATIVE   Leukocytes,Ua MODERATE (A) NEGATIVE   RBC / HPF 0-5 0 - 5 RBC/hpf   WBC, UA 21-50 0 - 5 WBC/hpf   Bacteria, UA NONE SEEN NONE SEEN   Squamous Epithelial / HPF 0-5 0 - 5 /HPF   Mucus PRESENT     Comment: Performed at [facility], [address] [place], [place] [address]  Rapid urine drug screen (hospital performed) Not at [facility]     Status: Abnormal   Collection Time: 03/11/23 12:58 PM  Result Value Ref Range   Opiates NONE DETECTED NONE DETECTED   Cocaine NONE DETECTED NONE DETECTED   Benzodiazepines NONE DETECTED NONE DETECTED   Amphetamines NONE DETECTED NONE DETECTED   Tetrahydrocannabinol POSITIVE (A) NONE DETECTED   Barbiturates NONE DETECTED NONE DETECTED    Comment: (NOTE) DRUG SCREEN FOR MEDICAL PURPOSES ONLY.  IF CONFIRMATION IS NEEDED FOR ANY PURPOSE, NOTIFY LAB WITHIN 5 DAYS.  LOWEST DETECTABLE LIMITS FOR URINE  DRUG SCREEN Drug Class                     Cutoff (ng/mL) Amphetamine and metabolites    1000 Barbiturate and metabolites    200 Benzodiazepine                 200 Opiates and metabolites        300 Cocaine and metabolites        300 THC                            50 Performed at [facility], [address], [place], [place] [address]   Wet prep, genital     Status: Abnormal   Collection Time: 03/11/23  1:36 PM  Result Value Ref Range   Yeast Wet Prep HPF POC NONE SEEN NONE SEEN   Trich, Wet Prep NONE SEEN NONE SEEN   Clue Cells Wet Prep HPF POC PRESENT (A) NONE SEEN   WBC, Wet Prep HPF POC >=10 (A) <10   Sperm NONE SEEN     Comment: Performed at [facility], [address]  [place], [place] [address]  CBC with Differential/Platelet     Status: Abnormal   Collection Time: 03/11/23  1:44 PM  Result Value Ref Range   WBC 12.2 (H) 4.0 - 10.5 K/uL   RBC 3.05 (L) 3.87 - 5.11 MIL/uL   Hemoglobin 9.0 (L) 12.0 - 15.0 g/dL   HCT 73.5 (L) 63.9 - 53.9 %   MCV 86.6 80.0 - 100.0 fL   MCH 29.5 26.0 - 34.0 pg   MCHC 34.1 30.0 - 36.0 g/dL   RDW 87.6 88.4 - 84.4 %   Platelets 281 150 - 400 K/uL   nRBC 0.0 0.0 - 0.2 %   Neutrophils Relative % 84 %   Neutro Abs 10.3 (H) 1.7 - 7.7 K/uL   Lymphocytes Relative 11 %   Lymphs Abs 1.3 0.7 - 4.0 K/uL   Monocytes Relative 4 %   Monocytes Absolute 0.5 0.1 - 1.0 K/uL   Eosinophils Relative 0 %   Eosinophils Absolute 0.0 0.0 - 0.5 K/uL   Basophils Relative 0 %   Basophils Absolute 0.0 0.0 - 0.1 K/uL   Immature Granulocytes 1 %   Abs Immature Granulocytes 0.09 (H) 0.00 - 0.07 K/uL    Comment: Performed at [facility], [address], [place], [place] [address]  Type and screen [facility]     Status: None   Collection Time: 03/11/23  1:44 PM  Result Value Ref Range   ABO/RH(D) O POS    Antibody Screen NEG    Sample Expiration      03/14/2023,2359 Performed at [facility], [address],  [place], [place] [address]   Culture, MA[place] Urine     Status: None   Collection Time: 03/11/23  3:12 PM   Specimen: Urine, Random  Result Value Ref Range   Specimen Description URINE, RANDOM    Special Requests NONE    Culture      NO GROWTH Performed at [facility], [address], [place], [place] [address]    Report Status 03/12/2023 FINAL   Kleihauer-Betke stain     Status: None   Collection Time: 03/11/23  4:43 PM  Result Value Ref Range   Fetal Cells % 0 %   Quantitation Fetal Hemoglobin 0.000 mL    Comment: up to 15mL   # Vials RhIg NOT INDICATED     Comment: Performed at [facility], [address] [place], [place] [address]  CBC     Status: Abnormal   Collection Time: 03/12/23  4:56 AM  Result Value Ref Range   WBC 11.3 (H) 4.0 - 10.5 K/uL   RBC 2.86 (L) 3.87 - 5.11 MIL/uL   Hemoglobin 8.3 (L) 12.0 - 15.0 g/dL   HCT 75.1 (L) 63.9 - 53.9 %   MCV 86.7 80.0 - 100.0 fL   MCH 29.0 26.0 - 34.0 pg   MCHC 33.5 30.0 - 36.0 g/dL   RDW 87.5 88.4 - 84.4 %   Platelets 263 150 - 400 K/uL   nRBC 0.0 0.0 - 0.2 %    Comment: Performed at [facility], [address], [place], [place] [address]    Assessment and Plan [redacted]w[redacted]d Clinical presentation c/w chronic abruption.   HGB is stable Appreciate MFM and NICU consultus Pt desires no intervention until 23 weeks Currently if she has fever or hemorrhage will proceed with IOL Pt understands that at 23 weeks she may need a classical CS which could  cause major blood loss and still have a poor outcome She still desires to proceed Latency abx started Pt transferred to L&D due to contractions.  Curretly will give supportive care and not stop labor if it occurs Pt understands the plan of care  Patient ID: Christy Gonzalez, female   DOB: 1995/12/14, 28 y.o.   MRN: [id number]

## 2023-03-12 NOTE — Consult Note (Signed)
 Consultation Service: Neonatology   Dr. Ovid All has asked for consultation on Christy Gonzalez regarding the care of a premature infant at [redacted]w[redacted]d. Thank you for inviting us  to see this patient.   Reason for consult:  Explain the possible complications, the prognosis, and the care of a premature infant at 73 and 6/7 weeks.  Chief complaint: 28 y.o. female with a singleton female IUP named Christy Gonzalez with an estimated weight of 541g grams. Pregnancy has been complicated by  PPROM and chronic placental abruption .  Plan is for delivery via ceasarean delivery if possible.  My key findings of this patient's HPI are:  I have reviewed the patient's chart and have met with her. The salient information is as follows:   Mom is admitted to [facility] specialty care for PPROM and monitoring of her placental abruption. Monitoring for blood loss, progressive labor, and fetal/maternal distress with no concerns at this time (mom reports occasional contractions and was 20% effaced at last spec). On latency antibiotics and OB/MFM team is wondering about possible maternal transfer if she desires resuscitation at 22 weeks.  Prenatal labs:   Prenatal care:   good Pregnancy complications:  PPROM and chronic abruption Maternal antibiotics: This patient's mother is not on file. Maternal Steroids: none Most recent dose:  N/a   My recommendations for this patient and my actions included:   1. In the presence of the [place], I spent 20 minutes discussing the possible complications and outcomes of prematurity at this gestational age. I discussed specific complications at this gestational age referencing the need for resuscitation at birth due to respiratory distress which may require mechanical ventilation, CPAP, and surfactant administration. In addition infant may require IV fluids pending establishment of enteral feeds (encouraged breast milk feeding), antibiotics for possible sepsis, temperature support,  and continuous monitoring. I also discussed the potential risk of complications such as intracranial hemorrhage, retinopathy, hearing deficit, and chronic lung disease. I discussed this with parents in detail and they expressed an understanding of the risks and complications of prematurity.   2. I also discussed the expected survival of an infant born at 22 weeks, which is (Poor). We further discussed that (Some) of the neonates born at this age have profound or severe neurological complications and school difficulties. In addition, (Most) of the neonates born at this age will have some for of mild to moderate neurological complications. She expressed an understanding of this information.   3. Mom has decided that she does NOT want to proceed with maternal transfer to a facility that could offer resuscitation at 22 weeks. She is hoping that she will make it to 23 weeks and would therefore like our NICU team to attempt resuscitation at that time. However, if she were to deliver prior to 22 weeks, she is planning on only comfort measures as she understands that resuscitation is not offered prior to 23 weeks here at [facility]. She also understood that our team will always be available for any questions that come up during their infant's hospitalization and we will continue to partner with their family to support them through this difficult time. Visitation policy was discussed and all questions were addressed.   Final Impression:  28 y.o. female with a singleton female IUP named Christy Gonzalez who is threatening to deliver and who now understands the possible complications and prognosis of her infant. The mother does NOT wish to proceed with maternal transfer for resuscitation prior to 23 weeks. She understands that if  she were to deliver prior to 23 weeks her infant would not survive. We recommend starting BMZ at 22 and 5 so that infant would be steroid mature at [redacted]w[redacted]d. Christy Gonzalez's questions were answered. She  is planning to try and provide breast milk for her infant.    ______________________________________________________________________  Thank you for asking us  to participate in the care of this patient. Please do not hesitate to contact us  again if you are aware of any further ways we can be of assistance.   Sincerely,  Lonni FABIENE Donald, MD Attending Neonatologist   I spent ~35 minutes in consultation time, of which 15 minutes was spent in direct face to face counseling.

## 2023-03-13 ENCOUNTER — Inpatient Hospital Stay (HOSPITAL_COMMUNITY): Payer: Medicaid Other | Admitting: Anesthesiology

## 2023-03-13 LAB — CBC WITH DIFFERENTIAL/PLATELET
Abs Immature Granulocytes: 0.41 10*3/uL — ABNORMAL HIGH (ref 0.00–0.07)
Basophils Absolute: 0.1 10*3/uL (ref 0.0–0.1)
Basophils Relative: 0 %
Eosinophils Absolute: 0 10*3/uL (ref 0.0–0.5)
Eosinophils Relative: 0 %
HCT: 26 % — ABNORMAL LOW (ref 36.0–46.0)
Hemoglobin: 8.7 g/dL — ABNORMAL LOW (ref 12.0–15.0)
Immature Granulocytes: 3 %
Lymphocytes Relative: 9 %
Lymphs Abs: 1.4 10*3/uL (ref 0.7–4.0)
MCH: 29.2 pg (ref 26.0–34.0)
MCHC: 33.5 g/dL (ref 30.0–36.0)
MCV: 87.2 fL (ref 80.0–100.0)
Monocytes Absolute: 1.2 10*3/uL — ABNORMAL HIGH (ref 0.1–1.0)
Monocytes Relative: 8 %
Neutro Abs: 12.6 10*3/uL — ABNORMAL HIGH (ref 1.7–7.7)
Neutrophils Relative %: 80 %
Platelets: 261 10*3/uL (ref 150–400)
RBC: 2.98 MIL/uL — ABNORMAL LOW (ref 3.87–5.11)
RDW: 12.5 % (ref 11.5–15.5)
WBC: 15.6 10*3/uL — ABNORMAL HIGH (ref 4.0–10.5)
nRBC: 0 % (ref 0.0–0.2)

## 2023-03-13 LAB — GC/CHLAMYDIA PROBE AMP (~~LOC~~) NOT AT ARMC
Chlamydia: NEGATIVE
Comment: NEGATIVE
Comment: NORMAL
Neisseria Gonorrhea: NEGATIVE

## 2023-03-13 MED ORDER — DIPHENHYDRAMINE HCL 50 MG/ML IJ SOLN: 12.5000 mg | INTRAMUSCULAR | Status: DC | PRN

## 2023-03-13 MED ORDER — FENTANYL CITRATE (PF) 100 MCG/2ML IJ SOLN
100.0000 ug | INTRAMUSCULAR | Status: DC | PRN
  Administered 2023-03-13 (×3): 100 ug via INTRAVENOUS
  Filled 2023-03-13 (×3): qty 2

## 2023-03-13 MED ORDER — SOD CITRATE-CITRIC ACID 500-334 MG/5ML PO SOLN: 30.0000 mL | ORAL | Status: DC | PRN

## 2023-03-13 MED ORDER — FENTANYL-BUPIVACAINE-NACL 0.5-0.125-0.9 MG/250ML-% EP SOLN
12.0000 mL/h | EPIDURAL | Status: DC | PRN
  Administered 2023-03-13: 12 mL/h via EPIDURAL
  Filled 2023-03-13: qty 250

## 2023-03-13 MED ORDER — BETAMETHASONE SOD PHOS & ACET 6 (3-3) MG/ML IJ SUSP
12.0000 mg | Freq: Once | INTRAMUSCULAR | Status: AC
  Administered 2023-03-13: 12 mg via INTRAMUSCULAR
  Filled 2023-03-13: qty 5

## 2023-03-13 MED ORDER — SODIUM CHLORIDE 0.9 % IV SOLN
12.5000 mg | Freq: Once | INTRAVENOUS | Status: AC
  Administered 2023-03-13: 12.5 mg via INTRAVENOUS
  Filled 2023-03-13: qty 0.5

## 2023-03-13 MED ORDER — LIDOCAINE HCL (PF) 1 % IJ SOLN
INTRAMUSCULAR | Status: DC | PRN
  Administered 2023-03-13 (×2): 4 mL via EPIDURAL

## 2023-03-13 MED ORDER — OXYTOCIN BOLUS FROM INFUSION: 333.0000 mL | Freq: Once | INTRAVENOUS | Status: DC

## 2023-03-13 MED ORDER — ONDANSETRON HCL 4 MG/2ML IJ SOLN: 4.0000 mg | Freq: Four times a day (QID) | INTRAMUSCULAR | Status: DC | PRN

## 2023-03-13 MED ORDER — LIDOCAINE HCL (PF) 1 % IJ SOLN: 30.0000 mL | INTRAMUSCULAR | Status: DC | PRN

## 2023-03-13 MED ORDER — PHENYLEPHRINE 80 MCG/ML (10ML) SYRINGE FOR IV PUSH (FOR BLOOD PRESSURE SUPPORT): 80.0000 ug | PREFILLED_SYRINGE | INTRAVENOUS | Status: DC | PRN

## 2023-03-13 MED ORDER — PROMETHAZINE HCL 6.25 MG/5ML PO SOLN: 12.5000 mg | Freq: Four times a day (QID) | ORAL | Status: DC | PRN

## 2023-03-13 MED ORDER — OXYCODONE-ACETAMINOPHEN 5-325 MG PO TABS: 1.0000 | ORAL_TABLET | ORAL | Status: DC | PRN

## 2023-03-13 MED ORDER — EPHEDRINE 5 MG/ML INJ: 10.0000 mg | INTRAVENOUS | Status: DC | PRN

## 2023-03-13 MED ORDER — OXYTOCIN-SODIUM CHLORIDE 30-0.9 UT/500ML-% IV SOLN: 2.5000 [IU]/h | INTRAVENOUS | Status: DC

## 2023-03-13 MED ORDER — OXYCODONE-ACETAMINOPHEN 5-325 MG PO TABS: 2.0000 | ORAL_TABLET | ORAL | Status: DC | PRN

## 2023-03-13 MED ORDER — LACTATED RINGERS IV SOLN: 500.0000 mL | Freq: Once | INTRAVENOUS | Status: DC

## 2023-03-13 MED ORDER — NIFEDIPINE 10 MG PO CAPS
20.0000 mg | ORAL_CAPSULE | Freq: Once | ORAL | Status: AC
  Administered 2023-03-13: 20 mg via ORAL
  Filled 2023-03-13: qty 2

## 2023-03-13 MED ORDER — EPHEDRINE 5 MG/ML INJ
10.0000 mg | INTRAVENOUS | Status: DC | PRN
Start: 2023-03-13 — End: 2023-03-13

## 2023-03-13 NOTE — Anesthesia Procedure Notes (Signed)
 Epidural Patient location during procedure: OB Start time: 03/13/2023 8:04 AM End time: 03/13/2023 8:09 AM  Staffing Anesthesiologist: Peggye Delon Brunswick, MD Performed: anesthesiologist   Preanesthetic Checklist Completed: patient identified, IV checked, site marked, risks and benefits discussed, surgical consent, monitors and equipment checked, pre-op evaluation and timeout performed  Epidural Patient position: sitting Prep: DuraPrep and site prepped and draped Patient monitoring: continuous pulse ox and blood pressure Approach: midline Location: L3-L4 Injection technique: LOR saline  Needle:  Needle type: Tuohy  Needle gauge: 17 G Needle length: 9 cm and 9 Needle insertion depth: 4 cm Catheter type: closed end flexible Catheter size: 19 Gauge Catheter at skin depth: 8 cm Test dose: negative  Assessment Events: blood not aspirated, no cerebrospinal fluid, injection not painful, no injection resistance, no paresthesia and negative IV test  Additional Notes The patient has requested an epidural for labor pain management. Risks and benefits including, but not limited to, infection, bleeding, local anesthetic toxicity, headache, hypotension, back pain, block failure, etc. were discussed with the patient. The patient expressed understanding and consented to the procedure. I confirmed that the patient has no bleeding disorders and is not taking blood thinners. I confirmed the patient's last platelet count with the nurse. A time-out was performed immediately prior to the procedure. Please see nursing documentation for vital signs. Sterile technique was used throughout the whole procedure. Once LOR achieved, the epidural catheter threaded easily without resistance. Aspiration of the catheter was negative for blood and CSF. The epidural was dosed slowly and an infusion was started.  1 attempt(s)Reason for block:procedure for pain

## 2023-03-13 NOTE — Anesthesia Preprocedure Evaluation (Signed)
 Anesthesia Evaluation  Patient identified by MRN, date of birth, ID band Patient awake    Reviewed: Allergy & Precautions, NPO status , Patient's Chart, lab work & pertinent test results  History of Anesthesia Complications Negative for: history of anesthetic complications  Airway Mallampati: II  TM Distance: >3 FB Neck ROM: Full    Dental   Pulmonary former smoker   Pulmonary exam normal breath sounds clear to auscultation       Cardiovascular negative cardio ROS  Rhythm:Regular Rate:Normal     Neuro/Psych negative neurological ROS     GI/Hepatic Neg liver ROS,GERD  Medicated,,  Endo/Other  negative endocrine ROS    Renal/GU negative Renal ROS     Musculoskeletal   Abdominal   Peds  Hematology  (+) Blood dyscrasia, anemia Lab Results      Component                Value               Date                      WBC                      15.6 (H)            03/13/2023                HGB                      8.7 (L)             03/13/2023                HCT                      26.0 (L)            03/13/2023                MCV                      87.2                03/13/2023                PLT                      261                 03/13/2023              Anesthesia Other Findings   Reproductive/Obstetrics (+) Pregnancy                              Anesthesia Physical Anesthesia Plan  ASA: 2  Anesthesia Plan: Epidural   Post-op Pain Management:    Induction:   PONV Risk Score and Plan:   Airway Management Planned: Natural Airway  Additional Equipment:   Intra-op Plan:   Post-operative Plan:   Informed Consent: I have reviewed the patients History and Physical, chart, labs and discussed the procedure including the risks, benefits and alternatives for the proposed anesthesia with the patient or authorized representative who has indicated his/her understanding and  acceptance.       Plan Discussed with: Anesthesiologist  Anesthesia Plan Comments: (I have discussed risks of  neuraxial anesthesia including but not limited to infection, bleeding, nerve injury, back pain, headache, seizures, and failure of block. Patient denies bleeding disorders and is not currently anticoagulated. Labs have been reviewed. Risks and benefits discussed. All patient's questions answered.  )         Anesthesia Quick Evaluation

## 2023-03-13 NOTE — H&P (Signed)
 ------------------------------------------------------------------------------- Attestation with edits by Harlene Rosina Poisson, MD at 03/13/2023  9:24 PM With the fellow, I saw and evaluated the patient, reviewed the medical record, provided counseling, and discussed management. I agree with the documentation below, edited as appropriate.  Briefly, this is a 28yo [id number] at [redacted]w[redacted]d transfer from OSH for higher level NICU care in s/o PPROM (1/4), suspected chronic abruption, and tPTL (cervical change from FT to 4cm, now stable on rpt x2), transferred on latency abx/tocolysis/BMZ, s/p MFM/NICU at OSH. Hx notable for 3 prior uncomplicated term SVDs, recently dx BV (on Flagyl ), anemia (baseline Hgb 10), THC/tobacco use in pregnancy, and PNC at [facility] (records unavailable, per transferring OSH, dated by LMP and early US ). Adm SSE/SVE confirming PPROM w/o bleeding or cervical change (4/50/-2), breech fetus on BSUS, VS wnl, intermittent ctx, Hgb ~8, coags pending. OSH US  (1/5): posterior placenta, EFW 541g 90%. S/p MFM and NICU counseling on arrival.  Pt extensively counseled at bedside. Reviewed clinical course and unpredictable nature of PTL, acknowledging that PPROM and suspected chronic abruption increase risk for continued PTL (including precipitous progression), infection, and bleeding. Explicitly reviewed classical CS, associated increased surgical morbidity, and implications for future pregnancies; given the known increased maternal morbidity w/ uncertain periviable fetal benefit, delivery via CS <23w is generally not advised. Further, discussed that EFM at such an early GA can be challenging to maintain continuous tracing and has risk for indeterminate FHR patterns and possible false positives. Pt expressed understanding of all these risks and also understands the prognosis for neonates born at the extremes of viability (including both survival and sequelae of prematurity in surviving neonates).  Reaffirms desire for a live birth and aggressive neonatal resuscitation; expresses desire for prenatal fetal optimization (via BMZ, Mg, and abx) as well as intervention in the s/o labor progression or fetal distress, including classical CS. All questions answered.  Christy A. Poisson, MD Maternal Fetal Medicine / Obstetrics and Gynecology [facility]  -------------------------------------------------------------------------------  MFM Fellow Attestation:  Christy Gonzalez is a 28 y.o. [phone] at [redacted]w[redacted]d admitted as a [facility] from [facility] w/ PPROM and tPTL, suspected chronic abruption. Patient history reviewed as below. Patient requested transport in the setting of now strong desire for aggressive neonatal intervention and [facility] unable to resuscitate at this early GA. Providers at [facility] also discussed need for classical c-section in the setting of fetal malpresentation in the event of desire for resuscitation at her current GA, which patient strongly desired, including for fetal indications. Following discussion with [facility] NICU and OB/MFM providers, decision made for transport to [facility]. Prior to transport, patient given dose of Procardia  for tocolysis and BMTZ. Her cervical exam remained stable x2 checks at 4cm at [facility] as well prior to transport. On arrival here, patient PPROM reconfirmed and cervical exam stable at 4cm. On bedside ultrasound, fetus noted to be breech presentation. Continuing to have intermittent contractions without abdominal tenderness. She remains afebrile at this time. NICU presented to bedside for counseling. Following thorough counseling by their team with known recent EFW as of 1/5 541g (98%), patient reported continued desire for full intervention. We reviewed breech presentation with patient, including potential difficulty of breech deliveries in the event of labor progression given risk of head entrapment, as well as increased risk of morbidity and mortality with classical  c-section. Patient expressed [facility] providers had already discussed this procedure with her and that she desires to do everything possible to give her fetus the greatest chance at live birth  and resuscitation. We discussed classical c-section in depth including possible need for fundal incision given very small uterine size at this early gestational age. With this, we also discussed increased risk from baseline of bleeding, infection, and damage to surrounding structures/organs especially in the event of emergent delivery. We reviewed the risks associated with prior classical c-section in future pregnancies including risk of uterine rupture and need for earlier delivery. Patient reports strong desire for no further pregnancies following this and is interested in tubal ligation. Following shared decision making with patient after this thorough discussion, patient desires classical c-section if labor were to progress in the setting of breech fetal presentation given as well as for fetal distress on monitoring. We also discussed that given small fetus, precipitous vaginal delivery of breech fetus may also be a risk and in this event, we could still do everything we could to alert NICU at time of delivery for resuscitation. Patient expressed understanding. We reviewed the limitations of fetal monitoring at [redacted] weeks GA and the possibility of difficulty tracing. Patient expressed clear understanding of our discussion and confirms her aforementioned preferences.  I saw and evaluated the patient with the resident physician, Dr. Clayton. I have reviewed the note for completion and accuracy and made edits as appropriate. I agree with the assessment and plan as below.   Christy Pipes, MD  Maternal Fetal Medicine Fellow Department of Obstetrics and Gynecology [facility]   History & Physical   Service: Obstetrics  Chief Complaint: PPROM, PTL   Clinic: Tx from [facility]   Diagnoses: Intrauterine  pregnancy PPROM  PTL  Chronic abruption Right ovarian cyst  Tobacco Use in Pregnancy  THC Use in Pregnancy  Rubella non immune   Gestational Age: [redacted]w[redacted]d Dating Criteria: LMP   Assessment & Plan    Christy Gonzalez is a 28 y.o. G4P0003 being treated for:    #PPROM/PTL #Vaginal Bleeding, suspected chronic abruption #Bacterial vaginosis - Back and pelvic pain in pregnancy since 19 weeks >> multiple presentations to [facility].  - Adm at [facility] on 03/11/23 with back/abdominal pain and new onset vaginal bleeding, contracting w/ SVE FT/L/H, US  w/ breech presentation and normal AFI, Utox +THC. Subsequently diagnosed with BV and started on Flagyl  500 mg BID x 7 days. On HD#2, noted to have pink-tinged watery discharge (visualized pooling on SSE, no nitrazine/ferning/Amnisure performed) and new oligo (AFI <5). Started on latency abx for presumed PPROM (1/5) and epidural for pain mgmt. At that time, pt counseled by OB and NICU - declined transfer to [facility], electing for no resuscitation until 23w. >> On HD#3 (1/6), increased back pain and lower abdominal cramping, repeat SVE: 4/80/0. Pt now strongly desiring full resuscitation, including fetal monitoring and CS for fetal benefit, thus transfer to [facility] initiated. Received Procardia  tocolysis and BMZ #1 at OSH prior to transfer. - In [facility] OB Triage, continues to report intermittent painful contractions. +FHR, VS wnl. No fundal tenderness. - Adm wet prep neg (including neg for BV), Hgb 8.4, O+ - SSE: no bleeding, +nitrazine/pooling/ferning - SVE overall stable at 4/50/-2 and fetal breech palpable. - NICU consulted urgently on arrival; OB anesthesia aware. ASSESSMENT/PLAN: PPROM and tPTL - See above for MFM counseling attestations - Delivery consents signed - Mg for fetal neuroprotection ordered - BMZ#2 1/7 - cEFM/TOCO for now - f/u coags - Continued on latency antibiotics (s/p azithromycin  and IV ampicllin x 4 doses) given stable SVE on adm, will  switch to labor GBS ppx if cervical  change  - No indication for tocolysis given PPROM  #Anemia: -Baseline Hgb 10.3, MCV 88.2 (mid-December) -Adm Hgb 8.4 (from 9 at OSH pre-transfer) -f/u coags -No current bleeding  #Right Ovarian Cyst: OSH US  - simple 3cm cyst #THC/Tobacco Use in Pregnancy: cessation encouraged, NRT prn #Rubella non immune: Offer vaccination PP   #Fetal Wellbeing  -Fetal Monitoring: Continuous   -Steroids: BMZ 1/6-7 -NICU Consult: Ordered, RN charge made aware -Group B Streptococcus: Deferred given recent administration of antibiotics at OSH -Presentation: Breech by bedside US  -PNC at [facility], dated by LMP and early US  (unable to see report), f/u records in AM (office currently closed)   #Maternal Considerations -Blood Type: O Positive -DVT Prophylaxis: SCD   #Postpartum Planning -Infant feeding: Not addressed this encounter -Contraception: strongly desires bilateral salpingectomy   #Disposition: Admit to L&D for expectant management   Subjective    Christy Gonzalez presents as a transfer from [facility] for higher level NICU care in s/o tPTL and PPROM, admitted at OSH since 1/4.    OB Review of Systems: Fetal movement: Active Vaginal bleeding: Not currently Loss of fluid: Yes Contractions: Yes   Preeclampsia Symptoms Review: Headaches: No Vision changes: No Chest pain and/or shortness of breath: No RUQ pain: No  Review of Systems: A complete review of systems was performed with pertinent positives as above.   Objective   Temp:  [98.3 F (36.8 C)] 98.3 F (36.8 C) Heart Rate:  [98] 98 BP: (109)/(62) 109/62   Physical Exam: GENERAL: Alert, intermittently uncomfortable appearing, no acute distress CHEST: No increased work of breathing CV: Regular rate ABDOMEN: Gravid, soft, nontender EXTREMITIES:  Warm and well-perfused, nontender, nonedematous NEURO: CN II-XII grossly intact  SVE: 4/ 50/ -2 (unchanged from pre-transfer  exam) SSE: no active bleeding, positive nitrazine, pooling and ferning   Obstetric ultrasounds reviewed in Epic. 1/5 US : breech, posterior placenta, oligo, EFW 541g 90%, CL 3.6cm  Lab results and other imaging reviewed in Epic.  History   OB History  Gravida Para Term Preterm AB Living  4 3 0 0 0 3  SAB IAB Ectopic Molar Multiple Live Births           # Outcome Date GA Lbr Len/2nd Weight Sex Type Anes PTL Lv  4 Current           3 Para           2 Para           1 Para            Past Medical History:  Diagnosis Date  . Anemia   . Ovarian cyst    Right   History reviewed. No pertinent surgical history.  Coding for today's visit was reached by Medical Decision Making (MDM).  Christy Lavern Radar, MD Obstetrics & Gynecology, PGY3

## 2023-03-13 NOTE — Consults (Signed)
 Neonatology Prenatal Consult   Christy Gonzalez is a 28 y.o. O+ [id number] female with negative serologies presenting with singleton female at [redacted]w[redacted]d gestation (EFW 54g) complicated by PTL with PPROM and concern for placental abruption on latency abx now s/p BMZ x 1 at 11 am. She has received a full prenatal consult prior to transfer from [facility].    She presents today without a support person but reports that she would call someone to be present for delivery. She intends to name her daughter Christy Gonzalez and desires a full trial of resuscitation including intubation, chest compressions, IV medications, needle decompression. We briefly reviewed chances of survival and moderate to severe neurodevelopmental impairment. We also briefly discussed members/roles of NICU team, initial resuscitation, and short-term NICU management including intubation, surfactant, UVC/UAC, antibiotics, risk of IVH, ROP, lung disease, hyperbili, FEN, thermoregulatory support.  Discharge criteria, anticipated LOS, and most recent NICU visitor policy were also discussed. Please call [phone] for any additional questions.   Harrie Larae Friar, MD Mon 03/13/2023    Attending Attestation: I was the attending at the time of this consult, discussed the maternal/prenatal history with the fellow Dr. Friar, reviewed the medical record, and agree with topics discussed for this 22 weeks pregnancy complicated by concerns for PTL, PPROM and concern for placental abruption. Mother wants full trial of  resuscitation at this time   Time spent on consult: 35 minutes  Electronically signed by: Woody Rory Hun, MD 03/13/2023 6:26 PM

## 2023-03-13 NOTE — Progress Notes (Addendum)
 Christy Gonzalez is a 28 y.o. [phone] at [redacted]w[redacted]d admitted for vaginal bleeding, likely due to chronic placenta abruption, also with PPROM,    Subjective:  Patient feels comfortable with epidural. Patient is wondering if newborn resuscitation will be done if baby was to be born soon.    Objective: BP (!) 103/53   Pulse 81   Temp 98.9 F (37.2 C) (Oral)   Resp 14   Ht 5' 4 (1.626 m)   Wt 60.5 kg   LMP 10/15/2022   SpO2 98%   BMI 22.90 kg/m  I/O last 3 completed shifts: In: -  Out: 1105 [Urine:1100; Blood:5] No intake/output data recorded.  FHT:   145 By dopplers.  UC:   irregular, every 3 to 5 minutes SVE:   Dilation: 4 Effacement (%): 80 Station: 0, Breech.  Exam by:: Christy Foil, MD.   Labs: Lab Results  Component Value Date   WBC 15.6 (H) 03/13/2023   HGB 8.7 (L) 03/13/2023   HCT 26.0 (L) 03/13/2023   MCV 87.2 03/13/2023   PLT 261 03/13/2023    Assessment / Plan: 28 y.o. G4P3003 at [redacted]w[redacted]d admitted for vaginal bleeding, likely due to chronic placenta abruption, also with PROM, with breech presentation,  Labor:  Preterm labor. Patient is s/p NICU and MFM consultation and plan is for fetal resuscitation at [redacted] weeks EGA or more if stays at [facility].    Patient now desires transfer to [facility] so as to have the option of newborn resuscitation at [redacted] weeks EGA and beyond. I made call to [facility] and spoke to Dr. Merle through the transfer service, they will accept transfer of the patient.  Her cervix was reasessed about 1 hour later and before transfer she was unchanged.  SHe received oral procardia  20 mg PO x 1 dose and IM Betamethasone  before transfer. Her contractions spaced out.  I discussed with patient risks, benefits and alternatives of transfer to [facility] including risks of delivery en route, in which case fetal resuscitation would not be possible en route.   She expressed full understanding of all and desired to proceed with  transfer.   Preeclampsia:   None. Fetal Wellbeing:   N/A. Pain Control:  Epidural catheter to be removed before transfer. I/D:   On latency antibiotics.  Anticipated MOD:  Unsure.   Christy LELON Foil, MD 03/13/2023, 11:32 AM.

## 2023-03-13 NOTE — Anesthesia Postprocedure Evaluation (Signed)
 Anesthesia Post Note  Patient: Christy Gonzalez  Procedure(s) Performed: AN AD HOC LABOR EPIDURAL     Patient location during evaluation: L&D Anesthesia Type: Epidural Level of consciousness: awake Pain management: pain level controlled Vital Signs Assessment: post-procedure vital signs reviewed and stable Respiratory status: spontaneous breathing, nonlabored ventilation and respiratory function stable Cardiovascular status: stable Postop Assessment: no headache and no backache Anesthetic complications: no   No notable events documented.  Last Vitals:  Vitals:   03/13/23 1101 03/13/23 1131  BP: 117/75 (!) 112/58  Pulse: 85 (!) 101  Resp:    Temp:    SpO2:      Last Pain:  Vitals:   03/13/23 0931  TempSrc:   PainSc: 0-No pain                 Delon Aisha Arch

## 2023-03-14 ENCOUNTER — Other Ambulatory Visit: Payer: Self-pay | Admitting: Obstetrics and Gynecology

## 2023-03-14 DIAGNOSIS — Z363 Encounter for antenatal screening for malformations: Secondary | ICD-10-CM

## 2023-03-14 DIAGNOSIS — O4290 Premature rupture of membranes, unspecified as to length of time between rupture and onset of labor, unspecified weeks of gestation: Secondary | ICD-10-CM

## 2023-03-14 DIAGNOSIS — O4392 Unspecified placental disorder, second trimester: Secondary | ICD-10-CM

## 2023-03-14 NOTE — Procedures (Signed)
-------------------------------------------------------------------------------   Attestation with edits by Harlene Rosina Poisson, MD at 03/14/2023  9:14 PM I reviewed the maternal-fetal tracing and agree with the documentation below, edited as appropriate.  Jessica A. Poisson, MD Maternal Fetal Medicine / Obstetrics and Gynecology [facility]  -------------------------------------------------------------------------------  Non Stress Test Procedure Note  Patient: Christy Gonzalez Gestational Age: [redacted]w[redacted]d Date: 03/14/2023  Indication: Periviable PPROM   Prolonged FHT: Baseline HR 140, moderate variability, occasional accelerations, occasional variable decels and isolated spontaneous decel  Tocometer: irritability  Impression: Reassuring for gestational age  50. Lavern Radar, MD Obstetrics & Gynecology, PGY3

## 2023-03-14 NOTE — Progress Notes (Signed)
-------------------------------------------------------------------------------   Attestation signed by Eleanor Jama Everts, MD at 03/15/2023 12:34 AM I evaluated the patient and agree with the plan. See my separate plan of care note.  Eleanor FREDRIK Everts, MD Maternal Fetal Medicine Attending [facility]  -------------------------------------------------------------------------------  Cesarean Delivery Discussion  Christy Gonzalez [phone] at [redacted]w[redacted]d admitted to L&D on 03/13/2023 for PPROM, PTL, breech presentation.  SVE 5/90/-2. Fetal parts visible on speculum exam at external os.   Patient counseled on recommendation to proceed with cesarean delivery due to progression of preterm labor, breech presentation. Patient reiterated her desires for a classical cesarean section.   Discussed cesarean delivery including risk of infection, bleeding possibly requiring transfusion, infection, damage to surrounding structures (including, but not limited to bowel, bladder, ureters and baby), and <1% chance of a hysterectomy as a life saving procedure.  All questions answered and informed consent obtained, consents previously signed.  Patient does not desire tubal ligation at this time.  Surgical class II   Discussed with Dr. Everts; OR and anesthesia made aware.   Christy Greaves, MD PGY-2 Obstetrics and Gynecology Tue 03/14/2023 9:48 PM

## 2023-03-15 NOTE — Nursing Note (Addendum)
 Patient received to PACU with anesthesia resident and OR nurse. No complaints of pain or nausea at this time.  Anesthesia sign out @0138  with Dr. Debby on phone   Patient transferred via stretcher to postpartum unit #[id number] @0150  Detailed report given to Nyle, RN @0204  All questions answered.

## 2023-03-15 NOTE — Consults (Signed)
 [facility] Neonatal Perinatal Palliative Care Initial Maternal Inpatient Consultation Note Late Entry for 03/14/2023 at ~1600   NAME Christy Gonzalez MRN [id number] DOB 25-Apr-1995 DOS 03/14/2023   I saw patient Christy Gonzalez in maternal inpatient initial consultation at the request of Dr. Gretel for reason PPROM at 21.5 weeks and PTL and high risk of extreme premature delivery.   Christy Gonzalez is a 28 year-old woman with a singleton pregnancy at approximately 22.[redacted] weeks gestational age (EDD 07/17/2023) [id number] complicated by PPROM on 03/11/2023 and PTL, dilated to 4cm. Today, I met with Christy Gonzalez to discuss her potential to have an extremely premature infant and advanced planning for her daughter whom she is planning on naming either Na'myracle or Na'leah. This is a co counseling session with MFM Dr. Gretel.     The mother was able to share detailed information regarding her current pregnancy state:she recognizes that she has lost fluid and that her daughter is likely to deliver early and at this age would be unlikely to survive.  She remains saddened and hopeful that the fetus will survive to be resuscitated. She is also aware that she may live a shortened to long period of time post birth depending on the physical development, size, clinical presentation, and gestational age.    I discussed the implications of diagnosis extreme prematurity, possible variable prognosis, and long-term morbidities/mortality for the fetus born between 22-[redacted] weeks GA. I shared statistical findings from the NICHD for 22-[redacted] wks GA. We discussed at length viability if she was to deliver early in pregnancy (22-[redacted] wks GA).  We talked about the likelihood that when her daughter was born that she could potentially have moderate to severe neurological development problems due to her extreme prematurity (mental, learning delays, and CP) but that sometimes those issues are not discovered until the infant increases in age. We discussed long-term  developmental follow-up care to help identify any issues. Christy Gonzalez appears to have a clear picture of viability/morbidity/mortality statistics in regards to a premature infant. She was presented with stats from the NICHD database and seems to understand the magnitude of an early delivery. She knows and understands that her survival is dependent upon her size, presentation, and pulmonary function/efforts. She has received betamethasone  on 1/6 and is scheduled to received second dose 1/7 and is s/p magnesium sulfate and is receiving latency antibiotics.  EFW on a scan from [facility] was 541 grams ~90%.  Infant is breech presentation.     We discussed in detail what happens during delivery and initial period of resuscitation in these high-risk cases after [redacted] weeks GA. We discussed two different pathways, resuscitation versus comfort care. I explained what the delivery room (OR) might look like due to the special situation of her medical conditions/medical needs and the presence of the NICU team. I explained that there would be a large volume of personnel present at this special delivery.  It was explained that we might have to do things somewhat non-traditionally, but we would take care of them. We would accompany them on their journey and walk alongside her family.   Our conversation continued to fully describe what "Resuscitation" included: initial stabilization, initial assessment of size and physical appearance,  initial to advance respiratory support, intubation/CPAP/Oxygen, surfactant delivery, assessment of heartrate, chest compressions, epinephrine delivery, and umbilical line placement. Mother is fully aware and verbally understands that medically aggressive interventions can cause potential pain, suffering, only to shortly extend life.  She is hopeful that her daughter responds to intubation  and thermoregulation support and does not require chest compressions/meds/needle decompressions as she knows this would  portray a worse prognosis, though she would want those things tried.  Her ultimate goal is to give her daughter every chance at survival.  She is aware of the neurodevelopmental concerns of those infants born at this early [place].    She verbalized understanding that her daughter could be resuscitated and still not survive the initial period due to prematurity. She verbalized understanding that if the medical team has provided resuscitation care and her daughter does not respond accordingly that further open conversations will happen even to the point that the medical team will approach her and relay that she needs to be held and allowed to a peaceful death. It is my interpretation that this family can make the decision to withdrawal care if and when the time is appropriate to transition goals and needs.   We discussed the option of initiation of COMFORT Care at the time of delivery or post birth given the potential extreme prematurity diagnosis.     After discussing both approaches to care, Christy Gonzalez has requested that we provide FULL RESUSCITATION starting at 22.[redacted] wks GA.   We then discussed in detail what NICU life could look like for her infant.   If and when her daughter is admitted to the NICU, we discussed the possible need for umbilical lines/ peripheral IV placement for long term nutritional needs, frequent lab work collections, BP monitoring, medications, and blood product delivery. These being medical aggressive measures to attempt to sustain life.    We discussed the use of ventilator, CPAP, [place], and room air. That how we care for her would be dependent upon her presentation and attempts at spontaneous breathing. We discussed that babies born early may need additional support with ventilation and/or surfactant delivery to support the airsacs and airways. We discussed that prior to 25 weeks most infants require intubation and mechanical ventilation and remain intubated for weeks to months.  We  discussed that extremely premature infants sometimes need additional steroids to assist them to get off the ventilator and that would be discussed prior to use.  We discussed that some infant's lungs are so fragile that air leaks or pneumothoraces can occur and become an emergent procedure that requires needle aspirations and chest tubes. She expressed understanding. We discussed in detail how pulmonary hypoplasia might look and the common findings associated with infants with immature and under developed lungs. We discussed that despite all respiratory efforts and mechanical ventilation support most infants with pulmonary hypoplasia do not survive or if they survive that they will have severe long term medical complex issues that will change their family dynamics. We discussed that infant's with severe respiratory needs sometimes would possibly need a long-term ventilator to make it home and that would require a tracheostomy (a surgical procedure for an airway).    We discussed antibiotic use if there was due cause for use such as infection or clinical presentation. We discussed that her clinical picture (bloodwork, resp support and vital signs) would all play an intricate part in usage of ABX. That if she was started on ABX we would monitor is lab work and discontinue when appropriate.    We discussed the neurological issues of being born too early. I explained that infant born before 30 weeks or 1500gram receive a CUS to verify if the blood vessels in the head were stable or if bleeding has occurred. I explained that infants born too  soon have a higher chance of having an IVH due to the instability and immaturity of their autonomic systems. We discussed the grading of IVH from 0-4. We discussed that if she had a devastating bleed grade 4 bilaterally that we would talk with her about options of care and our recommendations. I explained that with our babies born little we take every precaution (IVH protocol) to  prevent IVH from happening but sometimes despite all interventions these babies still will have bleeding into their brains. I explained it is with these IVH that co morbidities accompany the outcomes (neurodevelopmental impairments).     Christy Gonzalez desires to breastfeed and desires to pump her milk. I explained that lactation consultants would be available to assist her. She understands that there is donor breast milk program available if the need should come. We discussed feeding difficulties, intolerances in prematurity, different modalities of feeding, the importance of her breastmilk, and the risk of NEC in infants. We discussed feeding CUES and that premature babies receive nutrition initial via IV/UVC/PICC support and work up on feedings slowly over time. We discussed when the feeding volume is enough to prompt growth that IV fluids will be discontinued. I explained that she will need to reach 32-[redacted] weeks GA and be on low enough respiratory support before oral feeding will be introduced. I explained the benefits of MBM and that we would highly suggest if she is born early that she consider pumping for her immunity and tolerance of food. The topic of possible future need of GTube placement was discussed and dependent upon the baby's functioning and coordination with feeding. We discussed the need to involved speech therapy, PT & OT dependent upon her ADLs and progression.    We discussed that family involvement and inclusion was very important to us . We discussed visitation and parent participation in family rounds. We discussed that [facility] can be present for family meetings and provide a consistency with communication with their baby's treatment and care. The mother liked this idea and our involvement. I explained that Skin to Skin care was our NICU culture and we would teach them to provide this excellent treatment. I explained the benefits of this care and why we high encourage families to  interact with their baby. She would love to be actively involved in this task.    Assessment and Plan with 8 domains of Palliative Care:   Christy Gonzalez is 22.[redacted] weeks pregnant after PPROM at 21.5 weeks and PTL with high risk of extreme premature delivery. The current goal of care expressed by the mother is FULL RESUSCITATION @ [redacted] wks GA and sustaining medical intervention therapies. This mother wishes to have the following medical interventions: fetal monitoring and C section on fetal behalf. I have advised her that the [facility] team will further counsel her regarding the risks associated with these interventions and make notations of her desires for the team.   8 Domains of Palliative Care   Domain1: Structure and Process of Care Mother wants medical interventions fetal monitoring and C-Section on fetal behalf (OB to counsel, mom desires csection at this time due to breech presentation to avoid head entrapment, MFM has counseled, she understands at this GA and size this would mean classical csection) [facility] team will disseminate the consultation note to all potentially involved providers to facilitate continuity of care Mother was given [facility] business card, ACP tool, and clinic brochure Follow up [facility] Palliative Care prn. Please contact PRN  via secure chat Clotilda Finder Family Behavioral Specialist consult Social Work consult/PRN (nursing to contact) Chaplaincy consult/PRN (nursing to contact)   Prognosis- Christy Gonzalez has a guarded outcome depending on GA, size at birth, clinical presentation, and respiratory development. Resuscitation post birth- FULL Resuscitation [redacted]wks GA with open communication Possible deterioration post birth- If Christy Gonzalez survives the initial resuscitation period and is undergoing care in the NICU but her condition deteriorates and the medical team does not think survival is likely, multiple painful aggressive procedures must be done without a chance  of long-term survival, then the parents would like open communication to discuss potential transitioning of care goals.   Domain 2: Physical Aspects of Care NICU team delivery for comprehensive care [facility] notify delivery via secure chat   Delivery: NICU Comprehensive Care @ delivery FULL RESUSCITATION @ [redacted]wks GA   If concerns for Imminent Death Arise: Attention to control symptoms and pain (contact central pharmacy [phone] for Morphine and Versed prn) Focus on time and quality of life Skin to Skin contact asap  Cuddle cot to be used if desired [facility] Bereavement protocol initiated Consult SW, Chaplaincy, and [employer] Specialist (nursing to contact)   Domain 3: Psychological and Psychiatric Aspects: No concerns. Christy Gonzalez is knowledgeable about this serious pregnancy complication; she remains saddened but hopeful. Mother is appropriately grieving the loss of a normal pregnancy [facility] Support and encouragement given. Coping strategies discussed. Mother is in need outside assistance at this time. No Grief referral to NA   Domain 4: Social Aspects of Care Describes that her mom is a big support for her and is caring for other children while she is hospitalized. Parents are not married and per mom her and dad are not getting along currently, she is unsure of whether or not he will be on birth certificate.  We discussed visitation policy and [facility] Other children: mom has 68 yo daughter Na' Zuri unsure of spelling, 18 yo daughter Na'Kayla and 21 yo son Treva The mother resides [address] [place] in [place] [address] Cell number: [phone]   Domain 5: Spiritual, Religious, and Existential Aspects of Care No described needs during this consultation   Domain 6: Cultural Aspects of Care No issues identified.  Mother is: black FOB is: unknown to this provider as he is not present for consultation Mother and FOB speaks fluent: English   Rituals desired: NA   Domain 7: Care of the Patient at the End of Life Mom would be open to bereavement activities in the event of death Mother given ACP to complete for details of EOL care [facility] Bereavement protocol      Domain 8: Ethical and Legal Aspects of Care. No issues.  Parents remain not married at this time    Christy Gonzalez  was instructed to have her OB/MFM team reach out to [facility] should she have further questions.   At the end of the visit, Christy. Gonzalez did not have any further questions but felt better prepared for her baby's arrival.  She appeared to have a clear picture of the future outcomes in regards to an infant born with: extreme prematurity. The mother has been provided with a [facility] business card for contact information, online handouts regarding prematurity.   Thank you for your referral to participate in the care of Christy Gonzalez. The [facility] Neonatal Perinatal Palliative Care team is available for additional information if any further needs  should arise. Feel free to secure chat Dr. Chauncy   I spent 60 minutes in direct consultation with Christy Gonzalez and an additional 30 minutes in reviewing with MFM, reviewing EMR, and documentation for a total of 90 min.  Electronically signed by: Delon Jama Chauncy, MD 03/15/2023 9:05 AM    Medical Director of [facility], Perinatal-Neonatal Palliative Care

## 2023-03-16 NOTE — Progress Notes (Signed)
 ------------------------------------------------------------------------------- Attestation with edits by Harlene Rosina Poisson, MD at 03/16/2023  2:54 PM I reviewed the patient and medical record with the fellow. I agree with the documentation below, edited as appropriate.  Jessica A. Poisson, MD Maternal Fetal Medicine / Obstetrics and Gynecology [facility]  -------------------------------------------------------------------------------  MFM Fellow Attestation:  Christy Gonzalez is a 28 y.o. [phone] at [redacted]w[redacted]d POD2 from classical cesarean delivery at [redacted]w[redacted]d. Patient overall doing well from a recovery standpoint but having some increased discomfort/soreness today. Will work on optimizing PO pain reg today given exam overall reassuring. Continue working towards postop milestones.   I saw and evaluated the patient with the resident physician, Dr. Clayton. I have reviewed the note for completion and accuracy and made edits as appropriate. I agree with the assessment and plan as below.   Dannial Pipes, MD  Maternal Fetal Medicine Fellow Department of Obstetrics and Gynecology [facility]  HIGH RISK OBSTETRICS  DAILY PROGRESS NOTE  ASSESSMENT & PLAN    Christy Gonzalez is a 28 y.o. [phone] now POD2 from classical cesarean delivery at [redacted]w[redacted]d with the following problems being managed:  #POD2, primary cesarean delivery with classical incision in the setting of periviable PPROM, placental abruption, and PTL #Rubella non immune:  - 1/7: Called out with continued vaginal bleeding and pressure. Fetal parts visible with SSE. Cervical change to 5cm. Recommended for delivery. Pt reiterated strong expressed desire for classical cesarean delivery.  - EBL: 900 - Neonate intubated in NICU, s/p Care Always consult prior to delivery - Progressing towards postoperative milestones  Plan: - Routine postop care - Robaxin  750 TID, lidocaine  patch, and heating pad added on for pain  control  -Ambulation encouraged -Offer MMR/TDaP vaccinations PP  -PP mood check visit upon discharge   #Anemia: -Baseline Hgb 10.3, MCV 88.2 (mid-December) -Adm Hgb 8.4 (from 9 at OSH pre-transfer) -1/7: AM Hgb 8.1, fibrinogen 584, ferritin 176 - POD 1 Hgb: 8.2  -Appropriate exam, VS wnl, no anemia sx  #Right Ovarian Cyst: OSH US  - simple 3cm cyst >> rpt WFB US  - 2-3cm hemorrhagic cyst; f/u postpartum #THC/Tobacco Use in Pregnancy: cessation encouraged, NRT prn  #Maternal Considerations - MBT: O Positive  - DVT PPx: SCD, OOB ad lib   #Postpartum Planning: -Infant feeding: pumping, baby in NICU -Contraception: Undecided; will continue to counsel  #Dispo: Continue inpatient care  Please contact the primary team via Haiku chat: [facility] MFM Team (High Risk Obstetrics Consults Maternal Fetal Medicine)  SUBJECTIVE    Having increased incisional pain this morning in s/o frequent NICU visits since delivery. Able to rest some yesterday. More tearful this AM - processing all the events related to hospitalization.  Preeclampsia Symptoms Review: Headaches: No  Vision changes: No Chest pain and/or shortness of breath: No RUQ pain: No  OBJECTIVE   Temp:  [97.8 F (36.6 C)-98.6 F (37 C)] 98.6 F (37 C) Heart Rate:  [62-72] 62 Resp:  [16-18] 16 BP: (95-109)/(49-82) 109/72   Intake/Output Summary (Last 24 hours) at 03/16/2023 0719 Last data filed at 03/15/2023 1623 Gross per 24 hour  Intake --  Output 675 ml  Net -675 ml     GENERAL: tearful, otherwise NAD  CHEST: No increased work of breathing CV: Regular rate ABDOMEN: Soft, no fundal tenderness, incision c/d/I  EXTREMITIES: Nontender, WWP NEURO: AAO x 3, grossly intact  LABS: Oncology Labs       03/15/2023 03/14/2023 03/13/2023  Labs - Hematology  WBC * 13.60*  * 12.80*  12.50*   RBC * 2.77*  * 2.72*  2.86*   Hemoglobin * 8.2*  * 7.9*  8.4*   Hematocrit * 24.1*  * 23.4*  24.8*   Mean Corpuscular Volume (MCV) * 87.2  *  86.0  86.9   MCH * 29.6  * 29.1  29.4   Mean Corpuscular Hemoglobin Conc (MCHC) * 34.0  * 33.8  33.8   Red Cell Distribution Width (RDW-CV) * 12.9  * 13.4  13.1   Platelet Count (Plt) * 281  * 324  265   Mean Platelet Volume (MPV) * 7.3  * 7.2  7.5   Neutrophils Absolute  12.20*  10.90*   Neutrophils %  91  87   Lymphocytes Absolute  1.00  1.00   Lymphocytes %  8  8   Monocytes Absolute  0.20  0.50   Monocytes %  1  4   Basophils Absolute  0.00  0.00   Basophils %  0  0   Eosinophils Absolute  0.00  0.00   Eosinophils %  0  0   NRBC Absolute  0.00  0.00   Fibrinogen Level  * 574*  584*   PT (Protime)  10.4    Labs - General Chemistry  Sodium   135*   Potassium   3.8   Chloride   101   CO2   27   Blood Urea Nitrogen   4*   Creatinine   0.43*   Glucose   76   Calcium  Level Total   9.0   Albumin   3.1*   Bilirubin Total   0.5   Alk Phos Total   79   Aspartate Aminotransferase (AST)   10*   Alanine Aminotransferase   7   Ferritin   176   Labs - Coagulation  PT (Protime)  10.4    INR  1.0    APTT  23.0    Labs - Liver Function  Bilirubin Total   0.5   Alk Phos Total   79   Alanine Aminotransferase   7   Aspartate Aminotransferase (AST)   10*   Labs - Additional  Color   Yellow   Nitrite, Urine   Negative   Labs - Special Chemistry  Protein, Urine   Negative   Tumor Markers  Fibrinogen Level  * 574*  584*   Total Protein   5.8*     Details      * More abnormal values are hidden. Newest values shown. Go to activity for more data.  * More values are hidden. Newest values shown. Go to activity for more data.        IMAGING: No results found.  Today's coding was reached by Medical Decision Making (MDM).   CANDIE Lavern Radar, MD Obstetrics & Gynecology, PGY3  *Some images could not be shown.

## 2023-03-18 NOTE — Discharge Summary (Signed)
 ------------------------------------------------------------------------------- Attestation signed by David Maryjane Lusty, MD at 03/20/2023  4:38 PM Maternal-Fetal Medicine Attending Note  This service was conducted under my supervision and I managed the patient. I have reviewed the documentation for accuracy, and I agree with the assessment and plan of care as documented by Dr. Clayton.   Talla M. Widelock, MD Section of Maternal-Fetal Medicine [facility]    -------------------------------------------------------------------------------  HIGH RISK OBSTETRICS DISCHARGE SUMMARY   Referring Provider: Farrell    Admission Date: 03/13/2023   Discharge Date: Sat 03/18/2023   Admission Diagnosis:  IUP @ [redacted]w[redacted]d PPROM  PTL  Chronic abruption Right ovarian cyst  Tobacco Use in Pregnancy  THC Use in Pregnancy  Rubella non immune   Discharge Diagnosis:  POD3, classical cesarean delivery @[redacted]w[redacted]d   Right ovarian cyst  Tobacco Use in Pregnancy  THC Use in Pregnancy  Rubella non immune   Procedures/Delivery information: Primary classcical cesarean section, see op note for details   Consults:  NICU/CareAlways Lactation  Pertinent Labs/Imaging: Recent CBC    03/15/23 0805  WBC 13.60*  RBC 2.77*  HGB 8.2*  HCT 24.1*  MCV 87.2  MCHC 34.0  RDW 12.9  PLT 281  MPV 7.3     Discharge Physical Exam:  Temp:  [98.1 F (36.7 C)] 98.1 F (36.7 C) Heart Rate:  [65-70] 70 Resp:  [17-18] 18 BP: (107-115)/(73-74) 107/73  General: alert, oriented, NAD Cardio: warm and well perfused Pulm: no increased work of breathing Abdomen: soft, sutured incision c/d/I Extremities:  Warm and well-perfused, nontender, nonedematous Neuro: A&O x 3   Hospital Summary  [name] is a 28 y.o. now [id number] who initially presented to OSH at [redacted]w[redacted]d with vaginal bleeding and contractions - concerning for PPROM and threatened preterm labor. Overall, OSH evaluation was highly c/w  PPROM and PTL. Thus, due to gestational age and potential delivery, she was transferred to [facility] for advanced obstetric and neonatal care. Prior to transfer, she was initiated on latency antibiotics and received betamethasone .   Upon arrival to [facility] Triage, evaluation confirmed PPROM. Given patient's periviability and desire for neonatal intervention, NICU was urgently consulted. Following consultation, she was admitted to L&D for expectant management. On admission, she was continued on latency antibiotics and she received additional betamethasone . She also received magnesium sulfate for fetal neuro protection  While admitted, patient received additional counseling from both the Maternal-Fetal Medicine and [facility] Neonataology team. She continued endorse strong desire for fetal intervention. Ultimately, patient continued to have labor progression and vaginal bleeding. Given the clinical circumstances, a classical cesarean delivery was subsequently performed to facilitate the safest mode of delivery for the fetus.  She underwent above delivery (see op note for details) and was transferred back to the high risk obstetrics postpartum.   By POD3, the patient was tolerating a regular diet without nausea or vomiting, she was passing flatus, had adequate PO pain control, and was ambulating and voiding without difficulty. She remained afebrile with stable vital signs. Her abdomen remained soft, appropriately tender to palpation, and non-distended with normal bowel sounds. Her fundus remained firm and nontender, and her incision was clean, dry, and intact.    Consequently, the decision was made to discharge the patient to home with follow up as below. The patient was given routine postpartum discharge instructions, with specific instructions to call or return for fever, worsening abdominal pain, redness or drainage from her incision site, uncontrollable pain or heavy vaginal bleeding. The patient was also given  prescriptions  that are included in her discharge information. The patient expressed her understanding of the above information and all of her questions were answered to her satisfaction.  BMZ 1/6 -1/7  Discharge information:  1) Maternal Blood Type: O POS 2) Contraception:follow up a PP visit 3) Infant feedin: breast/bottle   Discharge Medications:   Medication List     START taking these medications    acetaminophen  500 mg tablet Commonly known as: TYLENOL  Take 2 tablets (1,000 mg total) by mouth every 8 (eight) hours.   ferrous sulfate  325 mg (65 mg iron ) tablet Take 1 tablet (325 mg total) by mouth daily.   ibuprofen  600 mg tablet Commonly known as: MOTRIN  Take 1 tablet (600 mg total) by mouth every 6 (six) hours.   methocarbamoL  750 mg tablet Commonly known as: ROBAXIN  Take 1 tablet (750 mg total) by mouth 3 (three) times a day.   norethindrone 0.35 mg Tab Take 1 tablet (0.35 mg total) by mouth daily.   oxyCODONE  5 mg immediate release tablet Commonly known as: ROXICODONE  Take 1 tablet (5 mg total) by mouth every 4 (four) hours as needed for moderate pain. (Take 1 tablet (5 mg total) by mouth every 4 (four) hours as needed for moderate pain (4-6).)   polyethylene glycol 17 gram Powd powder Commonly known as: MIRALAX Mix 1 capful (17 g) and dissolve in 8 ounces of water and drink once daily as needed for constipation (Take 17 g by mouth daily as needed for constipation.)   Prenatal Vitamin 27 mg iron - 0.8 mg tablet Generic drug: prenatal vitamin-iron -folic acid Take 1 tablet by mouth daily.   simethicone  80 mg chewable tablet Commonly known as: MYLICON Chew 1 tablet (80 mg total) 4 (four) times a day for 10 days.   Stimulant Laxative Plus 8.6-50 mg per tablet Generic drug: sennosides-docusate sodium  Take 2 tablets by mouth daily.         Where to Get Your Medications     These medications were sent to [facility] [address] [place] [address]    Hours: Open Monday 12am to Friday 11:59pm; Sat-Sun: Closed; Holidays: Closed Thanksgiving Phone: [phone]  acetaminophen  500 mg tablet ferrous sulfate  325 mg (65 mg iron ) tablet ibuprofen  600 mg tablet methocarbamoL  750 mg tablet norethindrone 0.35 mg Tab oxyCODONE  5 mg immediate release tablet polyethylene glycol 17 gram Powd powder Prenatal Vitamin 27 mg iron - 0.8 mg tablet simethicone  80 mg chewable tablet Stimulant Laxative Plus 8.6-50 mg per tablet      Follow up: Patient instructed to follow-up with [employer] for BP and incision check. She was also instructed to follow up with primary OBGYN provider at 4-6 weeks postpartum.   CANDIE Lavern Radar, MD Obstetrics & Gynecology, PGY3

## 2023-03-20 NOTE — Discharge Summary (Signed)
 Discharge Summary     Patient Name: Christy Gonzalez DOB: [birth year] MRN: [id number]  Date of admission: 03/11/2023 Delivery date:This patient has no babies on file. Delivering provider: This patient has no babies on file. Date of discharge: 03/13/2023.  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                                              Hospital course: 28 y/o [id number] who presented at 21 weeks 5 days EGA with abdominal pain and vaginal bleeding. She was found to have bacteria vaginosis and was treated with oral metronidazole . Her bleeding was thought to be from chronic placenta abruption. She then had rupture of membranes and preterm labor and was started on latency antibiotics. At [redacted] weeks EGA she desired transfer to [facility] to be at a facility with more advanced NICU care.  She arrived at [facility] after transfer by CARE link.  Magnesium Sulfate received: No BMZ received: Yes Rhophylac:No  Immunizations received: Immunization History  Administered Date(s) Administered   Influenza-Unspecified 12/11/2014   MMR 07/12/2018   Tdap 05/22/2015    Physical exam  Vitals:   03/13/23 1003 03/13/23 1031 03/13/23 1101 03/13/23 1131  BP: 117/71 118/69 117/75 (!) 112/58  Pulse: 94 84 85 (!) 101  Resp: 14     Temp: (!) 97.5 F (36.4 C)     TempSrc:      SpO2:      Weight:      Height:       General:  alert, cooperative, and no distress Abdomen: Soft, non tender, gravid.  Vaginal exam: 4/60%, Breech presentation.  DVT Evaluation: No evidence of DVT seen on physical exam. No significant calf/ankle edema. Labs: Lab Results  Component Value Date   WBC 15.6 (H) 03/13/2023   HGB 8.7 (L) 03/13/2023   HCT 26.0 (L) 03/13/2023   MCV 87.2 03/13/2023   PLT 261 03/13/2023      Latest Ref Rng & Units 02/27/2023    5:12 PM  CMP  Glucose 70 - 99 mg/dL 74   BUN 6 - 20 mg/dL 5   Creatinine 9.55 - 8.99 mg/dL 9.45   Sodium 864 - 854 mmol/L 135   Potassium 3.5 - 5.1 mmol/L 3.0   Chloride 98 - 111 mmol/L 101   CO2 22 - 32 mmol/L 27   Calcium  8.9 - 10.3 mg/dL 9.1   Total Protein 6.5 - 8.1 g/dL 6.1   Total Bilirubin <8.7 mg/dL 0.3   Alkaline Phos 38 - 126 U/L 42   AST 15 - 41 U/L 12   ALT 0 - 44 U/L 8    Edinburgh Score:    29/08/2018    5:32 PM  Edinburgh Postnatal Depression Scale Screening Tool  I have been able to laugh and see the funny side of things. 0  I have looked forward with enjoyment  to things. 0  I have blamed myself unnecessarily when things went wrong. 2  I have been anxious or worried for no good reason. 0  I have felt scared or panicky for no good reason. 2  Things have been getting on top of me. 1  I have been so unhappy that I have had difficulty sleeping. 0  I have felt sad or miserable. 1  I have been so unhappy that I have been crying. 1  The thought of harming myself has occurred to me. 0  Edinburgh Postnatal Depression Scale Total 7   No data recorded  After visit meds:  Allergies as of 03/13/2023   No Known Allergies      Medication List     ASK your doctor about these medications    acetaminophen  500 MG tablet Commonly known as: TYLENOL  Take 500 mg by mouth every 6 (six) hours as needed.   cyclobenzaprine  5 MG tablet Commonly known as: FLEXERIL  Take 1 tablet (5 mg total) by mouth every 8 (eight) hours as needed.   Doxylamine -Pyridoxine  10-10 MG  Tbec Take 1 tablet by mouth in the morning and at bedtime.   famotidine  20 MG tablet Commonly known as: PEPCID  Take 1 tablet (20 mg total) by mouth 2 (two) times daily.   ferrous sulfate  325 (65 FE) MG tablet Take 1 tablet (325 mg total) by mouth every other day.   ondansetron  8 MG disintegrating tablet Commonly known as: ZOFRAN -ODT Take 1 tablet (8 mg total) by mouth every 8 (eight) hours as needed for nausea.   promethazine  25 MG tablet Commonly known as: PHENERGAN  Take 25 mg by mouth every 6 (six) hours as needed for nausea or vomiting.   scopolamine  1 MG/3DAYS Commonly known as: TRANSDERM-SCOP Place 1 patch (1.5 mg total) onto the skin every 3 (three) days.         Discharge to [facility] in stable condition.  Future Appointments: Future Appointments  Date Time Provider Department Center  03/31/2023  1:15 PM [facility] NURSE [facility]  03/31/2023  1:30 PM [facility] US5 [facility]    03/20/2023 Jerolyn LELON Foil, MD

## 2023-03-20 NOTE — Telephone Encounter (Signed)
 03/20/2023 7:20 AM  EMR Reviewed  Patient recently discharged from: [facility]  Discharge Survey Flag(s):  Discharge Instructions (Read them? No) Meds, taking as directed? no  Call Outcome:  Outcome: Not contacted (met exclusion criteria)  Narrative Notes Routed to clinic for follow up.       Electronically signed by : Asberry JAYSON Drones, RN 03/20/2023 7:20 AM

## 2023-03-21 NOTE — Lactation Note (Signed)
 This note was copied from a baby's chart.  Lactation stopped by this patient's room for a lactation consultation at 1834, 2145, and 2331. No parents were present at bedside. Lactation will continue to follow and support this family during their NICU stay.

## 2023-03-22 NOTE — Lactation Note (Signed)
 This note was copied from a baby's chart.        Lactation stopped by this patient's room for a lactation consultant at 1907 and 2225. No parents were at the bedside. Lactation will continue to follow and support this family during their NICU stay.

## 2023-03-22 NOTE — Telephone Encounter (Signed)
 03/22/2023 9:08 AM  EMR Reviewed  Patient recently discharged from: [facility]  EMMI Survey Flag(s):  Transportation to Follow Up Appointment  Coping    Call Outcome:   Outcome: Not contacted (met exclusion criteria)   Narrative Notes Routed to clinic for follow up.       Electronically signed by : Avel JONETTA Hacking, RN 03/22/2023 9:08 AM

## 2023-03-23 NOTE — Lactation Note (Signed)
 This note was copied from a baby's chart.   Christy Gonzalez, is at postpartum day 9 following a C-Section, Classical delivery at Unknown resulting in No obstetric history on file.   NICU Lactation Consultation   Baby's first bottle: TBD. First Breastfeed (dry/full): TBD. Infant feeding plan: Exclusive Breastfeed.   Follow-up. 9-day postpartum; pre- milk transition.   NICU room discussion. Pumping is inconsistent.  Mom reports pumping 2 times a day.  LC reeducated Mom on basic milk production, frequent milk removal, milk transition, and delayed lactogenesis. LC recommended Mom pump every 2-3 hrs for 15 mins. Mom and LC created a pump schedule for the remainder of the night and moving forward from tomorrow:          Pumping Schedule/Plan for Mom  Plan: Mom to pump at 6:30p (before catching the shuttle back to [facility] tonight), pump at 9p, at 12a (before bed), 6a (when she wakes), 9a, 11a ( when she arrives to baby's bedside) and from 11a she can pump every 2-3 hrs until she leaves for the day. Mom has been educated to not exceed beyond 6 hours without pumping.  She also knows that 6 hr break from pumping can only occur once a day. Mom has agreed to the plan and reports she will try her best to stick to it.  Mom reports using the manual pump because she doesn't have a pump at the [facility]).  She was also unaware that they had pumps.  LC encouraged Mom to check with to see if they have any available now.  LC encouraged Mom to pump when she is at baby's bedside. LC set up her pump kit and supplies at baby's bedside. LC also encouraged Mom to call her insurance company for a breast pump and to reach out to [facility] to sign up as she had planned to do so prior to the baby coming early.  Mom reports she visits with baby from 11a-7p at the latest. Please try to catch her during those times. Mom reports no milk volume output. No reports of pain or discomfort.  Mom will continue  to stay observant of any acute breast changes.    Lactation did not observe pumping.       General Education Reviewed:  Pumping Schedule/Duration (every 2-3 hours/15-minutes durations). Breast Stimulation and Massage. Milk Production. Frequent Milk Removal. Delayed Lactogenesis. Milk Transition. Use of Comfort Gels. Call [facility]. [employer] for Breast Pump.  Mom verbalized understanding the education provided and had no questions upon LC exiting the room.  Supplies: Breast Pump Kit. [employer]. [employer] Soap. Lanolin. Comfort Gets.   LC encouraged Mom to call out if/when lactation services are needed.  Lactation will continue to follow and support this family during their NICU stay.  Lactation Consultation Lactation Consultation Reason for Consult: Follow-up assessment, NICU baby, Infant < 6lbs Feeding Plan: Exclusive breastfeed Maternal Information Maternal Information Has mother breastfed before?: Yes How long did the mother previously breastfeed?:  (a few months 5 yrs ago) Breast Trauma/Surgery/History:  (nipple piercings) Breastfeeding Maternal Risk Factors: Epidural Breastfeeding Baby Risk Factors: Transition in ICN or SCN Infant to breast within first hour of birth?: No Breastfeeding Delayed Due to: Infant status WIC Program: [place]; [place] Assessment Assessment Latch Assessment- Baby: Feeding not observed Latch Assessment Mother : Pumping not observed, Latch not observed Reminders: Nurse or pump 8-12x/24 hours, Switch and stimulate both breasts Instruct Mom on:: Breast care, Breast  stimulation, Breast massage, Call [facility], Call insurance for electric breast pump, How to call for help, Milk production expectations, Nipple care, When to call for help Lactation Interventions: Breastfeed pump kit given, Education and Support   Breast Pump Breast Pump Pump: Electric, Manual Pump Review/Education: Setup, frequency, and cleaning,  Milk storage Initiated byBETHA Session Date Initiated: 03/15/23 Flange Sizes:  (18 mm L/R)  Patient Follow-up Needed Patient Follow-Up Lactation Follow-up Needed: Follow-up Follow-Up Type: Inpatient

## 2023-03-27 ENCOUNTER — Other Ambulatory Visit: Payer: Self-pay

## 2023-03-31 ENCOUNTER — Other Ambulatory Visit: Payer: Medicaid Other

## 2023-03-31 ENCOUNTER — Ambulatory Visit: Payer: Medicaid Other

## 2023-04-03 DIAGNOSIS — Z8751 Personal history of pre-term labor: Secondary | ICD-10-CM | POA: Insufficient documentation

## 2023-04-03 NOTE — Telephone Encounter (Signed)
 Patient calling states her baby passed away on 08-Dec-2023 and her mother passed on 12/09/2023. She has cancelled her appointment and states she would like to speak with the woman who met her baby in the hospital and scheduled her for a post partum appointment with our clinic. It says Mason Finder scheduled this appointment, but the patient was unsure of the womans name ( she described her as mixed). Please advise/contact to assist

## 2023-04-03 NOTE — Telephone Encounter (Signed)
 [facility] message sent to patient.  Rock Samie Stacks, RN

## 2023-09-04 DIAGNOSIS — F418 Other specified anxiety disorders: Secondary | ICD-10-CM | POA: Insufficient documentation

## 2023-11-06 ENCOUNTER — Encounter (HOSPITAL_COMMUNITY): Payer: Self-pay | Admitting: Emergency Medicine

## 2023-11-06 ENCOUNTER — Emergency Department (HOSPITAL_COMMUNITY)

## 2023-11-06 ENCOUNTER — Emergency Department (HOSPITAL_COMMUNITY)
Admission: EM | Admit: 2023-11-06 | Discharge: 2023-11-06 | Disposition: A | Discharge status: Home / Self Care | Attending: Emergency Medicine | Admitting: Emergency Medicine

## 2023-11-06 DIAGNOSIS — R1084 Generalized abdominal pain: Secondary | ICD-10-CM | POA: Insufficient documentation

## 2023-11-06 DIAGNOSIS — S3992XA Unspecified injury of lower back, initial encounter: Secondary | ICD-10-CM | POA: Diagnosis present

## 2023-11-06 DIAGNOSIS — S39012A Strain of muscle, fascia and tendon of lower back, initial encounter: Secondary | ICD-10-CM | POA: Insufficient documentation

## 2023-11-06 DIAGNOSIS — Z331 Pregnant state, incidental: Secondary | ICD-10-CM

## 2023-11-06 DIAGNOSIS — Y92410 Unspecified street and highway as the place of occurrence of the external cause: Secondary | ICD-10-CM | POA: Diagnosis not present

## 2023-11-06 LAB — CBC
HCT: 35.8 % — ABNORMAL LOW (ref 36.0–46.0)
Hemoglobin: 11.8 g/dL — ABNORMAL LOW (ref 12.0–15.0)
MCH: 28.9 pg (ref 26.0–34.0)
MCHC: 33 g/dL (ref 30.0–36.0)
MCV: 87.5 fL (ref 80.0–100.0)
Platelets: 248 K/uL (ref 150–400)
RBC: 4.09 MIL/uL (ref 3.87–5.11)
RDW: 13.1 % (ref 11.5–15.5)
WBC: 4.3 K/uL (ref 4.0–10.5)
nRBC: 0 % (ref 0.0–0.2)

## 2023-11-06 LAB — HCG, QUANTITATIVE, PREGNANCY: hCG, Beta Chain, Quant, S: 21565 m[IU]/mL — ABNORMAL HIGH (ref <5)

## 2023-11-06 LAB — COMPREHENSIVE METABOLIC PANEL WITH GFR
ALT: 9 U/L (ref 0–44)
AST: 14 U/L — ABNORMAL LOW (ref 15–41)
Albumin: 4.1 g/dL (ref 3.5–5.0)
Alkaline Phosphatase: 47 U/L (ref 38–126)
Anion gap: 10 (ref 5–15)
BUN: 8 mg/dL (ref 6–20)
CO2: 25 mmol/L (ref 22–32)
Calcium: 9.7 mg/dL (ref 8.9–10.3)
Chloride: 103 mmol/L (ref 98–111)
Creatinine, Ser: 0.75 mg/dL (ref 0.44–1.00)
GFR, Estimated: >60 mL/min (ref >60)
Glucose, Bld: 87 mg/dL (ref 70–99)
Potassium: 3.6 mmol/L (ref 3.5–5.1)
Sodium: 138 mmol/L (ref 135–145)
Total Bilirubin: 0.3 mg/dL (ref 0.0–1.2)
Total Protein: 7.1 g/dL (ref 6.5–8.1)

## 2023-11-06 MED ORDER — LIDOCAINE 5 % EX PTCH
1.0000 | MEDICATED_PATCH | CUTANEOUS | Status: DC
  Administered 2023-11-06: 1 via TRANSDERMAL
  Filled 2023-11-06: qty 1

## 2023-11-06 MED ORDER — ACETAMINOPHEN 325 MG PO TABS
650.0000 mg | ORAL_TABLET | Freq: Once | ORAL | Status: AC
  Administered 2023-11-06: 650 mg via ORAL
  Filled 2023-11-06: qty 2

## 2023-11-06 NOTE — ED Triage Notes (Signed)
 Pt states that she is also pregnant but not sure how far along she is
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
## 2023-11-06 NOTE — Discharge Instructions (Signed)
 Your workup today was reassuring.  It does show that you are about [redacted] weeks along based on your ultrasound.  Please follow-up with your OB/GYN for further evaluation.  Return to the ER for worsening symptoms.
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
## 2023-11-16 DIAGNOSIS — Z8759 Personal history of other complications of pregnancy, childbirth and the puerperium: Secondary | ICD-10-CM | POA: Insufficient documentation

## 2023-11-27 ENCOUNTER — Other Ambulatory Visit: Payer: Self-pay | Admitting: Obstetrics and Gynecology

## 2023-11-27 DIAGNOSIS — Z8759 Personal history of other complications of pregnancy, childbirth and the puerperium: Secondary | ICD-10-CM

## 2023-12-28 DIAGNOSIS — O09219 Supervision of pregnancy with history of pre-term labor, unspecified trimester: Secondary | ICD-10-CM | POA: Insufficient documentation

## 2024-01-04 DIAGNOSIS — O09291 Supervision of pregnancy with other poor reproductive or obstetric history, first trimester: Secondary | ICD-10-CM | POA: Insufficient documentation

## 2024-01-18 ENCOUNTER — Other Ambulatory Visit: Payer: Self-pay | Admitting: *Deleted

## 2024-01-18 ENCOUNTER — Ambulatory Visit: Attending: Obstetrics and Gynecology

## 2024-01-18 ENCOUNTER — Ambulatory Visit (HOSPITAL_BASED_OUTPATIENT_CLINIC_OR_DEPARTMENT_OTHER): Admitting: Obstetrics

## 2024-01-18 VITALS — BP 100/65 | HR 83

## 2024-01-18 DIAGNOSIS — O09291 Supervision of pregnancy with other poor reproductive or obstetric history, first trimester: Secondary | ICD-10-CM | POA: Diagnosis present

## 2024-01-18 DIAGNOSIS — Z3A16 16 weeks gestation of pregnancy: Secondary | ICD-10-CM | POA: Insufficient documentation

## 2024-01-18 DIAGNOSIS — O09212 Supervision of pregnancy with history of pre-term labor, second trimester: Secondary | ICD-10-CM | POA: Diagnosis not present

## 2024-01-18 DIAGNOSIS — Z8759 Personal history of other complications of pregnancy, childbirth and the puerperium: Secondary | ICD-10-CM

## 2024-01-18 DIAGNOSIS — Z98891 History of uterine scar from previous surgery: Secondary | ICD-10-CM | POA: Diagnosis present

## 2024-01-18 DIAGNOSIS — O34219 Maternal care for unspecified type scar from previous cesarean delivery: Secondary | ICD-10-CM | POA: Diagnosis not present

## 2024-01-18 DIAGNOSIS — Z8751 Personal history of pre-term labor: Secondary | ICD-10-CM | POA: Insufficient documentation

## 2024-01-18 DIAGNOSIS — O09292 Supervision of pregnancy with other poor reproductive or obstetric history, second trimester: Secondary | ICD-10-CM

## 2024-01-18 DIAGNOSIS — Z3686 Encounter for antenatal screening for cervical length: Secondary | ICD-10-CM

## 2024-01-18 NOTE — Progress Notes (Signed)
 MFM Consult Note  Christy Gonzalez is currently at 16 weeks and 1 day.  She was seen for a cervical length measurement and limited ultrasound as her last pregnancy in January 2025 was complicated by PPROM followed by preterm labor with a fetus in the breech presentation requiring a classical C-section at 22 weeks and 1 day.  Prior to that pregnancy in January 2025, she had 3 full-term uncomplicated vaginal deliveries.  She had a cell free DNA test drawn earlier in her current pregnancy that showed a low risk for trisomy 11, 89, and 13.  A female fetus is predicted.  A limited ultrasound performed today showed a viable singleton intrauterine gestation in the vertex presentation with normal amniotic fluid.    A transvaginal ultrasound performed today showed a cervical length of 3.4 cm long without any signs of funneling.    The patient was reassured that based on the normal cervical length today, her risk of a preterm birth is low at this time.  Due to her history of prior PPROM, a detailed fetal anatomy scan and transvaginal cervical length was scheduled in our office in 4 weeks.  To decrease her risk of PPROM, she should be screened and treated for vaginal infections should she complain of any foul-smelling vaginal discharge.  Due to her history of a prior classical C-section, a repeat C-section should be scheduled for delivery of her current pregnancy at around 37 weeks.    The patient stated that all of her questions were answered today.  A total of 20 minutes was spent counseling and coordinating the care for this patient.  Greater than 50% of the time was spent in direct face-to-face contact.

## 2024-02-01 ENCOUNTER — Encounter (HOSPITAL_COMMUNITY): Payer: Self-pay | Admitting: Obstetrics and Gynecology

## 2024-02-01 ENCOUNTER — Inpatient Hospital Stay (HOSPITAL_COMMUNITY)
Admission: AD | Admit: 2024-02-01 | Discharge: 2024-02-01 | Disposition: A | Discharge status: Home / Self Care | Attending: Obstetrics and Gynecology | Admitting: Obstetrics and Gynecology

## 2024-02-01 ENCOUNTER — Other Ambulatory Visit: Payer: Self-pay

## 2024-02-01 DIAGNOSIS — R109 Unspecified abdominal pain: Secondary | ICD-10-CM | POA: Diagnosis present

## 2024-02-01 DIAGNOSIS — O219 Vomiting of pregnancy, unspecified: Secondary | ICD-10-CM | POA: Diagnosis not present

## 2024-02-01 DIAGNOSIS — O26893 Other specified pregnancy related conditions, third trimester: Secondary | ICD-10-CM | POA: Diagnosis not present

## 2024-02-01 DIAGNOSIS — Z3A18 18 weeks gestation of pregnancy: Secondary | ICD-10-CM | POA: Insufficient documentation

## 2024-02-01 LAB — URINALYSIS, ROUTINE W REFLEX MICROSCOPIC
Bilirubin Urine: NEGATIVE
Glucose, UA: NEGATIVE mg/dL
Hgb urine dipstick: NEGATIVE
Ketones, ur: NEGATIVE mg/dL
Nitrite: NEGATIVE
Protein, ur: 30 mg/dL — AB
Specific Gravity, Urine: 1.038 — ABNORMAL HIGH (ref 1.005–1.030)
pH: 5 (ref 5.0–8.0)

## 2024-02-01 LAB — COMPREHENSIVE METABOLIC PANEL WITH GFR
ALT: 11 U/L (ref 0–44)
AST: 25 U/L (ref 15–41)
Albumin: 3.3 g/dL — ABNORMAL LOW (ref 3.5–5.0)
Alkaline Phosphatase: 34 U/L — ABNORMAL LOW (ref 38–126)
Anion gap: 11 (ref 5–15)
BUN: 8 mg/dL (ref 6–20)
CO2: 22 mmol/L (ref 22–32)
Calcium: 9.2 mg/dL (ref 8.9–10.3)
Chloride: 102 mmol/L (ref 98–111)
Creatinine, Ser: 0.6 mg/dL (ref 0.44–1.00)
GFR, Estimated: >60 mL/min (ref >60)
Glucose, Bld: 93 mg/dL (ref 70–99)
Potassium: 3.7 mmol/L (ref 3.5–5.1)
Sodium: 135 mmol/L (ref 135–145)
Total Bilirubin: 1.1 mg/dL (ref 0.0–1.2)
Total Protein: 6.8 g/dL (ref 6.5–8.1)

## 2024-02-01 LAB — CBC
HCT: 30.5 % — ABNORMAL LOW (ref 36.0–46.0)
Hemoglobin: 10.3 g/dL — ABNORMAL LOW (ref 12.0–15.0)
MCH: 29.6 pg (ref 26.0–34.0)
MCHC: 33.8 g/dL (ref 30.0–36.0)
MCV: 87.6 fL (ref 80.0–100.0)
Platelets: 242 K/uL (ref 150–400)
RBC: 3.48 MIL/uL — ABNORMAL LOW (ref 3.87–5.11)
RDW: 12.5 % (ref 11.5–15.5)
WBC: 7.8 K/uL (ref 4.0–10.5)
nRBC: 0 % (ref 0.0–0.2)

## 2024-02-01 MED ORDER — PROMETHAZINE HCL 25 MG/ML IJ SOLN
25.0000 mg | Freq: Once | INTRAMUSCULAR | Status: AC
  Administered 2024-02-01: 25 mg via INTRAMUSCULAR
  Filled 2024-02-01: qty 1

## 2024-02-01 MED ORDER — CYCLOBENZAPRINE HCL 5 MG PO TABS: 5.0000 mg | ORAL_TABLET | Freq: Three times a day (TID) | ORAL | 0 refills | Status: DC | PRN

## 2024-02-01 MED ORDER — ACETAMINOPHEN 500 MG PO TABS
1000.0000 mg | ORAL_TABLET | Freq: Once | ORAL | Status: AC
  Administered 2024-02-01: 1000 mg via ORAL
  Filled 2024-02-01: qty 2

## 2024-02-01 MED ORDER — ONDANSETRON 8 MG PO TBDP: 8.0000 mg | ORAL_TABLET | Freq: Three times a day (TID) | ORAL | 0 refills | Status: DC | PRN

## 2024-02-01 MED ORDER — CYCLOBENZAPRINE HCL 5 MG PO TABS
5.0000 mg | ORAL_TABLET | Freq: Once | ORAL | Status: AC
  Administered 2024-02-01: 5 mg via ORAL
  Filled 2024-02-01: qty 1

## 2024-02-01 MED ORDER — ACETAMINOPHEN 500 MG PO TABS: 1000.0000 mg | ORAL_TABLET | Freq: Four times a day (QID) | ORAL | 0 refills | Status: DC | PRN

## 2024-02-01 NOTE — MAU Note (Signed)
 CATALIA MASSETT is a 28 y.o. at [redacted]w[redacted]d here in MAU reporting: yesterday was seen at [facility] - had hot flashes and N/V - believes she strained something from vomiting because the right side of her abdomen has been sore and now having lower back pain. Denies VB or LOF. Took tylenol  at 0800.   Onset of complaint: yesterday  Pain score:  7 -abdomen; 8 - back  Vitals:   02/01/24 1916  BP: 115/65  Pulse: 76  Resp: 16  Temp: 98.5 F (36.9 C)  SpO2: 99%     FHT: 152  Lab orders placed from triage: UA

## 2024-02-01 NOTE — Discharge Instructions (Signed)
 You came into the MAU because you were having nausea, vomiting and R-sided abdominal pain. We gave you medications to help with the nausea and that went away. We also gave you medications to help with pain which helped some. We will send you home with medications to help with nausea, vomiting and pain at home. I do recommend drinking lots of water and resting throughout the weekend to help with your symptoms. IT is important to you eat and stay hydrated for you and your baby.

## 2024-02-01 NOTE — MAU Provider Note (Signed)
 Chief Complaint:  Abdominal Pain and Back Pain   HPI   None     Christy Gonzalez is a 28 y.o. [id number] at [redacted]w[redacted]d who presents to maternity admissions reporting right-sided abdominal pain and nausea. Had a prenatal appointment yesterday and had severe nausea and vomiting throughout the appointment. States that they gave her an injection of medication to help with the nausea. Later that evening, she started having right-sided abdominal pain that wraps around into her back. Last took tylenol  for pain this morning around 8am.   Pregnancy Course: Receives prenatal care at [facility]  Past Medical History:  Diagnosis Date   Anemia    Iron  deficiency    Spontaneous vaginal delivery 05/21/2015   OB History  Gravida Para Term Preterm AB Living  5 4 3 1  3   SAB IAB Ectopic Multiple Live Births     0 4    # Outcome Date GA Lbr Len/2nd Weight Sex Type Anes PTL Lv  5 Current           4 Preterm 03/14/23 [redacted]w[redacted]d    CS-Classical  Y ND     Complications: Premature rupture of membranes  3 Term 07/10/18 [redacted]w[redacted]d 00:40 / 00:11 2860 g M Vag-Spont EPI  LIV  2 Term 05/20/15 [redacted]w[redacted]d 07:50 / 00:03 2620 g F Vag-Spont None  LIV  1 Term 07/30/10    F Vag-Spont   LIV   Past Surgical History:  Procedure Laterality Date   CESAREAN SECTION     Family History  Problem Relation Age of Onset   Hypertension Mother    Fibroids Mother    Social History   Tobacco Use   Smoking status: Former    Types: Cigars   Smokeless tobacco: Never   Tobacco comments:    <5 cigars a day  Vaping Use   Vaping status: Never Used  Substance Use Topics   Alcohol use: No   Drug use: Yes    Frequency: 2.0 times per week    Types: Marijuana    Comment: 01/31/24 last use   No Known Allergies No medications prior to admission.    I have reviewed patient's Past Medical Hx, Surgical Hx, Family Hx, Social Hx, medications and allergies.   ROS  Pertinent items noted in HPI and remainder of comprehensive ROS otherwise negative.    PHYSICAL EXAM  No data found.   Constitutional: Well-developed, well-nourished female in no acute distress.  Cardiovascular: Warm and well-perfused Respiratory: normal effort, no problems with respiration noted GI: Abd soft, non-distended, tender to palpation in RUQ and RLQ MS: Extremities nontender, no edema, normal ROM Neurologic: Alert and oriented x 4.  GU: no CVA tenderness Pelvic: deferred       Labs: Results for orders placed or performed during the hospital encounter of 02/01/24 (from the past 72 hours)  Urinalysis, Routine w reflex microscopic -Urine, Clean Catch     Status: Abnormal   Collection Time: 02/01/24  7:27 PM  Result Value Ref Range   Color, Urine AMBER (A) YELLOW    Comment: BIOCHEMICALS MAY BE AFFECTED BY COLOR   APPearance HAZY (A) CLEAR   Specific Gravity, Urine 1.038 (H) 1.005 - 1.030   pH 5.0 5.0 - 8.0   Glucose, UA NEGATIVE NEGATIVE mg/dL   Hgb urine dipstick NEGATIVE NEGATIVE   Bilirubin Urine NEGATIVE NEGATIVE   Ketones, ur NEGATIVE NEGATIVE mg/dL   Protein, ur 30 (A) NEGATIVE mg/dL   Nitrite NEGATIVE NEGATIVE   Leukocytes,Ua TRACE (A)  NEGATIVE   RBC / HPF 0-5 0 - 5 RBC/hpf   WBC, UA 11-20 0 - 5 WBC/hpf   Bacteria, UA RARE (A) NONE SEEN   Squamous Epithelial / HPF 11-20 0 - 5 /HPF   Mucus PRESENT     Comment: Performed at [facility], [address] [place], [place] [address]  CBC     Status: Abnormal   Collection Time: 02/01/24  8:08 PM  Result Value Ref Range   WBC 7.8 4.0 - 10.5 K/uL   RBC 3.48 (L) 3.87 - 5.11 MIL/uL   Hemoglobin 10.3 (L) 12.0 - 15.0 g/dL   HCT 69.4 (L) 63.9 - 53.9 %   MCV 87.6 80.0 - 100.0 fL   MCH 29.6 26.0 - 34.0 pg   MCHC 33.8 30.0 - 36.0 g/dL   RDW 87.4 88.4 - 84.4 %   Platelets 242 150 - 400 K/uL   nRBC 0.0 0.0 - 0.2 %    Comment: Performed at [facility], [address], [place], [place] [address]  Comprehensive metabolic panel with GFR     Status: Abnormal   Collection Time: 02/01/24  8:08  PM  Result Value Ref Range   Sodium 135 135 - 145 mmol/L   Potassium 3.7 3.5 - 5.1 mmol/L   Chloride 102 98 - 111 mmol/L   CO2 22 22 - 32 mmol/L   Glucose, Bld 93 70 - 99 mg/dL    Comment: Glucose reference range applies only to samples taken after fasting for at least 8 hours.   BUN 8 6 - 20 mg/dL   Creatinine, Ser 9.39 0.44 - 1.00 mg/dL   Calcium  9.2 8.9 - 10.3 mg/dL   Total Protein 6.8 6.5 - 8.1 g/dL   Albumin 3.3 (L) 3.5 - 5.0 g/dL   AST 25 15 - 41 U/L   ALT 11 0 - 44 U/L   Alkaline Phosphatase 34 (L) 38 - 126 U/L   Total Bilirubin 1.1 0.0 - 1.2 mg/dL   GFR, Estimated >39 >39 mL/min    Comment: (NOTE) Calculated using the CKD-EPI Creatinine Equation (2021)    Anion gap 11 5 - 15    Comment: Performed at [facility], [address], [place], [place] [address]     Imaging:  No results found.  MDM & MAU COURSE  MDM: Moderate  MAU Course: Orders Placed This Encounter  Procedures   Urinalysis, Routine w reflex microscopic -Urine, Clean Catch   CBC   Comprehensive metabolic panel with GFR   Discharge patient   Meds ordered this encounter  Medications   promethazine  (PHENERGAN ) injection 25 mg   acetaminophen  (TYLENOL ) tablet 1,000 mg   cyclobenzaprine  (FLEXERIL ) tablet 5 mg   acetaminophen  (TYLENOL ) 500 MG tablet    Sig: Take 2 tablets (1,000 mg total) by mouth every 6 (six) hours as needed for moderate pain (pain score 4-6), fever or headache.    Dispense:  60 tablet    Refill:  0   ondansetron  (ZOFRAN -ODT) 8 MG disintegrating tablet    Sig: Take 1 tablet (8 mg total) by mouth every 8 (eight) hours as needed for nausea.    Dispense:  20 tablet    Refill:  0   cyclobenzaprine  (FLEXERIL ) 5 MG tablet    Sig: Take 1 tablet (5 mg total) by mouth every 8 (eight) hours as needed.    Dispense:  30 tablet    Refill:  0   HDS. During  conversation, started having nausea and vomiting. Moved into a room and given IM phenergan  and collected bloodwork.  825p -  Re-evaluated at bedside, no longer nauseous. Given tylenol  and flexeril  for abdominal pain. Labs overall unremarkable. Patient comfortable with going home. All questions answered prior to discharge. Return precautions discussed in detail.  ASSESSMENT   1. [redacted] weeks gestation of pregnancy   2. Nausea and vomiting during pregnancy prior to [redacted] weeks gestation   3. Right sided abdominal pain     PLAN  Discharge home in stable condition with return precautions.  Follow up with OB as scheduled.    Allergies as of 02/01/2024   No Known Allergies      Medication List     TAKE these medications    acetaminophen  500 MG tablet Commonly known as: TYLENOL  Take 2 tablets (1,000 mg total) by mouth every 6 (six) hours as needed for moderate pain (pain score 4-6), fever or headache. What changed:  how much to take reasons to take this   cyclobenzaprine  5 MG tablet Commonly known as: FLEXERIL  Take 1 tablet (5 mg total) by mouth every 8 (eight) hours as needed.   Doxylamine -Pyridoxine  10-10 MG Tbec Take 1 tablet by mouth in the morning and at bedtime.   famotidine  20 MG tablet Commonly known as: PEPCID  Take 1 tablet (20 mg total) by mouth 2 (two) times daily.   ferrous sulfate  325 (65 FE) MG tablet Take 1 tablet (325 mg total) by mouth every other day.   ondansetron  8 MG disintegrating tablet Commonly known as: ZOFRAN -ODT Take 1 tablet (8 mg total) by mouth every 8 (eight) hours as needed for nausea.   promethazine  25 MG tablet Commonly known as: PHENERGAN  Take 25 mg by mouth every 6 (six) hours as needed for nausea or vomiting.   scopolamine  1 MG/3DAYS Commonly known as: TRANSDERM-SCOP Place 1 patch (1.5 mg total) onto the skin every 3 (three) days.   VITAMIN D PO Take by mouth.        Charlie Courts, MD  Family Medicine - Obstetrics Fellow

## 2024-02-06 ENCOUNTER — Other Ambulatory Visit: Payer: Self-pay | Admitting: *Deleted

## 2024-02-06 ENCOUNTER — Ambulatory Visit

## 2024-02-06 ENCOUNTER — Other Ambulatory Visit

## 2024-02-06 ENCOUNTER — Other Ambulatory Visit: Attending: Obstetrics and Gynecology

## 2024-02-06 DIAGNOSIS — O09292 Supervision of pregnancy with other poor reproductive or obstetric history, second trimester: Secondary | ICD-10-CM

## 2024-02-06 DIAGNOSIS — Z8751 Personal history of pre-term labor: Secondary | ICD-10-CM | POA: Diagnosis present

## 2024-02-06 DIAGNOSIS — Z8759 Personal history of other complications of pregnancy, childbirth and the puerperium: Secondary | ICD-10-CM | POA: Insufficient documentation

## 2024-02-06 DIAGNOSIS — O358XX0 Maternal care for other (suspected) fetal abnormality and damage, not applicable or unspecified: Secondary | ICD-10-CM

## 2024-02-06 DIAGNOSIS — Z3A18 18 weeks gestation of pregnancy: Secondary | ICD-10-CM

## 2024-02-06 NOTE — Progress Notes (Signed)
 MFM Consult Note  Christy Gonzalez is currently at [redacted]w[redacted]d. She was seen today for a detailed fetal anatomy scan due to a prior poor obstetrical history in January 2025 where she ruptured membranes and went into preterm labor at 22+ weeks requiring a classical C-section due to a fetus in the breech presentation.  She was evaluated in the MAU last week due to musculoskeletal pain and was prescribed acetaminophen  and Flexeril .  She denies any other problems in her current pregnancy.    She had a cell free DNA test earlier in her pregnancy which indicated a low risk for trisomy 60, 41, and 13. A female fetus is predicted.   Sonographic findings Single intrauterine pregnancy at 18w 6d. Fetal cardiac activity:  Observed and appears normal. Presentation: Cephalic. The views of the fetal anatomy were limited today due to her early gestational age.  There were no obvious fetal anomalies noted on today's exam. Fetal biometry shows the estimated fetal weight of 0 lb 10 oz,  275 grams (62%). Amniotic fluid: Within normal limits.  MVP: 4.9 cm. Placenta: Anterior. Adnexa: No abnormality visualized.  Her cervical length measured transvaginally was 5.3 cm long without any signs of funneling.    She was reassured that her risk of a preterm birth is low based on the normal cervical length visualized today.  The patient was informed that anomalies may be missed due to technical limitations. If the fetus is in a suboptimal position or maternal habitus is increased, visualization of the fetus in the maternal uterus may be impaired.  A follow-up exam was scheduled in 3 weeks for another transvaginal cervical length measurement and to reassess the views of the fetal anatomy.  Due to her history of a prior classical C-section, a repeat C-section should be scheduled at around 37 weeks.  The patient stated that all of her questions were answered.   A total of 20 minutes was spent counseling and coordinating the  care for this patient.  Greater than 50% of the time was spent in direct face-to-face contact.

## 2024-02-14 ENCOUNTER — Encounter (HOSPITAL_COMMUNITY): Payer: Self-pay | Admitting: Obstetrics and Gynecology

## 2024-02-14 ENCOUNTER — Inpatient Hospital Stay (HOSPITAL_COMMUNITY)

## 2024-02-14 ENCOUNTER — Inpatient Hospital Stay (HOSPITAL_COMMUNITY)
Admission: AD | Admit: 2024-02-14 | Discharge: 2024-02-14 | Disposition: A | Discharge status: Home / Self Care | Payer: Self-pay | Attending: Obstetrics and Gynecology | Admitting: Obstetrics and Gynecology

## 2024-02-14 DIAGNOSIS — D649 Anemia, unspecified: Secondary | ICD-10-CM

## 2024-02-14 DIAGNOSIS — Z3A20 20 weeks gestation of pregnancy: Secondary | ICD-10-CM

## 2024-02-14 DIAGNOSIS — R109 Unspecified abdominal pain: Secondary | ICD-10-CM

## 2024-02-14 DIAGNOSIS — R1031 Right lower quadrant pain: Secondary | ICD-10-CM | POA: Insufficient documentation

## 2024-02-14 DIAGNOSIS — O26899 Other specified pregnancy related conditions, unspecified trimester: Secondary | ICD-10-CM

## 2024-02-14 DIAGNOSIS — O219 Vomiting of pregnancy, unspecified: Secondary | ICD-10-CM | POA: Diagnosis not present

## 2024-02-14 DIAGNOSIS — O26892 Other specified pregnancy related conditions, second trimester: Secondary | ICD-10-CM | POA: Insufficient documentation

## 2024-02-14 DIAGNOSIS — O99012 Anemia complicating pregnancy, second trimester: Secondary | ICD-10-CM | POA: Insufficient documentation

## 2024-02-14 LAB — CBC WITH DIFFERENTIAL/PLATELET
Abs Immature Granulocytes: 0.04 K/uL (ref 0.00–0.07)
Basophils Absolute: 0 K/uL (ref 0.0–0.1)
Basophils Relative: 0 %
Eosinophils Absolute: 0 K/uL (ref 0.0–0.5)
Eosinophils Relative: 0 %
HCT: 28.8 % — ABNORMAL LOW (ref 36.0–46.0)
Hemoglobin: 9.7 g/dL — ABNORMAL LOW (ref 12.0–15.0)
Immature Granulocytes: 0 %
Lymphocytes Relative: 11 %
Lymphs Abs: 1 K/uL (ref 0.7–4.0)
MCH: 30.1 pg (ref 26.0–34.0)
MCHC: 33.7 g/dL (ref 30.0–36.0)
MCV: 89.4 fL (ref 80.0–100.0)
Monocytes Absolute: 0.4 K/uL (ref 0.1–1.0)
Monocytes Relative: 4 %
Neutro Abs: 7.5 K/uL (ref 1.7–7.7)
Neutrophils Relative %: 85 %
Platelets: 254 K/uL (ref 150–400)
RBC: 3.22 MIL/uL — ABNORMAL LOW (ref 3.87–5.11)
RDW: 12.7 % (ref 11.5–15.5)
WBC: 8.9 K/uL (ref 4.0–10.5)
nRBC: 0 % (ref 0.0–0.2)

## 2024-02-14 LAB — COMPREHENSIVE METABOLIC PANEL WITH GFR
ALT: 9 U/L (ref 0–44)
AST: 14 U/L — ABNORMAL LOW (ref 15–41)
Albumin: 3 g/dL — ABNORMAL LOW (ref 3.5–5.0)
Alkaline Phosphatase: 42 U/L (ref 38–126)
Anion gap: 11 (ref 5–15)
BUN: 6 mg/dL (ref 6–20)
CO2: 23 mmol/L (ref 22–32)
Calcium: 8.9 mg/dL (ref 8.9–10.3)
Chloride: 102 mmol/L (ref 98–111)
Creatinine, Ser: 0.55 mg/dL (ref 0.44–1.00)
GFR, Estimated: >60 mL/min (ref >60)
Glucose, Bld: 93 mg/dL (ref 70–99)
Potassium: 3.5 mmol/L (ref 3.5–5.1)
Sodium: 136 mmol/L (ref 135–145)
Total Bilirubin: 0.7 mg/dL (ref 0.0–1.2)
Total Protein: 6.5 g/dL (ref 6.5–8.1)

## 2024-02-14 LAB — URINALYSIS, ROUTINE W REFLEX MICROSCOPIC
Bilirubin Urine: NEGATIVE
Glucose, UA: NEGATIVE mg/dL
Hgb urine dipstick: NEGATIVE
Ketones, ur: 15 mg/dL — AB
Leukocytes,Ua: NEGATIVE
Nitrite: NEGATIVE
Protein, ur: 100 mg/dL — AB
Specific Gravity, Urine: 1.025 (ref 1.005–1.030)
pH: 6 (ref 5.0–8.0)

## 2024-02-14 LAB — URINALYSIS, MICROSCOPIC (REFLEX): Bacteria, UA: NONE SEEN

## 2024-02-14 MED ORDER — CYCLOBENZAPRINE HCL 5 MG PO TABS
10.0000 mg | ORAL_TABLET | Freq: Once | ORAL | Status: AC
  Administered 2024-02-14: 10 mg via ORAL
  Filled 2024-02-14: qty 2

## 2024-02-14 MED ORDER — HYDROMORPHONE HCL 1 MG/ML IJ SOLN
1.0000 mg | INTRAMUSCULAR | Status: DC | PRN
  Administered 2024-02-14: 1 mg via INTRAVENOUS
  Filled 2024-02-14: qty 1

## 2024-02-14 MED ORDER — OXYCODONE HCL 5 MG PO TABS
5.0000 mg | ORAL_TABLET | Freq: Once | ORAL | Status: AC
  Administered 2024-02-14: 5 mg via ORAL
  Filled 2024-02-14: qty 1

## 2024-02-14 MED ORDER — ACETAMINOPHEN 500 MG PO TABS
1000.0000 mg | ORAL_TABLET | Freq: Once | ORAL | Status: AC
  Administered 2024-02-14: 1000 mg via ORAL
  Filled 2024-02-14: qty 2

## 2024-02-14 MED ORDER — ONDANSETRON HCL 4 MG PO TABS: 4.0000 mg | ORAL_TABLET | Freq: Every day | ORAL | 1 refills | Status: DC | PRN

## 2024-02-14 MED ORDER — CYCLOBENZAPRINE HCL 5 MG PO TABS: 5.0000 mg | ORAL_TABLET | Freq: Three times a day (TID) | ORAL | 0 refills | Status: AC | PRN

## 2024-02-14 MED ORDER — OXYCODONE-ACETAMINOPHEN 5-325 MG PO TABS: 1.0000 | ORAL_TABLET | ORAL | 0 refills | Status: DC | PRN

## 2024-02-14 NOTE — MAU Note (Signed)
 Christy Gonzalez is a 28 y.o. at [redacted]w[redacted]d here in MAU reporting: EMS arrival. C/o severe RLQ pain that woke her up at 12:30 today. Has had n/v with the pain. Stated she was evaluated for this pain about month ago. Denies any vag discharge or bleeding.  Was given 500ccNS bolus and 4mg  zofran  iv by EMS.   LMP:  Onset of complaint: 1230pm Pain score: 8  Vitals:   02/14/24 1707  BP: 101/60  Pulse: 68  Resp: 18  Temp: 97.6 F (36.4 C)     FHT: 146  Lab orders placed from triage: u/a

## 2024-02-14 NOTE — MAU Provider Note (Addendum)
 None     [name] is a 28 y.o. [phone] pregnant female at 110w0d who presents to MAU today with complaint of worsening pain 8/10, that started again around 1230 today. Reports normal bowel and bladder habits. Reports bland intake today, maybe something fried or spicy yesterday.   Reports some heartburn, but none now.   Denies any bleeding in urine.   Tried Tylenol  and Flexeril  at home but vomited these both up.  Reports >3x vomiting today.   Receives care at [facility]. Prenatal records reviewed.  Pertinent items noted in HPI and remainder of comprehensive ROS otherwise negative.   O BP (!) 90/45 (BP Location: Right Arm)   Pulse 64   Temp 97.6 F (36.4 C)   Resp 18   LMP 09/27/2023  Physical Exam Vitals reviewed.  Constitutional:      Appearance: Normal appearance.  HENT:     Head: Normocephalic.  Cardiovascular:     Rate and Rhythm: Normal rate and regular rhythm.     Pulses: Normal pulses.     Heart sounds: Normal heart sounds.  Pulmonary:     Effort: Pulmonary effort is normal.     Breath sounds: Normal breath sounds.  Abdominal:     Tenderness: There is abdominal tenderness in the right upper quadrant, right lower quadrant and epigastric area. There is right CVA tenderness and guarding. There is no rebound. Negative signs include Murphy's sign and McBurney's sign.  Skin:    General: Skin is warm and dry.     Capillary Refill: Capillary refill takes less than 2 seconds.  Neurological:     General: No focal deficit present.     Mental Status: She is alert and oriented to person, place, and time.      MDM:  Moderate  MAU Course:  A Anemia, unspecified type  Nausea and vomiting during pregnancy  Abdominal pain complicating pregnancy  [redacted] weeks gestation of pregnancy  Medical screening exam complete  Ddx includes cholecystitis, appendicitis, nephrolithiasis, and musculoskeletal discomfort in pregnancy. CBC and CMP WNL other than anemia noted.   RUQ and renal US  ordered.  Low suspicion for appendicitis due to normal WBC and PE.   P US  results pending at sign out. Sign out to C. Pau Banh, MD at 2010 02/14/24.   Christy Rote, MSN, CNM 02/14/2024 10:24 PM  Certified Nurse Midwife, [facility]  No acute findings on abdominal US . Discussed CT scan for further workup. Patient declines at this time. Pain controlled with dilaudid . Extensive counseling on smoking cessation provided. Nausea currently resolved. Discussed differentials with patient and return precautions in detail. U/A with no evidence of infectious process. Short course of percocet sent to patient pharmacy. Labs wnl. Stable for discharge home.  Christy Angles, MD OB Fellow, Faculty Practice [facility], [facility] [employer]

## 2024-02-15 ENCOUNTER — Ambulatory Visit

## 2024-02-18 ENCOUNTER — Encounter (HOSPITAL_COMMUNITY): Payer: Self-pay | Admitting: Obstetrics and Gynecology

## 2024-02-18 ENCOUNTER — Inpatient Hospital Stay (HOSPITAL_COMMUNITY)

## 2024-02-18 ENCOUNTER — Inpatient Hospital Stay (HOSPITAL_COMMUNITY)
Admission: AD | Admit: 2024-02-18 | Discharge: 2024-02-18 | Disposition: A | Discharge status: Home / Self Care | Attending: Obstetrics and Gynecology | Admitting: Obstetrics and Gynecology

## 2024-02-18 ENCOUNTER — Other Ambulatory Visit: Payer: Self-pay

## 2024-02-18 DIAGNOSIS — R1031 Right lower quadrant pain: Secondary | ICD-10-CM

## 2024-02-18 DIAGNOSIS — O219 Vomiting of pregnancy, unspecified: Secondary | ICD-10-CM | POA: Diagnosis not present

## 2024-02-18 DIAGNOSIS — K5900 Constipation, unspecified: Secondary | ICD-10-CM

## 2024-02-18 DIAGNOSIS — Z3A20 20 weeks gestation of pregnancy: Secondary | ICD-10-CM

## 2024-02-18 DIAGNOSIS — K3580 Unspecified acute appendicitis: Secondary | ICD-10-CM | POA: Diagnosis not present

## 2024-02-18 DIAGNOSIS — O26893 Other specified pregnancy related conditions, third trimester: Secondary | ICD-10-CM | POA: Diagnosis not present

## 2024-02-18 DIAGNOSIS — O99612 Diseases of the digestive system complicating pregnancy, second trimester: Secondary | ICD-10-CM

## 2024-02-18 DIAGNOSIS — Z789 Other specified health status: Secondary | ICD-10-CM

## 2024-02-18 DIAGNOSIS — O26892 Other specified pregnancy related conditions, second trimester: Secondary | ICD-10-CM | POA: Diagnosis not present

## 2024-02-18 LAB — CBC WITH DIFFERENTIAL/PLATELET
Abs Immature Granulocytes: 0.03 K/uL (ref 0.00–0.07)
Basophils Absolute: 0 K/uL (ref 0.0–0.1)
Basophils Relative: 0 %
Eosinophils Absolute: 0 K/uL (ref 0.0–0.5)
Eosinophils Relative: 0 %
HCT: 27.2 % — ABNORMAL LOW (ref 36.0–46.0)
Hemoglobin: 9.4 g/dL — ABNORMAL LOW (ref 12.0–15.0)
Immature Granulocytes: 0 %
Lymphocytes Relative: 10 %
Lymphs Abs: 0.7 K/uL (ref 0.7–4.0)
MCH: 30 pg (ref 26.0–34.0)
MCHC: 34.6 g/dL (ref 30.0–36.0)
MCV: 86.9 fL (ref 80.0–100.0)
Monocytes Absolute: 0.4 K/uL (ref 0.1–1.0)
Monocytes Relative: 6 %
Neutro Abs: 6.3 K/uL (ref 1.7–7.7)
Neutrophils Relative %: 84 %
Platelets: 281 K/uL (ref 150–400)
RBC: 3.13 MIL/uL — ABNORMAL LOW (ref 3.87–5.11)
RDW: 12.4 % (ref 11.5–15.5)
WBC: 7.5 K/uL (ref 4.0–10.5)
nRBC: 0 % (ref 0.0–0.2)

## 2024-02-18 LAB — WET PREP, GENITAL
Clue Cells Wet Prep HPF POC: NONE SEEN
Sperm: NONE SEEN
Trich, Wet Prep: NONE SEEN
WBC, Wet Prep HPF POC: <10 (ref <10)
Yeast Wet Prep HPF POC: NONE SEEN

## 2024-02-18 LAB — COMPREHENSIVE METABOLIC PANEL WITH GFR
ALT: 8 U/L (ref 0–44)
AST: 10 U/L — ABNORMAL LOW (ref 15–41)
Albumin: 2.6 g/dL — ABNORMAL LOW (ref 3.5–5.0)
Alkaline Phosphatase: 61 U/L (ref 38–126)
Anion gap: 13 (ref 5–15)
BUN: <5 mg/dL — ABNORMAL LOW (ref 6–20)
CO2: 22 mmol/L (ref 22–32)
Calcium: 8.8 mg/dL — ABNORMAL LOW (ref 8.9–10.3)
Chloride: 101 mmol/L (ref 98–111)
Creatinine, Ser: 0.49 mg/dL (ref 0.44–1.00)
GFR, Estimated: >60 mL/min (ref >60)
Glucose, Bld: 100 mg/dL — ABNORMAL HIGH (ref 70–99)
Potassium: 3.4 mmol/L — ABNORMAL LOW (ref 3.5–5.1)
Sodium: 136 mmol/L (ref 135–145)
Total Bilirubin: 0.5 mg/dL (ref 0.0–1.2)
Total Protein: 6.5 g/dL (ref 6.5–8.1)

## 2024-02-18 LAB — URINALYSIS, ROUTINE W REFLEX MICROSCOPIC
Glucose, UA: NEGATIVE mg/dL
Hgb urine dipstick: NEGATIVE
Ketones, ur: 80 mg/dL — AB
Nitrite: NEGATIVE
Protein, ur: >=300 mg/dL — AB
Specific Gravity, Urine: 1.037 — ABNORMAL HIGH (ref 1.005–1.030)
pH: 5 (ref 5.0–8.0)

## 2024-02-18 MED ORDER — CYCLOBENZAPRINE HCL 5 MG PO TABS
10.0000 mg | ORAL_TABLET | Freq: Once | ORAL | Status: AC
  Administered 2024-02-18: 10 mg via ORAL
  Filled 2024-02-18: qty 2

## 2024-02-18 MED ORDER — ACETAMINOPHEN 500 MG PO TABS
1000.0000 mg | ORAL_TABLET | Freq: Once | ORAL | Status: AC
  Administered 2024-02-18: 1000 mg via ORAL
  Filled 2024-02-18: qty 2

## 2024-02-18 MED ORDER — IOHEXOL 350 MG/ML SOLN
75.0000 mL | Freq: Once | INTRAVENOUS | Status: AC | PRN
  Administered 2024-02-18: 75 mL via INTRAVENOUS

## 2024-02-18 MED ORDER — SODIUM CHLORIDE 0.9 % IV SOLN
25.0000 mg | Freq: Once | INTRAVENOUS | Status: AC
  Administered 2024-02-18: 25 mg via INTRAVENOUS
  Filled 2024-02-18: qty 1

## 2024-02-18 MED ORDER — POLYETHYLENE GLYCOL 3350 17 GM/SCOOP PO POWD: 17.0000 g | Freq: Every day | ORAL | 2 refills | Status: DC | PRN

## 2024-02-18 MED ORDER — SMOG ENEMA
960.0000 mL | Freq: Once | RECTAL | Status: AC
  Administered 2024-02-18: 960 mL via RECTAL
  Filled 2024-02-18: qty 960

## 2024-02-18 MED ORDER — LACTATED RINGERS IV BOLUS
1000.0000 mL | Freq: Once | INTRAVENOUS | Status: AC
  Administered 2024-02-18: 1000 mL via INTRAVENOUS

## 2024-02-18 MED ORDER — PROMETHAZINE HCL 25 MG PO TABS: 25.0000 mg | ORAL_TABLET | Freq: Four times a day (QID) | ORAL | 2 refills | Status: AC | PRN

## 2024-02-18 NOTE — Discharge Instructions (Addendum)
 You have constipation which is hard stools that are difficult to pass. It is important to have regular bowel movements every 1-3 days that are soft and easy to pass. Hard stools increase your risk of hemorrhoids and can be very painful. Try to use Tylenol  and Flexeril  for pain on a schedule and use Percocet ONLY for severe pain occasionally. Percocet can worsen constipation.  To prevent constipation you can increase your water intake and the amount of fiber in your diet. Examples of foods with fiber are leafy greens, whole grain breads, oatmeal and other grains. Try to drink at least eight 8oz glass of water everyday.   Restart your iron  supplement on Mondays, Wednesdays, Fridays once you are having regular bowel movements.  Miralax  Clean out Take 8 capfuls of miralax  in 64 oz of gatorade. You can use any fluid that appeals to you (gatorade, water, juice) Continue to drink at least eight 8 oz glasses of water throughout the day You can repeat with another 8 capfuls of miralax  in 64 oz of gatorade if you are not having a large amount of stools You will need to be at home and close to a bathroom for about 8 hours when you do the above as you may need to go to the bathroom frequently.   After you are cleaned out: - Drink 64-100 ounces of fluid each day - Start Miralax  once daily OR Colace 100 mg twice daily - Start a daily fiber supplement like metamucil or citrucel - You can safely use enemas in pregnancy  - If you are having diarrhea you can reduce to Colace once a day or Miralax  every other day or a 1/2 capful daily.

## 2024-02-18 NOTE — MAU Note (Signed)
 Christy Gonzalez is a 28 y.o. at [redacted]w[redacted]d here in MAU reporting: lower, right abdominal and back pain that she describes as intermittent and pressure. Patient denies LOF or vaginal bleeding. Patient reports feeling fetal movement.   EDC:  07/03/2024 Onset of complaint: 02/12/24 Pain score: 8 Vitals:   02/18/24 0737  BP: (!) 102/55  Pulse: 78  Resp: 16  Temp: 97.6 F (36.4 C)  SpO2: 100%     FHT: 140

## 2024-02-18 NOTE — MAU Provider Note (Signed)
 Chief Complaint:  Abdominal Pain and Back Pain   HPI   None     Christy Gonzalez is a 28 y.o. [id number] at [redacted]w[redacted]d who presents to maternity admissions reporting abdominal pain. The pain is located in the RLQ. The pain is described as intermittent pressure-like, and is 8/10 in intensity when it occurs. Onset was 6 days ago. Symptoms have been gradually worsening since. At home, she has been taking Percocet as prescribed. She also has Zofran  for nausea. She is not taking any stool softener, last bowel movement was at least 4 days ago. She denies vaginal bleeding, leaking of fluid. Endorses fetal movement. She was seen in the MAU on 11/27 for right sided abdominal pain and back pain, workup negative and prescribed Tylenol  and Flexeril  for MSK pain. She was seen in the MAU again on 12/10 for the same, nausea/vomiting prevented taking Tylenol  and Flexeril  at home at the time. Workup again without abnormalities including abdominal US . Discussed CT scan for further workup, patient declined at that time.   Pregnancy Course: Receives care at [facility]. Prenatal records reviewed. She has a history of PPROM at 60 weeks January of 2025. Seen by MFM on 02/06/2024, cervical length 5.3 cm with low risk for preterm birth.  Past Medical History:  Diagnosis Date   Anemia    Iron  deficiency    Spontaneous vaginal delivery 05/21/2015   OB History  Gravida Para Term Preterm AB Living  5 4 3 1  3   SAB IAB Ectopic Multiple Live Births     0 4    # Outcome Date GA Lbr Len/2nd Weight Sex Type Anes PTL Lv  5 Current           4 Preterm 03/14/23 [redacted]w[redacted]d    CS-Classical  Y ND     Complications: Premature rupture of membranes  3 Term 07/10/18 [redacted]w[redacted]d 00:40 / 00:11 2860 g M Vag-Spont EPI  LIV  2 Term 05/20/15 [redacted]w[redacted]d 07:50 / 00:03 2620 g F Vag-Spont None  LIV  1 Term 07/30/10    F Vag-Spont   LIV   Past Surgical History:  Procedure Laterality Date   CESAREAN SECTION     Family History  Problem Relation Age of  Onset   Hypertension Mother    Fibroids Mother    Social History[1] Allergies[2] Medications Prior to Admission  Medication Sig Dispense Refill Last Dose/Taking   cyclobenzaprine  (FLEXERIL ) 5 MG tablet Take 1 tablet (5 mg total) by mouth 3 (three) times daily as needed. 30 tablet 0 02/17/2024 at  4:00 PM   ondansetron  (ZOFRAN ) 4 MG tablet Take 1 tablet (4 mg total) by mouth daily as needed for nausea or vomiting. 30 tablet 1 02/18/2024 at  4:00 AM   oxyCODONE -acetaminophen  (PERCOCET) 5-325 MG tablet Take 1 tablet by mouth every 4 (four) hours as needed for severe pain (pain score 7-10). 20 tablet 0 02/18/2024 at  4:00 AM   Prenatal Vit-Fe Fumarate-FA (MULTIVITAMIN-PRENATAL) 27-0.8 MG TABS tablet Take 1 tablet by mouth daily at 12 noon.   Past Week   ferrous sulfate  325 (65 FE) MG tablet Take 1 tablet (325 mg total) by mouth every other day. (Patient not taking: Reported on 01/18/2024) 45 tablet 0     I have reviewed patient's Past Medical Hx, Surgical Hx, Family Hx, Social Hx, medications and allergies.   ROS  Pertinent items noted in HPI and remainder of comprehensive ROS otherwise negative.   PHYSICAL EXAM  Patient Vitals for the  past 24 hrs:  BP Temp Temp src Pulse Resp SpO2  02/18/24 0859 105/62 -- -- 64 18 --  02/18/24 0855 -- -- -- -- -- 100 %  02/18/24 0748 -- -- -- -- -- 100 %  02/18/24 0737 (!) 102/55 97.6 F (36.4 C) Oral 78 16 100 %  FHT: 140  Constitutional: Well-developed, well-nourished female in no acute distress.  HEENT: atraumatic, normocephalic. Neck has normal ROM. EOM intact. Cardiovascular: normal rate & rhythm, warm and well-perfused Respiratory: normal effort, no problems with respiration noted GI: Abd soft, non-tender, non-distended MSK: Extremities nontender, no edema, normal ROM Skin: warm and dry. Acyanotic, no jaundice or pallor. Neurologic: Alert and oriented x 4. No abnormal coordination. Psychiatric: Normal mood. Speech not slurred, not  rapid/pressured. Patient is cooperative. GU: no CVA tenderness Dilation: Closed Effacement (%): Thick Station: Ballotable Exam by:: Hickox, PA Exam chaperoned by Hunter Stanley RN  Labs: Results for orders placed or performed during the hospital encounter of 02/18/24 (from the past 24 hours)  Urinalysis, Routine w reflex microscopic -Urine, Clean Catch     Status: Abnormal   Collection Time: 02/18/24  7:54 AM  Result Value Ref Range   Color, Urine AMBER (A) YELLOW   APPearance CLOUDY (A) CLEAR   Specific Gravity, Urine 1.037 (H) 1.005 - 1.030   pH 5.0 5.0 - 8.0   Glucose, UA NEGATIVE NEGATIVE mg/dL   Hgb urine dipstick NEGATIVE NEGATIVE   Bilirubin Urine SMALL (A) NEGATIVE   Ketones, ur 80 (A) NEGATIVE mg/dL   Protein, ur >=699 (A) NEGATIVE mg/dL   Nitrite NEGATIVE NEGATIVE   Leukocytes,Ua TRACE (A) NEGATIVE   RBC / HPF 0-5 0 - 5 RBC/hpf   WBC, UA 6-10 0 - 5 WBC/hpf   Bacteria, UA RARE (A) NONE SEEN   Squamous Epithelial / HPF 6-10 0 - 5 /HPF   Mucus PRESENT   CBC with Differential/Platelet     Status: Abnormal   Collection Time: 02/18/24  8:22 AM  Result Value Ref Range   WBC 7.5 4.0 - 10.5 K/uL   RBC 3.13 (L) 3.87 - 5.11 MIL/uL   Hemoglobin 9.4 (L) 12.0 - 15.0 g/dL   HCT 72.7 (L) 63.9 - 53.9 %   MCV 86.9 80.0 - 100.0 fL   MCH 30.0 26.0 - 34.0 pg   MCHC 34.6 30.0 - 36.0 g/dL   RDW 87.5 88.4 - 84.4 %   Platelets 281 150 - 400 K/uL   nRBC 0.0 0.0 - 0.2 %   Neutrophils Relative % 84 %   Neutro Abs 6.3 1.7 - 7.7 K/uL   Lymphocytes Relative 10 %   Lymphs Abs 0.7 0.7 - 4.0 K/uL   Monocytes Relative 6 %   Monocytes Absolute 0.4 0.1 - 1.0 K/uL   Eosinophils Relative 0 %   Eosinophils Absolute 0.0 0.0 - 0.5 K/uL   Basophils Relative 0 %   Basophils Absolute 0.0 0.0 - 0.1 K/uL   Immature Granulocytes 0 %   Abs Immature Granulocytes 0.03 0.00 - 0.07 K/uL  Comprehensive metabolic panel     Status: Abnormal   Collection Time: 02/18/24  8:22 AM  Result Value Ref Range    Sodium 136 135 - 145 mmol/L   Potassium 3.4 (L) 3.5 - 5.1 mmol/L   Chloride 101 98 - 111 mmol/L   CO2 22 22 - 32 mmol/L   Glucose, Bld 100 (H) 70 - 99 mg/dL   BUN <5 (L) 6 - 20 mg/dL   Creatinine,  Ser 0.49 0.44 - 1.00 mg/dL   Calcium  8.8 (L) 8.9 - 10.3 mg/dL   Total Protein 6.5 6.5 - 8.1 g/dL   Albumin 2.6 (L) 3.5 - 5.0 g/dL   AST 10 (L) 15 - 41 U/L   ALT 8 0 - 44 U/L   Alkaline Phosphatase 61 38 - 126 U/L   Total Bilirubin 0.5 0.0 - 1.2 mg/dL   GFR, Estimated >39 >39 mL/min   Anion gap 13 5 - 15  Wet prep, genital     Status: None   Collection Time: 02/18/24  8:51 AM  Result Value Ref Range   Yeast Wet Prep HPF POC NONE SEEN NONE SEEN   Trich, Wet Prep NONE SEEN NONE SEEN   Clue Cells Wet Prep HPF POC NONE SEEN NONE SEEN   WBC, Wet Prep HPF POC <10 <10   Sperm NONE SEEN     Imaging:  CT ABDOMEN PELVIS W CONTRAST Result Date: 02/18/2024 EXAM: CT ABDOMEN AND PELVIS WITH CONTRAST 02/18/2024 10:52:46 AM TECHNIQUE: CT of the abdomen and pelvis was performed with the administration of intravenous contrast. Multiplanar reformatted images are provided for review. Automated exposure control, iterative reconstruction, and/or weight-based adjustment of the mA/kV was utilized to reduce the radiation dose to as low as reasonably achievable. 75 mL iohexol  (OMNIPAQUE ) 350 MG/ML injection was administered. COMPARISON: Abdomen ultrasound 02/14/2024. MRI abdomen and pelvis 02/27/2023. CLINICAL HISTORY: 28 year old female pregnant in the 2nd trimester with right lower quadrant and back pain. FINDINGS: LOWER CHEST: Visible lower chest is within normal limits for the pregnancy state. LIVER: Trace free fluid is also possible just inferior to the right liver tip such as on coronal image 51. GALLBLADDER AND BILE DUCTS: Gallbladder is unremarkable. No biliary ductal dilatation. SPLEEN: No acute abnormality. PANCREAS: No acute abnormality. ADRENAL GLANDS: No acute abnormality. KIDNEYS, URETERS AND BLADDER: No  stones in the kidneys or ureters. No hydronephrosis. No perinephric or periureteral stranding. Urinary bladder is unremarkable. GI AND BOWEL: Stomach demonstrates no acute abnormality. Small volume retained fluid in the stomach which appears negative. Decompressed and negative duodenum. Nondilated small bowel throughout the abdomen. Compressed rectosigmoid colon. Redundant junction of the descending and sigmoid in the left lower quadrant containing gas, otherwise decompressed descending colon. Gas filled but nondilated transverse colon. In the right abdomen there is confluent inflammation surrounding the cecum (coronal image 47). No normal appendix is identified. On series 3 image 40 and coronal image 54, there is a thickened and hyperenhancing tubular structure suspicious for an inflamed abnormal appendix, up to 12 mm diameter. It appears to be looping 180 degrees, but the appendix architecture is difficult to delineate. These changes are inseparable from the right ovary, with enlarged and enhancing gonadal vessels visible on those images, and indeterminate multilocular appearing rim enhancing fluid in the region such as on series 3 image 44 (largest such focus is 2 cm diameter). The terminal ileum emerging medially on coronal image 47 is partially thickened and inflamed, but does not seem to be the epicenter. No bowel obstruction. PERITONEUM AND RETROPERITONEUM: No pneumoperitoneum identified. No convincing free fluid. Trace free fluid is also possible just inferior to the right liver tip such as on coronal image 51. VASCULATURE: Aorta is normal in caliber. Major arterial structures and the portal venous system in the abdomen and pelvis are patent. LYMPH NODES: No lymphadenopathy. REPRODUCTIVE ORGANS: Gravid uterus. The right ovary is involved in the inflammatory process in the right lower quadrant, with enlarged and enhancing gonadal vessels visible  on series 3 image 40 and coronal image 54. Indeterminate  multilocular appearing rim enhancing fluid in the region, such as on series 3 image 44 (largest such focus is 2 cm diameter), is inseparable from the right ovary. BONES AND SOFT TISSUES: No acute osseous abnormality. No focal soft tissue abnormality. IMPRESSION: 1. Confluent inflammation at the cecum, with appearance highly suspicious for dilated and inflamed gas-less appendix - but also inseparable from the right ovary. Multilocular small rim enhancing fluid collections there (up to 2 cm) indeterminate for perforated appendicitis with abscess versus ovarian cysts. No pneumoperitoneum identified. And the terminal ileum is also partially inflamed. 2. No bowel obstruction. 3. Gravid uterus. Electronically signed by: Helayne Hurst MD 02/18/2024 11:44 AM EST RP Workstation: [id number]    MDM & MAU COURSE  MDM: Moderate  MAU Course: -Vital signs stable. -UA, GC/CT, wet prep to rule out infection. -Repeat CBC and CMP to rule out appendicitis, hepatobiliary process. -Discussed CT imaging again given ongoing RLQ pain. Patient is agreeable. -Last medication Zofran  and Percocet around 0400. Will give IV fluids and repeat Tylenol . -Urine dipstick shows positive for protein, positive for trace leukocytes, positive for small urobilinogen, and positive for ketones.  Micro exam: rare bacteria. Will send culture. -Wet prep negative. -CBC with ongoing anemia. Recommend restarting iron  supplement along with MiraLax  to treat and prevent constipation. WBC within normal limits. -CMP with very mild hypokalemia, likely due to vomiting. Albumin has decreased some since last visit. Will send home with alternative nausea medication so patient can maintain adequate PO intake of protein and fluids at home. -CT read notes difficulty differentiating appendix and right ovary, indeterminate for perforated appendicitis with abscess versus ovarian cysts. No bowel obstruction but high stool burden. WBC within normal limits and patient  remains afebrile. Discussed with Dr. Tanda with General Surgery. Given 2 week history of RLQ pain, normal WBC, and no fever low suspicion for appendicitis.  -Discussed patient history, physical exam, labs, imaging, and general surgery consult with Dr. Lola. He and Dr. Herchel have low suspicion for PID in pregnancy, will offer enema for constipation. -Patient agreeable to trial of enema in MAU before discharge. She does not need to work tomorrow and can do a MiraLax  clean out at home. -Given history of preterm delivery at 22 weeks, checking cervix. Cervix is closed and thick. -After enema, RLQ pain slightly improved but back is hurting. She has not taken Flexeril  today, will give dose before discharge home.  Differential diagnosis considered for lower abdominal pain includes but is not limited to: round ligament pain, UTI, pyelonephritis, PID, cervicitis, appendicitis, diverticulitis, constipation, nephrolithiasis  Orders Placed This Encounter  Procedures   Wet prep, genital   Culture, OB Urine   CT ABDOMEN PELVIS W CONTRAST   Urinalysis, Routine w reflex microscopic -Urine, Clean Catch   CBC with Differential/Platelet   Comprehensive metabolic panel   Discharge patient   Meds ordered this encounter  Medications   lactated ringers  bolus 1,000 mL   acetaminophen  (TYLENOL ) tablet 1,000 mg   promethazine  (PHENERGAN ) 25 mg in sodium chloride  0.9 % 50 mL IVPB   iohexol  (OMNIPAQUE ) 350 MG/ML injection 75 mL   sorbitol , magnesium hydroxide, mineral oil, glycerin  (SMOG) enema   polyethylene glycol powder (GLYCOLAX /MIRALAX ) 17 GM/SCOOP powder    Sig: Take 17 g by mouth daily as needed for moderate constipation.    Dispense:  850 g    Refill:  2   promethazine  (PHENERGAN ) 25 MG tablet    Sig: Take  1 tablet (25 mg total) by mouth every 6 (six) hours as needed for nausea or vomiting.    Dispense:  30 tablet    Refill:  2   cyclobenzaprine  (FLEXERIL ) tablet 10 mg    ASSESSMENT   1.  Constipation during pregnancy in second trimester   2. Nausea and vomiting during pregnancy   3. RLQ abdominal pain   4. Afebrile   5. [redacted] weeks gestation of pregnancy     PLAN  Discharge home in stable condition with return precautions.  Recommend MiraLax  clean out, then MiraLax  daily to maintain regular bowel movements.  Discussed that Percocet and Zofran  can be constipating. Sent prescriptions for Phenergan  and Reglan as alternatives for nausea. Recommend Tylenol  and Flexeril  as mainstay of pain control, reserving Percocet for severe breakthrough pain. No refill of narcotic medication today. Restart iron  supplement on Mondays, Wednesdays, Fridays once having regular bowel movements.    Allergies as of 02/18/2024   No Known Allergies      Medication List     STOP taking these medications    ondansetron  4 MG tablet Commonly known as: Zofran    oxyCODONE -acetaminophen  5-325 MG tablet Commonly known as: Percocet       TAKE these medications    cyclobenzaprine  5 MG tablet Commonly known as: FLEXERIL  Take 1 tablet (5 mg total) by mouth 3 (three) times daily as needed.   ferrous sulfate  325 (65 FE) MG tablet Take 1 tablet (325 mg total) by mouth every other day.   multivitamin-prenatal 27-0.8 MG Tabs tablet Take 1 tablet by mouth daily at 12 noon.   polyethylene glycol powder 17 GM/SCOOP powder Commonly known as: GLYCOLAX /MIRALAX  Take 17 g by mouth daily as needed for moderate constipation.   promethazine  25 MG tablet Commonly known as: PHENERGAN  Take 1 tablet (25 mg total) by mouth every 6 (six) hours as needed for nausea or vomiting.        Joesph DELENA Sear, PA      [1]  Social History Tobacco Use   Smoking status: Former    Types: Cigars   Smokeless tobacco: Never   Tobacco comments:    <5 cigars a day  Vaping Use   Vaping status: Never Used  Substance Use Topics   Alcohol use: No   Drug use: Not Currently    Frequency: 2.0 times per week     Types: Marijuana    Comment: yesterday 02/13/24  [2] No Known Allergies

## 2024-02-19 ENCOUNTER — Observation Stay (HOSPITAL_COMMUNITY): Admitting: Anesthesiology

## 2024-02-19 ENCOUNTER — Inpatient Hospital Stay (HOSPITAL_COMMUNITY)
Admission: AD | Admit: 2024-02-19 | Discharge: 2024-02-26 | DRG: 817 | Disposition: A | Discharge status: Home / Self Care | Payer: Self-pay | Attending: General Surgery | Admitting: General Surgery

## 2024-02-19 ENCOUNTER — Encounter (HOSPITAL_COMMUNITY): Admission: AD | Disposition: A | Payer: Self-pay | Source: Home / Self Care

## 2024-02-19 ENCOUNTER — Telehealth: Payer: Self-pay | Admitting: Obstetrics and Gynecology

## 2024-02-19 ENCOUNTER — Telehealth: Payer: Self-pay | Admitting: General Surgery

## 2024-02-19 ENCOUNTER — Encounter (HOSPITAL_COMMUNITY): Payer: Self-pay | Admitting: Obstetrics and Gynecology

## 2024-02-19 ENCOUNTER — Other Ambulatory Visit: Payer: Self-pay

## 2024-02-19 DIAGNOSIS — K3532 Acute appendicitis with perforation and localized peritonitis, without abscess: Secondary | ICD-10-CM | POA: Diagnosis present

## 2024-02-19 DIAGNOSIS — K650 Generalized (acute) peritonitis: Secondary | ICD-10-CM

## 2024-02-19 DIAGNOSIS — O99282 Endocrine, nutritional and metabolic diseases complicating pregnancy, second trimester: Secondary | ICD-10-CM | POA: Diagnosis not present

## 2024-02-19 DIAGNOSIS — O26892 Other specified pregnancy related conditions, second trimester: Secondary | ICD-10-CM | POA: Diagnosis not present

## 2024-02-19 DIAGNOSIS — Z8249 Family history of ischemic heart disease and other diseases of the circulatory system: Secondary | ICD-10-CM

## 2024-02-19 DIAGNOSIS — Z9889 Other specified postprocedural states: Secondary | ICD-10-CM

## 2024-02-19 DIAGNOSIS — Z3492 Encounter for supervision of normal pregnancy, unspecified, second trimester: Secondary | ICD-10-CM

## 2024-02-19 DIAGNOSIS — O99612 Diseases of the digestive system complicating pregnancy, second trimester: Principal | ICD-10-CM | POA: Diagnosis present

## 2024-02-19 DIAGNOSIS — K3580 Unspecified acute appendicitis: Secondary | ICD-10-CM | POA: Diagnosis not present

## 2024-02-19 DIAGNOSIS — Z87891 Personal history of nicotine dependence: Secondary | ICD-10-CM

## 2024-02-19 DIAGNOSIS — Z3A20 20 weeks gestation of pregnancy: Secondary | ICD-10-CM | POA: Diagnosis not present

## 2024-02-19 DIAGNOSIS — K5900 Constipation, unspecified: Secondary | ICD-10-CM | POA: Diagnosis present

## 2024-02-19 DIAGNOSIS — K35211 Acute appendicitis with generalized peritonitis, with perforation and abscess: Secondary | ICD-10-CM | POA: Diagnosis present

## 2024-02-19 DIAGNOSIS — K567 Ileus, unspecified: Secondary | ICD-10-CM | POA: Diagnosis not present

## 2024-02-19 DIAGNOSIS — E876 Hypokalemia: Secondary | ICD-10-CM | POA: Diagnosis not present

## 2024-02-19 DIAGNOSIS — Z634 Disappearance and death of family member: Secondary | ICD-10-CM

## 2024-02-19 DIAGNOSIS — K9189 Other postprocedural complications and disorders of digestive system: Secondary | ICD-10-CM | POA: Diagnosis not present

## 2024-02-19 DIAGNOSIS — Z79899 Other long term (current) drug therapy: Secondary | ICD-10-CM

## 2024-02-19 HISTORY — PX: LAPAROSCOPY: SHX197

## 2024-02-19 HISTORY — PX: COLOSTOMY REVISION: SHX5232

## 2024-02-19 LAB — HIV ANTIBODY (ROUTINE TESTING W REFLEX): HIV Screen 4th Generation wRfx: NONREACTIVE

## 2024-02-19 LAB — GC/CHLAMYDIA PROBE AMP (~~LOC~~) NOT AT ARMC
Chlamydia: NEGATIVE
Comment: NEGATIVE
Comment: NORMAL
Neisseria Gonorrhea: NEGATIVE

## 2024-02-19 LAB — CULTURE, OB URINE: Culture: NO GROWTH

## 2024-02-19 SURGERY — LAPAROSCOPY, DIAGNOSTIC
Anesthesia: General | Site: Abdomen | Laterality: Right

## 2024-02-19 MED ORDER — SCOPOLAMINE 1 MG/3DAYS TD PT72: 1.0000 | MEDICATED_PATCH | TRANSDERMAL | Status: DC

## 2024-02-19 MED ORDER — ACETAMINOPHEN 10 MG/ML IV SOLN
INTRAVENOUS | Status: DC | PRN
  Administered 2024-02-19: 15:00:00 1000 mg via INTRAVENOUS

## 2024-02-19 MED ORDER — DEXMEDETOMIDINE HCL IN NACL 80 MCG/20ML IV SOLN
INTRAVENOUS | Status: DC | PRN
  Administered 2024-02-19 (×2): 10 ug via INTRAVENOUS
  Administered 2024-02-19: 17:00:00 8 ug via INTRAVENOUS
  Administered 2024-02-19: 17:00:00 12 ug via INTRAVENOUS

## 2024-02-19 MED ORDER — ONDANSETRON HCL 4 MG/2ML IJ SOLN
INTRAMUSCULAR | Status: DC | PRN
  Administered 2024-02-19: 16:00:00 4 mg via INTRAVENOUS

## 2024-02-19 MED ORDER — CHLORHEXIDINE GLUCONATE 0.12 % MT SOLN
OROMUCOSAL | Status: AC
  Administered 2024-02-19: 14:00:00 15 mL via OROMUCOSAL
  Filled 2024-02-19: qty 15

## 2024-02-19 MED ORDER — BUPIVACAINE-EPINEPHRINE (PF) 0.25% -1:200000 IJ SOLN
INTRAMUSCULAR | Status: AC
  Filled 2024-02-19: qty 30

## 2024-02-19 MED ORDER — PROPOFOL 10 MG/ML IV BOLUS
INTRAVENOUS | Status: AC
  Filled 2024-02-19: qty 20

## 2024-02-19 MED ORDER — ORAL CARE MOUTH RINSE: 15.0000 mL | Freq: Once | OROMUCOSAL | Status: AC

## 2024-02-19 MED ORDER — HYDROMORPHONE HCL 1 MG/ML IJ SOLN
0.5000 mg | INTRAMUSCULAR | Status: DC | PRN
  Administered 2024-02-19 – 2024-02-20 (×4): 0.5 mg via INTRAVENOUS
  Filled 2024-02-19 (×4): qty 0.5

## 2024-02-19 MED ORDER — 0.9 % SODIUM CHLORIDE (POUR BTL) OPTIME
TOPICAL | Status: DC | PRN
  Administered 2024-02-19: 16:00:00 1000 mL

## 2024-02-19 MED ORDER — DEXAMETHASONE SOD PHOSPHATE PF 10 MG/ML IJ SOLN
INTRAMUSCULAR | Status: DC | PRN
  Administered 2024-02-19: 16:00:00 10 mg via INTRAVENOUS

## 2024-02-19 MED ORDER — PROMETHAZINE (PHENERGAN) 6.25MG IN NS 50ML IVPB: 6.2500 mg | Freq: Four times a day (QID) | INTRAVENOUS | Status: DC | PRN

## 2024-02-19 MED ORDER — HYDROMORPHONE HCL 1 MG/ML IJ SOLN
INTRAMUSCULAR | Status: AC
  Filled 2024-02-19: qty 0.5

## 2024-02-19 MED ORDER — PROPOFOL 500 MG/50ML IV EMUL
INTRAVENOUS | Status: DC | PRN
  Administered 2024-02-19: 15:00:00 100 ug/kg/min via INTRAVENOUS

## 2024-02-19 MED ORDER — BUPIVACAINE-EPINEPHRINE 0.25% -1:200000 IJ SOLN
INTRAMUSCULAR | Status: DC | PRN
  Administered 2024-02-19: 16:00:00 3 mL

## 2024-02-19 MED ORDER — SCOPOLAMINE 1 MG/3DAYS TD PT72
MEDICATED_PATCH | TRANSDERMAL | Status: AC
  Filled 2024-02-19: qty 1

## 2024-02-19 MED ORDER — CHLORHEXIDINE GLUCONATE 0.12 % MT SOLN: 15.0000 mL | Freq: Once | OROMUCOSAL | Status: AC

## 2024-02-19 MED ORDER — LACTATED RINGERS IV SOLN: INTRAVENOUS | Status: DC

## 2024-02-19 MED ORDER — LIDOCAINE 2% (20 MG/ML) 5 ML SYRINGE
INTRAMUSCULAR | Status: AC
  Filled 2024-02-19: qty 5

## 2024-02-19 MED ORDER — FENTANYL CITRATE (PF) 250 MCG/5ML IJ SOLN
INTRAMUSCULAR | Status: DC | PRN
  Administered 2024-02-19 (×2): 50 ug via INTRAVENOUS
  Administered 2024-02-19: 15:00:00 100 ug via INTRAVENOUS
  Administered 2024-02-19: 15:00:00 50 ug via INTRAVENOUS
  Administered 2024-02-19: 16:00:00 100 ug via INTRAVENOUS

## 2024-02-19 MED ORDER — ONDANSETRON HCL 4 MG/2ML IJ SOLN: 4.0000 mg | Freq: Once | INTRAMUSCULAR | Status: DC | PRN

## 2024-02-19 MED ORDER — PROPOFOL 10 MG/ML IV BOLUS
INTRAVENOUS | Status: DC | PRN
  Administered 2024-02-19: 15:00:00 180 mg via INTRAVENOUS

## 2024-02-19 MED ORDER — ACETAMINOPHEN 10 MG/ML IV SOLN
INTRAVENOUS | Status: AC
  Filled 2024-02-19: qty 100

## 2024-02-19 MED ORDER — MIDAZOLAM HCL 2 MG/2ML IJ SOLN
INTRAMUSCULAR | Status: AC
  Filled 2024-02-19: qty 2

## 2024-02-19 MED ORDER — OXYCODONE HCL 5 MG PO TABS: 5.0000 mg | ORAL_TABLET | Freq: Once | ORAL | Status: DC | PRN

## 2024-02-19 MED ORDER — HYDROMORPHONE HCL 1 MG/ML IJ SOLN
INTRAMUSCULAR | Status: DC | PRN
  Administered 2024-02-19 (×3): .5 mg via INTRAVENOUS

## 2024-02-19 MED ORDER — DIPHENHYDRAMINE HCL 50 MG/ML IJ SOLN: 25.0000 mg | Freq: Four times a day (QID) | INTRAMUSCULAR | Status: DC | PRN

## 2024-02-19 MED ORDER — SODIUM CHLORIDE 0.9 % IR SOLN
Status: DC | PRN
  Administered 2024-02-19: 16:00:00 1000 mL

## 2024-02-19 MED ORDER — LIDOCAINE 2% (20 MG/ML) 5 ML SYRINGE
INTRAMUSCULAR | Status: DC | PRN
  Administered 2024-02-19: 15:00:00 50 mg via INTRAVENOUS

## 2024-02-19 MED ORDER — NEOSTIGMINE METHYLSULFATE 3 MG/3ML IV SOSY
PREFILLED_SYRINGE | INTRAVENOUS | Status: DC | PRN
  Administered 2024-02-19: 17:00:00 3 mg via INTRAVENOUS

## 2024-02-19 MED ORDER — HYDROMORPHONE HCL 1 MG/ML IJ SOLN
INTRAMUSCULAR | Status: AC
  Filled 2024-02-19: qty 1

## 2024-02-19 MED ORDER — ATROPINE SULFATE 0.4 MG/ML IV SOLN
INTRAVENOUS | Status: DC | PRN
  Administered 2024-02-19: 17:00:00 .8 mg via INTRAVENOUS

## 2024-02-19 MED ORDER — FENTANYL CITRATE (PF) 100 MCG/2ML IJ SOLN
INTRAMUSCULAR | Status: AC
  Filled 2024-02-19: qty 2

## 2024-02-19 MED ORDER — HYDROMORPHONE HCL 1 MG/ML IJ SOLN
0.2500 mg | INTRAMUSCULAR | Status: DC | PRN
  Administered 2024-02-19 (×3): 0.5 mg via INTRAVENOUS

## 2024-02-19 MED ORDER — PROCHLORPERAZINE MALEATE 10 MG PO TABS
10.0000 mg | ORAL_TABLET | Freq: Four times a day (QID) | ORAL | Status: DC | PRN
  Filled 2024-02-19: qty 1

## 2024-02-19 MED ORDER — ROCURONIUM BROMIDE 10 MG/ML (PF) SYRINGE
PREFILLED_SYRINGE | INTRAVENOUS | Status: AC
  Filled 2024-02-19: qty 10

## 2024-02-19 MED ORDER — SODIUM CHLORIDE 0.9 % IV SOLN
2.0000 g | INTRAVENOUS | Status: DC
  Administered 2024-02-19 – 2024-02-25 (×7): 2 g via INTRAVENOUS
  Filled 2024-02-19 (×8): qty 20

## 2024-02-19 MED ORDER — METRONIDAZOLE 500 MG/100ML IV SOLN
500.0000 mg | Freq: Two times a day (BID) | INTRAVENOUS | Status: DC
  Administered 2024-02-19 – 2024-02-20 (×3): 500 mg via INTRAVENOUS
  Filled 2024-02-19 (×4): qty 100

## 2024-02-19 MED ORDER — OXYCODONE HCL 5 MG PO TABS
5.0000 mg | ORAL_TABLET | ORAL | Status: DC | PRN
  Administered 2024-02-19 – 2024-02-20 (×3): 10 mg via ORAL
  Administered 2024-02-20: 23:00:00 5 mg via ORAL
  Administered 2024-02-20 – 2024-02-21 (×4): 10 mg via ORAL
  Administered 2024-02-22 – 2024-02-26 (×5): 5 mg via ORAL
  Filled 2024-02-19: qty 1
  Filled 2024-02-19: qty 2
  Filled 2024-02-19: qty 1
  Filled 2024-02-19: qty 2
  Filled 2024-02-19 (×2): qty 1
  Filled 2024-02-19 (×6): qty 2
  Filled 2024-02-19 (×3): qty 1

## 2024-02-19 MED ORDER — PROCHLORPERAZINE EDISYLATE 10 MG/2ML IJ SOLN
5.0000 mg | Freq: Four times a day (QID) | INTRAMUSCULAR | Status: DC | PRN
  Administered 2024-02-20: 06:00:00 10 mg via INTRAVENOUS
  Administered 2024-02-22: 11:00:00 5 mg via INTRAVENOUS
  Administered 2024-02-23: 10 mg via INTRAVENOUS
  Filled 2024-02-19 (×4): qty 2

## 2024-02-19 MED ORDER — ROCURONIUM BROMIDE 10 MG/ML (PF) SYRINGE
PREFILLED_SYRINGE | INTRAVENOUS | Status: DC | PRN
  Administered 2024-02-19: 16:00:00 10 mg via INTRAVENOUS
  Administered 2024-02-19: 15:00:00 30 mg via INTRAVENOUS
  Administered 2024-02-19 (×2): 10 mg via INTRAVENOUS

## 2024-02-19 MED ORDER — FENTANYL CITRATE (PF) 250 MCG/5ML IJ SOLN
INTRAMUSCULAR | Status: AC
  Filled 2024-02-19: qty 5

## 2024-02-19 MED ORDER — SODIUM CHLORIDE 0.9 % IV SOLN: INTRAVENOUS | Status: DC

## 2024-02-19 MED ORDER — OXYCODONE HCL 5 MG/5ML PO SOLN: 5.0000 mg | Freq: Once | ORAL | Status: DC | PRN

## 2024-02-19 MED ORDER — SUCCINYLCHOLINE CHLORIDE 200 MG/10ML IV SOSY
PREFILLED_SYRINGE | INTRAVENOUS | Status: DC | PRN
  Administered 2024-02-19: 15:00:00 120 mg via INTRAVENOUS

## 2024-02-19 MED ORDER — ACETAMINOPHEN 500 MG PO TABS
1000.0000 mg | ORAL_TABLET | Freq: Four times a day (QID) | ORAL | Status: DC
  Administered 2024-02-19 – 2024-02-26 (×21): 1000 mg via ORAL
  Filled 2024-02-19 (×23): qty 2

## 2024-02-19 SURGICAL SUPPLY — 60 items
BAG COUNTER SPONGE SURGICOUNT (BAG) ×2 IMPLANT
BIOPATCH RED 1 DISK 7.0 (GAUZE/BANDAGES/DRESSINGS) IMPLANT
BLADE SURG 10 STRL SS (BLADE) IMPLANT
CANISTER SUCTION 3000ML PPV (SUCTIONS) ×2 IMPLANT
CHLORAPREP W/TINT 26 (MISCELLANEOUS) ×2 IMPLANT
CLIP APPLIE 5 13 M/L LIGAMAX5 (MISCELLANEOUS) IMPLANT
COVER SURGICAL LIGHT HANDLE (MISCELLANEOUS) ×2 IMPLANT
CUTTER ECHEON FLEX ENDO 45 340 (ENDOMECHANICALS) ×2 IMPLANT
DERMABOND ADVANCED .7 DNX12 (GAUZE/BANDAGES/DRESSINGS) ×2 IMPLANT
DRAIN CHANNEL 19F RND (DRAIN) IMPLANT
DRAPE HALF SHEET 40X57 (DRAPES) IMPLANT
DRAPE UTILITY 15X26 TOWEL STRL (DRAPES) IMPLANT
DRAPE WARM FLUID 44X44 (DRAPES) IMPLANT
DRSG MEPILEX POST OP 4X8 (GAUZE/BANDAGES/DRESSINGS) IMPLANT
DRSG TEGADERM 4X10 (GAUZE/BANDAGES/DRESSINGS) IMPLANT
ELECT CAUTERY BLADE 6.4 (BLADE) IMPLANT
ELECTRODE BLDE 4.0 EZ CLN MEGD (MISCELLANEOUS) IMPLANT
ELECTRODE REM PT RTRN 9FT ADLT (ELECTROSURGICAL) ×2 IMPLANT
EVACUATOR SILICONE 100CC (DRAIN) IMPLANT
GLOVE BIO SURGEON STRL SZ7 (GLOVE) ×2 IMPLANT
GLOVE BIOGEL PI IND STRL 7.5 (GLOVE) ×2 IMPLANT
GOWN STRL REUS W/ TWL LRG LVL3 (GOWN DISPOSABLE) ×6 IMPLANT
GRASPER SUT TROCAR 14GX15 (MISCELLANEOUS) ×2 IMPLANT
HANDLE SUCTION POOLE (INSTRUMENTS) IMPLANT
IRRIGATION SUCT STRKRFLW 2 WTP (MISCELLANEOUS) ×2 IMPLANT
KIT BASIN OR (CUSTOM PROCEDURE TRAY) ×2 IMPLANT
KIT TURNOVER KIT B (KITS) ×2 IMPLANT
LIGASURE IMPACT 36 18CM CVD LR (INSTRUMENTS) IMPLANT
PAD ARMBOARD POSITIONER FOAM (MISCELLANEOUS) ×4 IMPLANT
PENCIL SMOKE EVACUATOR (MISCELLANEOUS) IMPLANT
POUCH RETRIEVAL ECOSAC 10 (ENDOMECHANICALS) ×2 IMPLANT
RELOAD STAPLE 45 3.6 BLU REG (STAPLE) ×2 IMPLANT
RELOAD STAPLE 75 3.8 BLU REG (ENDOMECHANICALS) IMPLANT
SCISSORS LAP 5X35 DISP (ENDOMECHANICALS) IMPLANT
SET TUBE SMOKE EVAC HIGH FLOW (TUBING) ×2 IMPLANT
SHEARS HARMONIC 36 ACE (MISCELLANEOUS) ×2 IMPLANT
SLEEVE Z-THREAD 5X100MM (TROCAR) ×2 IMPLANT
SOLN 0.9% NACL POUR BTL 1000ML (IV SOLUTION) ×2 IMPLANT
SOLN STERILE WATER BTL 1000 ML (IV SOLUTION) ×2 IMPLANT
SPONGE T-LAP 18X36 ~~LOC~~+RFID STR (SPONGE) IMPLANT
STAPLER GUN LINEAR PROX 60 (STAPLE) IMPLANT
STAPLER PROXIMATE 75MM BLUE (STAPLE) IMPLANT
STAPLER SKIN PROX 35W (STAPLE) IMPLANT
STRIP CLOSURE SKIN 1/2X4 (GAUZE/BANDAGES/DRESSINGS) ×2 IMPLANT
SUT MNCRL AB 4-0 PS2 18 (SUTURE) ×2 IMPLANT
SUT PDS AB 1 TP1 96 (SUTURE) IMPLANT
SUT SILK 2 0 SH CR/8 (SUTURE) IMPLANT
SUT SILK 2 0 TIES 10X30 (SUTURE) IMPLANT
SUT SILK 3 0 SH CR/8 (SUTURE) IMPLANT
SUT SILK 3 0 TIES 10X30 (SUTURE) IMPLANT
SUT VICRYL 0 UR6 27IN ABS (SUTURE) ×2 IMPLANT
TOWEL GREEN STERILE (TOWEL DISPOSABLE) ×2 IMPLANT
TOWEL GREEN STERILE FF (TOWEL DISPOSABLE) ×2 IMPLANT
TRAY FOLEY MTR SLVR 16FR STAT (SET/KITS/TRAYS/PACK) IMPLANT
TRAY LAPAROSCOPIC MC (CUSTOM PROCEDURE TRAY) ×2 IMPLANT
TROCAR BALLN 12MMX100 BLUNT (TROCAR) ×2 IMPLANT
TROCAR Z-THREAD OPTICAL 5X100M (TROCAR) ×2 IMPLANT
TUBE CONNECTING 12X1/4 (SUCTIONS) IMPLANT
WARMER LAPAROSCOPE (MISCELLANEOUS) ×2 IMPLANT
YANKAUER SUCT BULB TIP NO VENT (SUCTIONS) IMPLANT

## 2024-02-19 NOTE — H&P (Signed)
 Roselyn L Golson Jul 02, 1995  [id number].    Requesting MD: Jayne, MD Chief Complaint/Reason for Consult: possible appendicitis   HPI:  Christy Gonzalez is a 28 y/o pregnant female [redacted]w[redacted]d who presents with right sided abdominal pain.  Reports the pain started about 2 weeks ago and is located in her right lower abdomen and her back.  Pain is worse with movement and coughing.  Pain is not relieved by Tylenol  or oxycodone .  She reports that shortly before the pain started she had emesis that has been persistent, intermittent.  She was not having issues with nausea and vomiting prior to this.  Also endorses constipation.  She denies fever, chills, urinary symptoms, or known history of STI.  She tells me that until 2 weeks ago she was smoking cigarettes and marijuana.  Denies IV drug use or cocaine use.  At baseline she lives at home with her 3 children, the oldest of which is 45 and the youngest of which is 5.  She is tearful.  She reports having a C-section in January of this year due to preterm labor at 22 weeks.  The baby did not survive.  She adds that shortly after she lost her baby her mother died just days later.  She denies any other support system  ROS: Review of Systems  All other systems reviewed and are negative.   Family History  Problem Relation Age of Onset   Hypertension Mother    Fibroids Mother     Past Medical History:  Diagnosis Date   Anemia    Iron  deficiency    Spontaneous vaginal delivery 05/21/2015    Past Surgical History:  Procedure Laterality Date   CESAREAN SECTION      Social History:  reports that she has quit smoking. Her smoking use included cigars. She has never used smokeless tobacco. She reports that she does not currently use drugs after having used the following drugs: Marijuana. Frequency: 2.00 times per week. She reports that she does not drink alcohol.  Allergies: Allergies[1]  Medications Prior to Admission  Medication Sig Dispense Refill    acetaminophen  (TYLENOL ) 500 MG tablet Take 500 mg by mouth every 6 (six) hours as needed.     cyclobenzaprine  (FLEXERIL ) 5 MG tablet Take 1 tablet (5 mg total) by mouth 3 (three) times daily as needed. 30 tablet 0   ferrous sulfate  325 (65 FE) MG tablet Take 1 tablet (325 mg total) by mouth every other day. (Patient not taking: Reported on 01/18/2024) 45 tablet 0   polyethylene glycol powder (GLYCOLAX /MIRALAX ) 17 GM/SCOOP powder Take 17 g by mouth daily as needed for moderate constipation. 850 g 2   Prenatal Vit-Fe Fumarate-FA (MULTIVITAMIN-PRENATAL) 27-0.8 MG TABS tablet Take 1 tablet by mouth daily at 12 noon.     promethazine  (PHENERGAN ) 25 MG tablet Take 1 tablet (25 mg total) by mouth every 6 (six) hours as needed for nausea or vomiting. 30 tablet 2     Physical Exam: Blood pressure 101/60, pulse 79, temperature 99 F (37.2 C), temperature source Oral, resp. rate 18, last menstrual period 09/27/2023, SpO2 100%, unknown if currently breastfeeding. General: Tearful black female in no acute distress HEENT: head -normocephalic, atraumatic; Eyes: PERRLA, no conjunctival injection; anicteric sclerae Neck- Trachea is midline CV- RRR, normal S1/S2, no M/R/G, no lower extremity edema Pulm- breathing is non-labored on room air Abd- soft, uterus palpable around the umbilicus, tender to palpation in the right lower quadrant and right mid abdomen, palpation of  the left lower quadrant elicits mild discomfort in the right lower quadrant, she may have a small umbilical hernia that is soft. GU- deferred  MSK- UE/LE symmetrical, no cyanosis, clubbing, or edema. Neuro- CN II-XII grossly in tact, no paresthesias. Psych- Alert and Oriented x3 with appropriate affect Skin: warm and dry, no rashes or lesions   Results for orders placed or performed during the hospital encounter of 02/18/24 (from the past 48 hours)  Urinalysis, Routine w reflex microscopic -Urine, Clean Catch     Status: Abnormal    Collection Time: 02/18/24  7:54 AM  Result Value Ref Range   Color, Urine AMBER (A) YELLOW    Comment: BIOCHEMICALS MAY BE AFFECTED BY COLOR   APPearance CLOUDY (A) CLEAR   Specific Gravity, Urine 1.037 (H) 1.005 - 1.030   pH 5.0 5.0 - 8.0   Glucose, UA NEGATIVE NEGATIVE mg/dL   Hgb urine dipstick NEGATIVE NEGATIVE   Bilirubin Urine SMALL (A) NEGATIVE   Ketones, ur 80 (A) NEGATIVE mg/dL   Protein, ur >=699 (A) NEGATIVE mg/dL   Nitrite NEGATIVE NEGATIVE   Leukocytes,Ua TRACE (A) NEGATIVE   RBC / HPF 0-5 0 - 5 RBC/hpf   WBC, UA 6-10 0 - 5 WBC/hpf   Bacteria, UA RARE (A) NONE SEEN   Squamous Epithelial / HPF 6-10 0 - 5 /HPF   Mucus PRESENT     Comment: Performed at [facility], [address], [place], [place] [address]  Culture, MAINE Urine     Status: None   Collection Time: 02/18/24  7:54 AM   Specimen: OB Clean Catch; Urine  Result Value Ref Range   Specimen Description OB CLEAN CATCH    Special Requests NONE    Culture      NO GROWTH NO GROUP B STREP (S.AGALACTIAE) ISOLATED Performed at [facility], [address] [place], [place] [address]    Report Status 02/19/2024 FINAL   CBC with Differential/Platelet     Status: Abnormal   Collection Time: 02/18/24  8:22 AM  Result Value Ref Range   WBC 7.5 4.0 - 10.5 K/uL   RBC 3.13 (L) 3.87 - 5.11 MIL/uL   Hemoglobin 9.4 (L) 12.0 - 15.0 g/dL   HCT 72.7 (L) 63.9 - 53.9 %   MCV 86.9 80.0 - 100.0 fL   MCH 30.0 26.0 - 34.0 pg   MCHC 34.6 30.0 - 36.0 g/dL   RDW 87.5 88.4 - 84.4 %   Platelets 281 150 - 400 K/uL   nRBC 0.0 0.0 - 0.2 %   Neutrophils Relative % 84 %   Neutro Abs 6.3 1.7 - 7.7 K/uL   Lymphocytes Relative 10 %   Lymphs Abs 0.7 0.7 - 4.0 K/uL   Monocytes Relative 6 %   Monocytes Absolute 0.4 0.1 - 1.0 K/uL   Eosinophils Relative 0 %   Eosinophils Absolute 0.0 0.0 - 0.5 K/uL   Basophils Relative 0 %   Basophils Absolute 0.0 0.0 - 0.1 K/uL   Immature Granulocytes 0 %   Abs Immature Granulocytes 0.03 0.00  - 0.07 K/uL    Comment: Performed at [facility], [address] [place], [place] [address]  Comprehensive metabolic panel     Status: Abnormal   Collection Time: 02/18/24  8:22 AM  Result Value Ref Range   Sodium 136 135 - 145 mmol/L   Potassium 3.4 (L) 3.5 - 5.1 mmol/L   Chloride 101 98 - 111 mmol/L  CO2 22 22 - 32 mmol/L   Glucose, Bld 100 (H) 70 - 99 mg/dL    Comment: Glucose reference range applies only to samples taken after fasting for at least 8 hours.   BUN <5 (L) 6 - 20 mg/dL   Creatinine, Ser 9.50 0.44 - 1.00 mg/dL   Calcium  8.8 (L) 8.9 - 10.3 mg/dL   Total Protein 6.5 6.5 - 8.1 g/dL   Albumin 2.6 (L) 3.5 - 5.0 g/dL   AST 10 (L) 15 - 41 U/L   ALT 8 0 - 44 U/L   Alkaline Phosphatase 61 38 - 126 U/L   Total Bilirubin 0.5 0.0 - 1.2 mg/dL   GFR, Estimated >39 >39 mL/min    Comment: (NOTE) Calculated using the CKD-EPI Creatinine Equation (2021)    Anion gap 13 5 - 15    Comment: Performed at [facility], [address], [place], [place] [address]  Wet prep, genital     Status: None   Collection Time: 02/18/24  8:51 AM  Result Value Ref Range   Yeast Wet Prep HPF POC NONE SEEN NONE SEEN   Trich, Wet Prep NONE SEEN NONE SEEN   Clue Cells Wet Prep HPF POC NONE SEEN NONE SEEN   WBC, Wet Prep HPF POC <10 <10   Sperm NONE SEEN     Comment: Performed at [facility], [address] [place], [place] [address]   CT ABDOMEN PELVIS W CONTRAST Result Date: 02/18/2024 EXAM: CT ABDOMEN AND PELVIS WITH CONTRAST 02/18/2024 10:52:46 AM TECHNIQUE: CT of the abdomen and pelvis was performed with the administration of intravenous contrast. Multiplanar reformatted images are provided for review. Automated exposure control, iterative reconstruction, and/or weight-based adjustment of the mA/kV was utilized to reduce the radiation dose to as low as reasonably achievable. 75 mL iohexol  (OMNIPAQUE ) 350 MG/ML injection was administered. COMPARISON: Abdomen ultrasound  02/14/2024. MRI abdomen and pelvis 02/27/2023. CLINICAL HISTORY: 28 year old female pregnant in the 2nd trimester with right lower quadrant and back pain. FINDINGS: LOWER CHEST: Visible lower chest is within normal limits for the pregnancy state. LIVER: Trace free fluid is also possible just inferior to the right liver tip such as on coronal image 51. GALLBLADDER AND BILE DUCTS: Gallbladder is unremarkable. No biliary ductal dilatation. SPLEEN: No acute abnormality. PANCREAS: No acute abnormality. ADRENAL GLANDS: No acute abnormality. KIDNEYS, URETERS AND BLADDER: No stones in the kidneys or ureters. No hydronephrosis. No perinephric or periureteral stranding. Urinary bladder is unremarkable. GI AND BOWEL: Stomach demonstrates no acute abnormality. Small volume retained fluid in the stomach which appears negative. Decompressed and negative duodenum. Nondilated small bowel throughout the abdomen. Compressed rectosigmoid colon. Redundant junction of the descending and sigmoid in the left lower quadrant containing gas, otherwise decompressed descending colon. Gas filled but nondilated transverse colon. In the right abdomen there is confluent inflammation surrounding the cecum (coronal image 47). No normal appendix is identified. On series 3 image 40 and coronal image 54, there is a thickened and hyperenhancing tubular structure suspicious for an inflamed abnormal appendix, up to 12 mm diameter. It appears to be looping 180 degrees, but the appendix architecture is difficult to delineate. These changes are inseparable from the right ovary, with enlarged and enhancing gonadal vessels visible on those images, and indeterminate multilocular appearing rim enhancing fluid in the region such as on series 3 image 44 (largest such focus is 2 cm diameter). The terminal ileum emerging medially on coronal image 47 is partially thickened and  inflamed, but does not seem to be the epicenter. No bowel obstruction. PERITONEUM AND  RETROPERITONEUM: No pneumoperitoneum identified. No convincing free fluid. Trace free fluid is also possible just inferior to the right liver tip such as on coronal image 51. VASCULATURE: Aorta is normal in caliber. Major arterial structures and the portal venous system in the abdomen and pelvis are patent. LYMPH NODES: No lymphadenopathy. REPRODUCTIVE ORGANS: Gravid uterus. The right ovary is involved in the inflammatory process in the right lower quadrant, with enlarged and enhancing gonadal vessels visible on series 3 image 40 and coronal image 54. Indeterminate multilocular appearing rim enhancing fluid in the region, such as on series 3 image 44 (largest such focus is 2 cm diameter), is inseparable from the right ovary. BONES AND SOFT TISSUES: No acute osseous abnormality. No focal soft tissue abnormality. IMPRESSION: 1. Confluent inflammation at the cecum, with appearance highly suspicious for dilated and inflamed gas-less appendix - but also inseparable from the right ovary. Multilocular small rim enhancing fluid collections there (up to 2 cm) indeterminate for perforated appendicitis with abscess versus ovarian cysts. No pneumoperitoneum identified. And the terminal ileum is also partially inflamed. 2. No bowel obstruction. 3. Gravid uterus. Electronically signed by: Helayne Hurst MD 02/18/2024 11:44 AM EST RP Workstation: [id number]      Assessment/Plan 28 year old female [redacted] weeks pregnant with likely perforated appendicitis with abscess Patient is hemodynamically stable.  She is afebrile, no leukocytosis.  History of 2 weeks of right sided abdominal pain associated with vomiting and constipation.  CT scan of the abdomen pelvis performed yesterday shows cecal inflammation, dilated appendix without gas, small fluid collections in that area.  Discussed with OB/GYN Dr. Jayne who feels unilateral tubo-ovarian abscess in the setting of active pregnancy is very unlikely.  While I think it is unusual for her  to be afebrile and not have leukocytosis I agree that her history and exam are consistent with appendicitis.  Will discuss with Dr. Ebbie to confirm pelvic ultrasound is not indicated to definitively exclude TOA.  Likely needs laparoscopic appendectomy today.   FEN -NPO, IVF VTE -SCDs ID - Rocephin /Flagyl  Admit -OB specialty unit is very full, nurse midwife asked if he would be able to take her on the MedSurg floor.  I feel like this is probably appropriate if she gets an appendectomy, would just need OB team to follow/perform perioperative fetal monitoring.  I reviewed nursing notes, hospitalist notes, last 24 h vitals and pain scores, last 48 h intake and output, last 24 h labs and trends, and last 24 h imaging results.  Almarie GORMAN Pringle, PA-C [facility] 02/19/2024, 11:27 AM Please see Amion for pager number during day hours 7:00am-4:30pm or 7:00am -11:30am on weekends     [1] No Known Allergies

## 2024-02-19 NOTE — Telephone Encounter (Signed)
 I was contacted on Sunday by the PA in the MAU regarding a female who is [redacted] weeks pregnant who had had 2 weeks of right lower quadrant pain, no fever or tachycardia or leukocytosis.  I was in surgery during the phone call and therefore had not personally reviewed the imaging or the chart.  They describe the CT showing inflammation in the right abdomen with possible appendicitis versus ovarian etiology since the ovary was also near the area of inflammation in the right abdomen with questionable abscess.  I asked them to get a pelvic ultrasound to get a better look at the adnexal structures to see if that could help delineate the area better  to try determine if TOA vs appendicitis and to call me back.  I realized that I had not heard back from them and I reviewed the imaging this morning and it is concerning for possible appendicitis with abscess but she needs a formal exam.  Therefore I contacted the [facility] attending for the MAU and asked them to contact the patient and bring her back to the MAU for general surgery to evaluate the patient  Christy Gonzalez. Tanda, MD, FACS General, Bariatric, & Minimally Invasive Surgery [facility],  [facility]

## 2024-02-19 NOTE — Op Note (Signed)
 Preoperative diagnosis: perforated appendicitis Postoperative diagnosis: perforated appendicitis with purulent peritonitis Procedure: 1.  Diagnostic laparoscopy 2.  Exploratory laparotomy with right colectomy Surgeon: Dr. Adina Bury Anesthesia: General Specimens: Right colon to pathology Complications: None Drains: 19 French Blake drain to right lower quadrant Sponge needle count correct completion Estimated blood loss: Less than 50 cc Disposition to recovery stable issue  Indications: This is a 28 year old female who is [redacted] weeks pregnant whose had right sided pain for about 2 weeks.  She has been unable to walk sometimes during this period.  She was evaluated yesterday and underwent a CT scan that showed possible appendicitis.  She was called back and returned today which is when I saw her.  I was concerned with her exam and her history as well as her pregnancy that she need to go to the operating room for appendectomy.  Procedure: After informed consent was obtained she was taken to the operating room.  We discussed all of the risks including fetal loss.  She was already on antibiotics.  SCDs were placed.  She was placed under general anesthesia without complication.  She was prepped and draped in a standard sterile surgical fashion.  A surgical timeout was then performed.  She had a Foley catheter in place.  I made a small incision above the umbilicus.  I carried this up to the fascia.  I entered the fascia sharply.  I then placed a 0 Vicryl pursestring suture through the fascia and the peritoneum was entered bluntly without injury.  I then insufflated the abdomen to 15 mmHg pressure.  I then placed 2 additional 5 mm trocars in the upper abdomen.  She had a large inflammatory process in the right lower quadrant.  There was purulence throughout the right side of her abdomen included above her liver.  I was able to remove the purulence above her liver as well as in her gallbladder fossa in  her gutter.  I spent some time trying to free up her colon and her appendix.  She had a large abscess cavity that I drained that was immediately involved with the right adnexa.  This was widely drained.  I then realized that there was really no appendix but there was purulence coming from the ileocecal region.  To control the disease the only real way to do this would be to do an ileocecectomy or right colectomy.  Dr.Eure of OB/GYN came in and was agreeable to this plan as well.  I then elected to make a a upper midline incision.  I was then able to mobilize her right colon.  I took care to avoid the duodenum.  I then was able to divide the transverse colon at an appropriate site with a GIA stapler.  I did the same with the small bowel.  I then used the LigaSure device as well as some suture ligatures to divide the mesentery and remove the entire right colon.  It was clear that there was no way to do this besides a right colectomy as the cecum and much of the right colon was involved with inflammatory process and an anastomosis needed to be placed to the transverse colon.  I then brought the small bowel next to the transverse colon.  I repaired the defect with 2-0 silk suture.  I then made enterotomies in both.  I created an anastomosis with a GIA stapler and closed the common enterotomy with a TX stapler.  I oversewed a couple of small bleeders on the  staple line with 3-0 silk suture.  I then placed two 3-0 silk apex sutures.  The anastomosis was patent.  It was not bleeding.  I placed this in the abdomen.  I did some irrigation in the right lower quadrant.  I then placed a 19 French Blake drain.  I ran the bowel in its entirety and the remainder was normal.  The abscess cavity was completely opened but I did not feel that removing any of the adnexa certainly was indicated at this point.  I then changed to a clean tray.  We closed with #1 looped PDS and staples.  She was then transferred to recovery room where she  will have her fetal heart tones checked again.  I discussed this with her sister as well.

## 2024-02-19 NOTE — MAU Provider Note (Signed)
 None     S [name] is a 28 y.o. [phone] pregnant female at [redacted]w[redacted]d who presents to MAU today with complaint of abdominal pain in pregnancy. She was worked up yesterday in MAU with CT imaging. We received notification from General Surgery today that she may be a candidate for surgery. She has been seen in MAU for right-sided abdominal pain twice now, she initially declined CT 02/14/24.  She reports current symptoms of pain that is worse with movement and coughing and she has been unable to yell or raise her voice due to pain.  She endorses constipation and received treatment for this in MAU with limited success. No nausea or vomiting.  Her history is significant for preterm CS in January of this year and subsequent fetal demise.   Receives care at [facility]. Prenatal records reviewed.  Pertinent items noted in HPI and remainder of comprehensive ROS otherwise negative.   O BP 108/64   Pulse 86   Temp 98.6 F (37 C) (Oral)   Resp 18   LMP 09/27/2023   SpO2 100%  Physical Exam Vitals reviewed.  Constitutional:      General: She is not in acute distress.    Appearance: Normal appearance. She is well-developed. She is ill-appearing. She is not toxic-appearing or diaphoretic.  HENT:     Head: Normocephalic.  Cardiovascular:     Rate and Rhythm: Normal rate and regular rhythm.     Pulses: Normal pulses.     Heart sounds: Normal heart sounds.  Pulmonary:     Effort: Pulmonary effort is normal.     Breath sounds: Normal breath sounds.  Skin:    General: Skin is warm and dry.     Capillary Refill: Capillary refill takes less than 2 seconds.  Neurological:     General: No focal deficit present.     Mental Status: She is alert and oriented to person, place, and time.  Psychiatric:        Mood and Affect: Mood normal.        Behavior: Behavior normal.      MDM:  Moderate  MAU Course:  A Acute appendicitis, unspecified acute appendicitis type  Medical screening exam  complete  P Care turned over to General Surgery after consultation with their service.  Call to A Jones at 1144 to inform her of plan of care and admission of [facility] patient.   Camie Rote, MSN, CNM 02/19/2024 2:42 PM  Certified Nurse Midwife, [facility]

## 2024-02-19 NOTE — Transfer of Care (Signed)
 Immediate Anesthesia Transfer of Care Note  Patient: Christy Gonzalez  Procedure(s) Performed: LAPAROSCOPY, DIAGNOSTIC (Abdomen) COLECTOMY, RIGHT (Right: Abdomen)  Patient Location: PACU  Anesthesia Type:General  Level of Consciousness: awake, oriented, and drowsy  Airway & Oxygen Therapy: Patient Spontanous Breathing and Patient connected to nasal cannula oxygen  Post-op Assessment: Report given to RN, Post -op Vital signs reviewed and stable, and Patient moving all extremities  Post vital signs: Reviewed and stable  Last Vitals:  Vitals Value Taken Time  BP 110/69 02/19/24 17:10  Temp    Pulse 65 02/19/24 17:12  Resp 17 02/19/24 17:12  SpO2 100 % 02/19/24 17:12  Vitals shown include unfiled device data.  Last Pain:  Vitals:   02/19/24 1324  TempSrc: Oral  PainSc: 8          Complications: No notable events documented.

## 2024-02-19 NOTE — Consult Note (Signed)
 I was called to OR 1 by Dr Ebbie for intra operative consultation She is [redacted]w[redacted]d Estimated Date of Delivery: 07/03/24 taken to surgery for suspected appendicitis  At the time of the consult Dr Ebbie had dissected out the right adnexa from the ileocecal area and there was a large amount of pus Definitive appendix cannot be located He wanted my opinion regarding primary etiology vs secondary surrounding tissue involvement We discussed the findings together for several minutes and settled on a likely appendix rupture at some point in the past 2 weeks of the patient's symptoms which involved the right adnexa secondarily Dr Ebbie dissected the right tube and ovary out of the area and I agreed the tube and ovary did not need to be removed.  However, we both came to agree that the distal right colon/cecum/appendix (which again could not be definitively identified) complex required removal.  As a result Dr Ebbie is going to do a ileocecectomy and reanastomosis to the transverse colon beyond the hepatic flexure to remove all of the primarily involved tissue.  We will recommend daily Dopplers at this point and I will communicate with our RR nurse to that effect.   Vonn VEAR Inch, MD 02/19/2024 4:36 PM  Please call [phone] [facility]N Consult Attending Monday-Friday 8am - 5pm) or [phone] [facility] OB/[facility]N Attending On Call all day, every day) for ongoing concerns at any time.

## 2024-02-19 NOTE — Progress Notes (Signed)
 [facility]HR post op 135 by doppler in PACU

## 2024-02-19 NOTE — Telephone Encounter (Signed)
 Was called by general surgeon on call. Notified that there was some level of miscommunication with team yesterday and that requested imaging had not been completed. CT reviewed by general surgery and is concerning for acute appendicitis - she needs to be called back to MAU for gen surg evaluation.   I called patient's listed number x 2 with no answer, but was able to contact her via her sister (listed emergency contact). She is doing OK but has persistent pain. I reviewed the above and apologized. I recommend that she remains NPO and returns to MAU this morning so that we can complete her evaluation. Reviewed possibility of (but not guarantee of) abdominal surgery which is ideally performed in second trimester when indicated. Her sister explained that with her prior pregnancy loss and the loss of her mother earlier this year, they are understandably very worried and upset about her being discharged yesterday. They plan to come through MAU this morning.   MAU & general surgery teams notified.   Kieth Carolin, MD Obstetrician & Gynecologist, [facility] for [employer], [facility]

## 2024-02-19 NOTE — Progress Notes (Signed)
 FHR preop 168 with doppler

## 2024-02-19 NOTE — MAU Note (Signed)
 MAU Triage Note  Christy Gonzalez is a 28 y.o. at [redacted]w[redacted]d here in MAU reporting: she was called by a provider to come & by seen as she was seen here yesterday; right lower abd pain & back Onset of complaint: 2 weeks Pain score: right lower abd 5/10 back 5/10 Vitals:   02/19/24 1014  BP: 118/67  Pulse: 93  Resp: 18  SpO2: 100%     FHT: 161  Lab orders placed from triage: UA

## 2024-02-19 NOTE — Anesthesia Procedure Notes (Signed)
 Procedure Name: Intubation Date/Time: 02/19/2024 3:07 PM  Performed by: Virgil Ee, CRNAPre-anesthesia Checklist: Patient identified, Patient being monitored, Timeout performed, Emergency Drugs available and Suction available Patient Re-evaluated:Patient Re-evaluated prior to induction Oxygen Delivery Method: Circle system utilized Preoxygenation: Pre-oxygenation with 100% oxygen Induction Type: IV induction Ventilation: Mask ventilation without difficulty Laryngoscope Size: Mac and 3 Grade View: Grade I Tube type: Oral Tube size: 6.5 mm Number of attempts: 1 Airway Equipment and Method: Stylet Placement Confirmation: ETT inserted through vocal cords under direct vision, positive ETCO2 and breath sounds checked- equal and bilateral Secured at: 22 cm Tube secured with: Tape Dental Injury: Teeth and Oropharynx as per pre-operative assessment

## 2024-02-19 NOTE — Anesthesia Preprocedure Evaluation (Addendum)
 Anesthesia Evaluation  Patient identified by MRN, date of birth, ID band Patient awake    Reviewed: Allergy & Precautions, H&P , NPO status , Patient's Chart, lab work & pertinent test results  Airway Mallampati: II  TM Distance: >3 FB Neck ROM: Full    Dental  (+) Teeth Intact, Dental Advisory Given   Pulmonary Patient abstained from smoking., former smoker   Pulmonary exam normal breath sounds clear to auscultation       Cardiovascular negative cardio ROS Normal cardiovascular exam Rhythm:Regular Rate:Normal     Neuro/Psych  PSYCHIATRIC DISORDERS Anxiety Depression    negative neurological ROS     GI/Hepatic negative GI ROS, Neg liver ROS,,,  Endo/Other  negative endocrine ROS    Renal/GU negative Renal ROS  negative genitourinary   Musculoskeletal negative musculoskeletal ROS (+)    Abdominal   Peds negative pediatric ROS (+)  Hematology  (+) Blood dyscrasia, anemia Hb 9.4, plt 281   Anesthesia Other Findings   Reproductive/Obstetrics (+) Pregnancy                              Anesthesia Physical Anesthesia Plan  ASA: 2  Anesthesia Plan: General   Post-op Pain Management: Ofirmev  IV (intra-op)*   Induction: Intravenous  PONV Risk Score and Plan: 3 and Ondansetron , Dexamethasone , Treatment may vary due to age or medical condition and Scopolamine  patch - Pre-op  Airway Management Planned: Oral ETT  Additional Equipment: None  Intra-op Plan:   Post-operative Plan: Extubation in OR  Informed Consent: I have reviewed the patients History and Physical, chart, labs and discussed the procedure including the risks, benefits and alternatives for the proposed anesthesia with the patient or authorized representative who has indicated his/her understanding and acceptance.     Dental advisory given  Plan Discussed with: CRNA  Anesthesia Plan Comments: ([redacted] weeks pregnant: no  toradol , no sugammadex Complicated fetal history (22week demise), offered versed  if she would like it preop)         Anesthesia Quick Evaluation

## 2024-02-20 ENCOUNTER — Encounter (HOSPITAL_COMMUNITY): Payer: Self-pay | Admitting: General Surgery

## 2024-02-20 DIAGNOSIS — K567 Ileus, unspecified: Secondary | ICD-10-CM | POA: Diagnosis not present

## 2024-02-20 DIAGNOSIS — Z3A20 20 weeks gestation of pregnancy: Secondary | ICD-10-CM | POA: Diagnosis not present

## 2024-02-20 DIAGNOSIS — Z87891 Personal history of nicotine dependence: Secondary | ICD-10-CM | POA: Diagnosis not present

## 2024-02-20 DIAGNOSIS — Z8249 Family history of ischemic heart disease and other diseases of the circulatory system: Secondary | ICD-10-CM | POA: Diagnosis not present

## 2024-02-20 DIAGNOSIS — K35211 Acute appendicitis with generalized peritonitis, with perforation and abscess: Secondary | ICD-10-CM | POA: Diagnosis present

## 2024-02-20 DIAGNOSIS — K5900 Constipation, unspecified: Secondary | ICD-10-CM | POA: Diagnosis present

## 2024-02-20 DIAGNOSIS — E876 Hypokalemia: Secondary | ICD-10-CM | POA: Diagnosis not present

## 2024-02-20 DIAGNOSIS — Z79899 Other long term (current) drug therapy: Secondary | ICD-10-CM | POA: Diagnosis not present

## 2024-02-20 DIAGNOSIS — O99282 Endocrine, nutritional and metabolic diseases complicating pregnancy, second trimester: Secondary | ICD-10-CM | POA: Diagnosis not present

## 2024-02-20 DIAGNOSIS — Z9889 Other specified postprocedural states: Secondary | ICD-10-CM

## 2024-02-20 DIAGNOSIS — Z634 Disappearance and death of family member: Secondary | ICD-10-CM | POA: Diagnosis not present

## 2024-02-20 DIAGNOSIS — O99612 Diseases of the digestive system complicating pregnancy, second trimester: Secondary | ICD-10-CM | POA: Diagnosis present

## 2024-02-20 DIAGNOSIS — K9189 Other postprocedural complications and disorders of digestive system: Secondary | ICD-10-CM | POA: Diagnosis not present

## 2024-02-20 LAB — BASIC METABOLIC PANEL WITH GFR
Anion gap: 9 (ref 5–15)
BUN: <5 mg/dL — ABNORMAL LOW (ref 6–20)
CO2: 21 mmol/L — ABNORMAL LOW (ref 22–32)
Calcium: 8.4 mg/dL — ABNORMAL LOW (ref 8.9–10.3)
Chloride: 106 mmol/L (ref 98–111)
Creatinine, Ser: 0.6 mg/dL (ref 0.44–1.00)
GFR, Estimated: >60 mL/min (ref >60)
Glucose, Bld: 100 mg/dL — ABNORMAL HIGH (ref 70–99)
Potassium: 3.4 mmol/L — ABNORMAL LOW (ref 3.5–5.1)
Sodium: 136 mmol/L (ref 135–145)

## 2024-02-20 LAB — CBC
HCT: 27.8 % — ABNORMAL LOW (ref 36.0–46.0)
Hemoglobin: 9.2 g/dL — ABNORMAL LOW (ref 12.0–15.0)
MCH: 29.7 pg (ref 26.0–34.0)
MCHC: 33.1 g/dL (ref 30.0–36.0)
MCV: 89.7 fL (ref 80.0–100.0)
Platelets: 335 K/uL (ref 150–400)
RBC: 3.1 MIL/uL — ABNORMAL LOW (ref 3.87–5.11)
RDW: 12.7 % (ref 11.5–15.5)
WBC: 10.8 K/uL — ABNORMAL HIGH (ref 4.0–10.5)
nRBC: 0 % (ref 0.0–0.2)

## 2024-02-20 MED ORDER — TERBUTALINE SULFATE 1 MG/ML IJ SOLN
0.2500 mg | Freq: Once | INTRAMUSCULAR | Status: AC
  Administered 2024-02-20: 23:00:00 0.25 mg via SUBCUTANEOUS
  Filled 2024-02-20: qty 0.25
  Filled 2024-02-20: qty 1

## 2024-02-20 MED ORDER — POTASSIUM CHLORIDE 20 MEQ PO PACK: 40.0000 meq | PACK | Freq: Once | ORAL | Status: DC

## 2024-02-20 MED ORDER — POTASSIUM CHLORIDE 20 MEQ PO PACK
40.0000 meq | PACK | Freq: Two times a day (BID) | ORAL | Status: DC
  Filled 2024-02-20: qty 2

## 2024-02-20 MED ORDER — ENOXAPARIN SODIUM 40 MG/0.4ML IJ SOSY
40.0000 mg | PREFILLED_SYRINGE | INTRAMUSCULAR | Status: DC
  Administered 2024-02-20 – 2024-02-26 (×7): 40 mg via SUBCUTANEOUS
  Filled 2024-02-20 (×7): qty 0.4

## 2024-02-20 MED ORDER — CYCLOBENZAPRINE HCL 5 MG PO TABS
5.0000 mg | ORAL_TABLET | Freq: Three times a day (TID) | ORAL | Status: DC
  Administered 2024-02-20 – 2024-02-26 (×18): 5 mg via ORAL
  Filled 2024-02-20 (×19): qty 1

## 2024-02-20 MED ORDER — POTASSIUM CHLORIDE CRYS ER 20 MEQ PO TBCR
40.0000 meq | EXTENDED_RELEASE_TABLET | Freq: Two times a day (BID) | ORAL | Status: DC
  Administered 2024-02-20 – 2024-02-26 (×10): 40 meq via ORAL
  Filled 2024-02-20 (×14): qty 2

## 2024-02-20 MED ORDER — DOCUSATE SODIUM 100 MG PO CAPS
100.0000 mg | ORAL_CAPSULE | Freq: Two times a day (BID) | ORAL | Status: DC
  Administered 2024-02-20 – 2024-02-26 (×13): 100 mg via ORAL
  Filled 2024-02-20 (×13): qty 1

## 2024-02-20 MED ORDER — LACTATED RINGERS IV BOLUS: 500.0000 mL | Freq: Once | INTRAVENOUS | Status: DC

## 2024-02-20 MED ORDER — METRONIDAZOLE 500 MG/100ML IV SOLN
500.0000 mg | Freq: Two times a day (BID) | INTRAVENOUS | Status: DC
  Administered 2024-02-20 – 2024-02-25 (×10): 500 mg via INTRAVENOUS
  Filled 2024-02-20 (×10): qty 100

## 2024-02-20 MED ORDER — NIFEDIPINE 10 MG PO CAPS
10.0000 mg | ORAL_CAPSULE | ORAL | Status: DC | PRN
  Administered 2024-02-20: 02:00:00 10 mg via ORAL
  Filled 2024-02-20: qty 1

## 2024-02-20 MED ORDER — LACTATED RINGERS IV BOLUS
1000.0000 mL | Freq: Once | INTRAVENOUS | Status: AC
  Administered 2024-02-20: 02:00:00 1000 mL via INTRAVENOUS

## 2024-02-20 MED ORDER — LACTATED RINGERS IV SOLN: INTRAVENOUS | Status: DC

## 2024-02-20 MED ORDER — COMPLETENATE 29-1 MG PO CHEW
1.0000 | CHEWABLE_TABLET | Freq: Every day | ORAL | Status: DC
  Administered 2024-02-20 – 2024-02-25 (×6): 1 via ORAL
  Filled 2024-02-20 (×7): qty 1

## 2024-02-20 NOTE — Anesthesia Postprocedure Evaluation (Signed)
 Anesthesia Post Note  Patient: Christy Gonzalez  Procedure(s) Performed: LAPAROSCOPY, DIAGNOSTIC (Abdomen) COLECTOMY, RIGHT (Right: Abdomen)     Patient location during evaluation: PACU Anesthesia Type: General Level of consciousness: awake and alert Pain management: pain level controlled Vital Signs Assessment: post-procedure vital signs reviewed and stable Respiratory status: spontaneous breathing, nonlabored ventilation, respiratory function stable and patient connected to nasal cannula oxygen Cardiovascular status: blood pressure returned to baseline and stable Postop Assessment: no apparent nausea or vomiting Anesthetic complications: no   No notable events documented.  Last Vitals:  Vitals:   02/20/24 0300 02/20/24 0404  BP:  122/67  Pulse:  70  Resp:  16  Temp:  36.8 C  SpO2: 98% 100%    Last Pain:  Vitals:   02/20/24 0625  TempSrc:   PainSc: Asleep                 Annaliese Saez S

## 2024-02-20 NOTE — Progress Notes (Signed)
 2150: OBRRN at bedside for qshift doppler. Pt states she is having more consistent cramping and feelings that baby is bawling up Doppler 155-166bpm, abdomen does palpate for mild ctx when pt complains of this. Two while at bedside, about three min apart.   2155: Dr. Henry called and updated on pt having more consistent cramping and feelings that baby is bawling up Doppler 155-166bpm, abdomen does palpate for mild ctx when pt complains of this. Two while at bedside, about three min apart. Orders to do NST to see consistency of ctx, and a 500cc bolus.   2250: Dr. Henry called and notified of pt ctx q4-6 min rating 8/10. Orders to perform SVE and give dose of Terbutaline . Continue monitoring for post Terb.  2302: Orders to call Vivian Grice, CNM with follow up post Terbutaline .   2325: Terbutaline  0.25mg  given.   0004: Mercer Peal, CNM called and updated on pt post ex-lap for perforated appendix. Came to doppler and pt stated she is having more consistent cramping and feelings that baby is bawling up Doppler 155-166bpm. Dr. Henry called and updated on pt having more consistent cramping and feelings that baby is bawling up Doppler 155-166bpm, abdomen does palpate for mild ctx when pt complains of this. Two while at bedside, about three min apart. Orders to do NST to see consistency of ctx, and a 500cc bolus. Dr. Henry then called and notified of pt ctx q4-6 min rating 8/10. Orders to perform SVE and give dose of Terbutaline . Continue monitoring for post Terb and to follow up with Vivian, CNM. Pt contracting irregularly now and not feeling them. No further orders at this time, will continue qshift monitoring as ordered. Pt informed to tell RN if this happens again and she will let OBRRN know.

## 2024-02-20 NOTE — Progress Notes (Addendum)
 [facility] Progress Note  1 Day Post-Op  Subjective: CC:  Reports some abdominal soreness, worse on the right side, worse with moving, coughing, deep breathing. Pain is temporarily improved with pain meds. Reports some nausea this morning but no vomiting, this improved with compazine . No flatus or BM yet. Patient wants to get OOB.   Objective: Vital signs in last 24 hours: Temp:  [97.8 F (36.6 C)-99.1 F (37.3 C)] 98.2 F (36.8 C) (12/16 0404) Pulse Rate:  [63-93] 70 (12/16 0404) Resp:  [14-24] 16 (12/16 0404) BP: (101-130)/(60-88) 122/67 (12/16 0404) SpO2:  [98 %-100 %] 100 % (12/16 0404)    Intake/Output from previous day: 12/15 0701 - 12/16 0700 In: 3072.3 [P.O.:180; I.V.:1595; IV Piggyback:1297.3] Out: 1395 [Urine:1175; Drains:170; Blood:50] Intake/Output this shift: No intake/output data recorded.  PE: Gen:  somnolent but arouses appropriately to voice Card:  Regular rate and rhythm, pedal pulses 2+ BL Pulm:  Normal effort ORA Abd: Soft, appropriately tender, midline incision c/d/I, blake drain cloudy SS (170 mL) GU: foley in place, 1200 mL urine Skin: warm and dry, no rashes  Psych: A&Ox3   Lab Results:  Recent Labs    02/18/24 0822 02/20/24 0536  WBC 7.5 10.8*  HGB 9.4* 9.2*  HCT 27.2* 27.8*  PLT 281 335   BMET Recent Labs    02/18/24 0822 02/20/24 0536  NA 136 136  K 3.4* 3.4*  CL 101 106  CO2 22 21*  GLUCOSE 100* 100*  BUN <5* <5*  CREATININE 0.49 0.60  CALCIUM  8.8* 8.4*   PT/INR No results for input(s): LABPROT, INR in the last 72 hours. CMP     Component Value Date/Time   NA 136 02/20/2024 0536   K 3.4 (L) 02/20/2024 0536   CL 106 02/20/2024 0536   CO2 21 (L) 02/20/2024 0536   GLUCOSE 100 (H) 02/20/2024 0536   BUN <5 (L) 02/20/2024 0536   CREATININE 0.60 02/20/2024 0536   CALCIUM  8.4 (L) 02/20/2024 0536   PROT 6.5 02/18/2024 0822   ALBUMIN 2.6 (L) 02/18/2024 0822   AST 10 (L) 02/18/2024 0822   ALT 8 02/18/2024  0822   ALKPHOS 61 02/18/2024 0822   BILITOT 0.5 02/18/2024 0822   GFRNONAA >60 02/20/2024 0536   GFRAA 81 (L) 02/11/2014 2225   Lipase     Component Value Date/Time   LIPASE 24 11/05/2022 0356       Studies/Results: CT ABDOMEN PELVIS W CONTRAST Result Date: 02/18/2024 EXAM: CT ABDOMEN AND PELVIS WITH CONTRAST 02/18/2024 10:52:46 AM TECHNIQUE: CT of the abdomen and pelvis was performed with the administration of intravenous contrast. Multiplanar reformatted images are provided for review. Automated exposure control, iterative reconstruction, and/or weight-based adjustment of the mA/kV was utilized to reduce the radiation dose to as low as reasonably achievable. 75 mL iohexol  (OMNIPAQUE ) 350 MG/ML injection was administered. COMPARISON: Abdomen ultrasound 02/14/2024. MRI abdomen and pelvis 02/27/2023. CLINICAL HISTORY: 28 year old female pregnant in the 2nd trimester with right lower quadrant and back pain. FINDINGS: LOWER CHEST: Visible lower chest is within normal limits for the pregnancy state. LIVER: Trace free fluid is also possible just inferior to the right liver tip such as on coronal image 51. GALLBLADDER AND BILE DUCTS: Gallbladder is unremarkable. No biliary ductal dilatation. SPLEEN: No acute abnormality. PANCREAS: No acute abnormality. ADRENAL GLANDS: No acute abnormality. KIDNEYS, URETERS AND BLADDER: No stones in the kidneys or ureters. No hydronephrosis. No perinephric or periureteral stranding. Urinary bladder is unremarkable. GI AND BOWEL: Stomach demonstrates no  acute abnormality. Small volume retained fluid in the stomach which appears negative. Decompressed and negative duodenum. Nondilated small bowel throughout the abdomen. Compressed rectosigmoid colon. Redundant junction of the descending and sigmoid in the left lower quadrant containing gas, otherwise decompressed descending colon. Gas filled but nondilated transverse colon. In the right abdomen there is confluent  inflammation surrounding the cecum (coronal image 47). No normal appendix is identified. On series 3 image 40 and coronal image 54, there is a thickened and hyperenhancing tubular structure suspicious for an inflamed abnormal appendix, up to 12 mm diameter. It appears to be looping 180 degrees, but the appendix architecture is difficult to delineate. These changes are inseparable from the right ovary, with enlarged and enhancing gonadal vessels visible on those images, and indeterminate multilocular appearing rim enhancing fluid in the region such as on series 3 image 44 (largest such focus is 2 cm diameter). The terminal ileum emerging medially on coronal image 47 is partially thickened and inflamed, but does not seem to be the epicenter. No bowel obstruction. PERITONEUM AND RETROPERITONEUM: No pneumoperitoneum identified. No convincing free fluid. Trace free fluid is also possible just inferior to the right liver tip such as on coronal image 51. VASCULATURE: Aorta is normal in caliber. Major arterial structures and the portal venous system in the abdomen and pelvis are patent. LYMPH NODES: No lymphadenopathy. REPRODUCTIVE ORGANS: Gravid uterus. The right ovary is involved in the inflammatory process in the right lower quadrant, with enlarged and enhancing gonadal vessels visible on series 3 image 40 and coronal image 54. Indeterminate multilocular appearing rim enhancing fluid in the region, such as on series 3 image 44 (largest such focus is 2 cm diameter), is inseparable from the right ovary. BONES AND SOFT TISSUES: No acute osseous abnormality. No focal soft tissue abnormality. IMPRESSION: 1. Confluent inflammation at the cecum, with appearance highly suspicious for dilated and inflamed gas-less appendix - but also inseparable from the right ovary. Multilocular small rim enhancing fluid collections there (up to 2 cm) indeterminate for perforated appendicitis with abscess versus ovarian cysts. No  pneumoperitoneum identified. And the terminal ileum is also partially inflamed. 2. No bowel obstruction. 3. Gravid uterus. Electronically signed by: Helayne Hurst MD 02/18/2024 11:44 AM EST RP Workstation: [id number]    Anti-infectives: Anti-infectives (From admission, onward)    Start     Dose/Rate Route Frequency Ordered Stop   02/19/24 1300  metroNIDAZOLE  (FLAGYL ) IVPB 500 mg        500 mg 100 mL/hr over 60 Minutes Intravenous Every 12 hours 02/19/24 1156     02/19/24 1230  cefTRIAXone  (ROCEPHIN ) 2 g in sodium chloride  0.9 % 100 mL IVPB       Note to Pharmacy: Pharmacy may adjust dosing strength / duration / interval for maximal efficacy   2 g 200 mL/hr over 30 Minutes Intravenous Every 24 hours 02/19/24 1156          Assessment/Plan  Perforated appendicitis  POD#1 s/p diagnostic laparoscopy, exploratory laparotomy, right colectomy 12/15 Dr. Ebbie -afebrile, vitals WNL, WBC 10  - hgb  stable 9.2 from 9.4  - continue clears and await bowel function - continue blake drain, plan to remove prior to D/C - IS/mobilize   Pain: 1,000 mg tylenol  q 6h, flexeril  5 mg TID, oxycodone  5-10 mg q 4h PRN, 0.5 mg dilaudid  q 2h PRN breakthrough FEN: CLD, IVF @ 75 mL/hr, hypokalemia 3.4 (give 40 mEq KCL BID, check Mg in AM)  ID: Rocephin /Flagyl  12/15 >> plan for total  of 5 days  VTE: SCD's, start daily lovenox  (Reviewed with pharmacy, preferred anticoagulant in pregnancy) Foley: D/C today POD#1  Dispo: transfer to med-surg unit   [redacted]w[redacted]d pregnancy - fetal heart tones q shift    LOS: 0 days   I reviewed nursing notes, Consultant OBGYN notes, last 24 h vitals and pain scores, last 48 h intake and output, last 24 h labs and trends, and last 24 h imaging results.  This care required moderate level of medical decision making.   Almarie Pringle, PA-C [facility] Please see Amion for pager number during day hours 7:00am-4:30pm

## 2024-02-20 NOTE — Plan of Care (Signed)
 Patient came from other unit. Needs attended. Pain is 8/10, pain meds given. Kept safe. Problem: Education: Goal: Knowledge of General Education information will improve Description: Including pain rating scale, medication(s)/side effects and non-pharmacologic comfort measures Outcome: Progressing   Problem: Pain Managment: Goal: General experience of comfort will improve and/or be controlled Outcome: Progressing   Problem: Safety: Goal: Ability to remain free from injury will improve Outcome: Progressing

## 2024-02-20 NOTE — Plan of Care (Signed)

## 2024-02-21 LAB — CBC
HCT: 22.3 % — ABNORMAL LOW (ref 36.0–46.0)
HCT: 23.8 % — ABNORMAL LOW (ref 36.0–46.0)
Hemoglobin: 7.6 g/dL — ABNORMAL LOW (ref 12.0–15.0)
Hemoglobin: 8 g/dL — ABNORMAL LOW (ref 12.0–15.0)
MCH: 29.8 pg (ref 26.0–34.0)
MCH: 29.2 pg (ref 26.0–34.0)
MCHC: 34.1 g/dL (ref 30.0–36.0)
MCHC: 33.6 g/dL (ref 30.0–36.0)
MCV: 87.5 fL (ref 80.0–100.0)
MCV: 86.9 fL (ref 80.0–100.0)
Platelets: 313 K/uL (ref 150–400)
Platelets: 353 K/uL (ref 150–400)
RBC: 2.55 MIL/uL — ABNORMAL LOW (ref 3.87–5.11)
RBC: 2.74 MIL/uL — ABNORMAL LOW (ref 3.87–5.11)
RDW: 12.8 % (ref 11.5–15.5)
RDW: 13 % (ref 11.5–15.5)
WBC: 8.2 K/uL (ref 4.0–10.5)
WBC: 9.4 K/uL (ref 4.0–10.5)
nRBC: 0 % (ref 0.0–0.2)
nRBC: 0 % (ref 0.0–0.2)

## 2024-02-21 LAB — BASIC METABOLIC PANEL WITH GFR
Anion gap: 8 (ref 5–15)
BUN: <5 mg/dL — ABNORMAL LOW (ref 6–20)
CO2: 24 mmol/L (ref 22–32)
Calcium: 8.9 mg/dL (ref 8.9–10.3)
Chloride: 106 mmol/L (ref 98–111)
Creatinine, Ser: 0.53 mg/dL (ref 0.44–1.00)
GFR, Estimated: >60 mL/min (ref >60)
Glucose, Bld: 97 mg/dL (ref 70–99)
Potassium: 3.3 mmol/L — ABNORMAL LOW (ref 3.5–5.1)
Sodium: 138 mmol/L (ref 135–145)

## 2024-02-21 LAB — MAGNESIUM: Magnesium: 1.4 mg/dL — ABNORMAL LOW (ref 1.7–2.4)

## 2024-02-21 LAB — SURGICAL PATHOLOGY

## 2024-02-21 MED ORDER — TERBUTALINE SULFATE 1 MG/ML IJ SOLN: 0.2500 mg | Freq: Once | INTRAMUSCULAR | Status: DC | PRN

## 2024-02-21 MED ORDER — DEXTROSE 5 % IV SOLN: INTRAVENOUS | Status: DC

## 2024-02-21 MED ORDER — KCL IN DEXTROSE-NACL 20-5-0.45 MEQ/L-%-% IV SOLN
INTRAVENOUS | Status: AC
  Filled 2024-02-21: qty 1000

## 2024-02-21 NOTE — Progress Notes (Signed)
 Pt is G5P3 at 21 weeks who is currently inpatient on 6N following diagnostic laparoscopy and exploratory laparotomy with right colectomy.  She was treated overnight for UCs.  This morning pt reports feeling much better with no OB complaints.  FHR dopplered at 154bpm.  Pt expresses concern about UCs returning at home when she is discharged from hospital.  RROB explains that if this happens she should immediately reach out to her OB.  She is also informed to come to MAU if she needs to be seen urgently.  Pt expresses understanding and seems reassured by this information.  Dr Armond made aware.

## 2024-02-21 NOTE — TOC Initial Note (Signed)
 Transition of Care (TOC) - Initial/Assessment Note   Spoke to patient at bedside. Patient from home with minor children.  Prior to admission was independent    Patient has a sister close by , but not sure how much sister can assist her after discharge if needed. Patient speak with friends/family if assist needed.   Per chart plan to remove drain prior to discharge .   Bedside nurse will provide any wound education and drain education ( if needed) prior to discharge , and supply some supplies at discharge.    Patient does not have a PCP . Patient consented to Inpatient Care Management Team to make an appointment . NCm sent message to CMA. Appointment information will be placed on patient's discharge instruction paperwork.   Patient voiced understanding  Patient Details  Name: Christy Gonzalez MRN: [id number] Date of Birth: Sep 08, 1995  Transition of Care [facility]) CM/SW Contact:    Christy Powell Jansky, RN Phone Number: 02/21/2024, 10:24 AM  Clinical Narrative:                   Expected Discharge Plan: Home/Self Care Barriers to Discharge: Continued Medical Work up   Patient Goals and CMS Choice Patient states their goals for this hospitalization and ongoing recovery are:: to return to home   Choice offered to / list presented to : NA      Expected Discharge Plan and Services In-house Referral: NA Discharge Planning Services: CM Consult Post Acute Care Choice: NA Living arrangements for the past 2 months: Apartment                 DME Arranged: N/A DME Agency: NA       HH Arranged: NA HH Agency: NA        Prior Living Arrangements/Services Living arrangements for the past 2 months: Apartment Lives with:: Self (minor children) Patient language and need for interpreter reviewed:: Yes Do you feel safe going back to the place where you live?: Yes      Need for Family Participation in Patient Care: No (Comment)     Criminal Activity/Legal Involvement Pertinent to  Current Situation/Hospitalization: No - Comment as needed  Activities of Daily Living   ADL Screening (condition at time of admission) Independently performs ADLs?: Yes (appropriate for developmental age) Is the patient deaf or have difficulty hearing?: No Does the patient have difficulty seeing, even when wearing glasses/contacts?: No Does the patient have difficulty concentrating, remembering, or making decisions?: No  Permission Sought/Granted   Permission granted to share information with : No              Emotional Assessment Appearance:: Appears stated age Attitude/Demeanor/Rapport: Engaged Affect (typically observed): Appropriate Orientation: : Oriented to Self, Oriented to Place, Oriented to  Time, Oriented to Situation Alcohol / Substance Use: Not Applicable Psych Involvement: No (comment)  Admission diagnosis:  Appendicitis with perforation [K35.32] S/P exploratory laparotomy [Z98.890] Post-operative state [Z98.890] Patient Active Problem List   Diagnosis Date Noted   Post-operative state 02/20/2024   Appendicitis with perforation 02/19/2024   S/P exploratory laparotomy 02/19/2024   Current pregnancy in first trimester with history of neonatal death during prior pregnancy February 01, 2024   High risk pregnancy due to history of preterm labor 12/28/2023   Delivery by classical cesarean section 11/27/2023   History of preterm premature rupture of membranes (PPROM) 11/16/2023   Mixed anxiety and depressive disorder 09/04/2023   History of preterm delivery 04/03/2023   Vitamin D deficiency 11/28/2022  Carrier of group B Streptococcus 02/24/2018   PCP:  Patient, No Pcp Per Pharmacy:   [id number] - [place], [place] - [address] AT [address] [place] [address] Phone: [phone] Fax: [phone]  [id number] - [place], [place] - [address] AT [facility] [address] [place]  [place] [address] Phone: [phone] Fax: [phone]  [id number] - [place], [place] - [address] AT [facility] OF [address] [place] [address] Phone: [phone] Fax: [phone]  [id number] - [place], [place] - [address] AT [facility] OF [address] [place] [facility] [address] Phone: [phone] Fax: [phone]     Social Drivers of Health (SDOH) Social History: SDOH Screenings   Food Insecurity: No Food Insecurity (02/19/2024)  Housing: Low Risk (02/19/2024)  Transportation Needs: No Transportation Needs (02/19/2024)  Utilities: Not At Risk (02/19/2024)  Tobacco Use: Medium Risk (02/19/2024)   SDOH Interventions:     Readmission Risk Interventions     No data to display

## 2024-02-21 NOTE — Progress Notes (Signed)
 OBRRN at bedside for qshift dopplers. Pt states that she is feeling better, does feel the tightening or bawling up of baby at times but nothing consistent or painful. No OB complaints. Instructed pt to let RN know if that changes and they will contact OBRRN to assess, pt verbalizes understanding. Doppler 155-166bpm, fetal movements audible and felt by pt regularly. No bleeding, LOF, HA, visual disturbances, or cramping.

## 2024-02-21 NOTE — Plan of Care (Signed)

## 2024-02-21 NOTE — Progress Notes (Signed)
 Pt without complaint BP 112/72 (BP Location: Left Arm)   Pulse 83   Temp 98.7 F (37.1 C) (Oral)   Resp 16   LMP 09/27/2023   SpO2 100%   Abd gravid, soft NT.  Bandage CDI TERB and procardia  for contractions

## 2024-02-21 NOTE — Progress Notes (Signed)
 2 Days Post-Op   Subjective/Chief Complaint: Events noted overnight with ob, no flatus yet, doing ok this am   Objective: Vital signs in last 24 hours: Temp:  [97.7 F (36.5 C)-98.2 F (36.8 C)] 98.2 F (36.8 C) (12/17 0509) Pulse Rate:  [70-80] 77 (12/17 0509) Resp:  [16-18] 16 (12/17 0509) BP: (107-121)/(65-78) 107/78 (12/17 0509) SpO2:  [99 %-100 %] 100 % (12/17 0509)    Intake/Output from previous day: 12/16 0701 - 12/17 0700 In: 1936.4 [P.O.:480; I.V.:1156.4; IV Piggyback:300] Out: 1295 [Urine:1225; Drains:70] Intake/Output this shift: No intake/output data recorded.  General nad Cv regular Pulm effort normal Ab approp tender, drain serosang, dressing dry  Lab Results:  Recent Labs    02/20/24 0536 02/21/24 0334  WBC 10.8* 8.2  HGB 9.2* 7.6*  HCT 27.8* 22.3*  PLT 335 313   BMET Recent Labs    02/20/24 0536 02/21/24 0334  NA 136 138  K 3.4* 3.3*  CL 106 106  CO2 21* 24  GLUCOSE 100* 97  BUN <5* <5*  CREATININE 0.60 0.53  CALCIUM  8.4* 8.9   PT/INR No results for input(s): LABPROT, INR in the last 72 hours. ABG No results for input(s): PHART, HCO3 in the last 72 hours.  Invalid input(s): PCO2, PO2  Studies/Results: No results found.  Anti-infectives: Anti-infectives (From admission, onward)    Start     Dose/Rate Route Frequency Ordered Stop   02/20/24 2359  metroNIDAZOLE  (FLAGYL ) IVPB 500 mg        500 mg 100 mL/hr over 60 Minutes Intravenous 2 times daily 02/20/24 2259     02/19/24 1300  metroNIDAZOLE  (FLAGYL ) IVPB 500 mg  Status:  Discontinued        500 mg 100 mL/hr over 60 Minutes Intravenous Every 12 hours 02/19/24 1156 02/20/24 2259   02/19/24 1230  cefTRIAXone  (ROCEPHIN ) 2 g in sodium chloride  0.9 % 100 mL IVPB       Note to Pharmacy: Pharmacy may adjust dosing strength / duration / interval for maximal efficacy   2 g 200 mL/hr over 30 Minutes Intravenous Every 24 hours 02/19/24 1156         Assessment/Plan: POD  2 s/p diagnostic laparoscopy, exploratory laparotomy, right colectomy 12/15 Dr. Ebbie for perf appendicitis -afebrile, vitals WNL, WBC normal - hgb down- will recheck later today, not unexpected, drain not bloody - continue clears and await bowel function - continue blake drain, plan to remove prior to D/C - IS/mobilize    Pain: 1,000 mg tylenol  q 6h, flexeril  5 mg TID, oxycodone  5-10 mg q 4h PRN, 0.5 mg dilaudid  q 2h PRN breakthrough FEN: CLD, IVF @ 75 mL/hr, hypokalemia 3.4 will replace with fluids ID: Rocephin /Flagyl  12/15 >> plan for total of 5 days  VTE: SCD's, start daily lovenox  (Reviewed with pharmacy, preferred anticoagulant in pregnancy) Dispo: transfer to med-surg unit    [redacted]w[redacted]d pregnancy - fetal heart tones q shift, ob following   Donnice Ebbie 02/21/2024

## 2024-02-22 LAB — BASIC METABOLIC PANEL WITH GFR
Anion gap: 9 (ref 5–15)
BUN: 6 mg/dL (ref 6–20)
CO2: 23 mmol/L (ref 22–32)
Calcium: 8.9 mg/dL (ref 8.9–10.3)
Chloride: 104 mmol/L (ref 98–111)
Creatinine, Ser: 0.46 mg/dL (ref 0.44–1.00)
GFR, Estimated: >60 mL/min (ref >60)
Glucose, Bld: 113 mg/dL — ABNORMAL HIGH (ref 70–99)
Potassium: 3.4 mmol/L — ABNORMAL LOW (ref 3.5–5.1)
Sodium: 137 mmol/L (ref 135–145)

## 2024-02-22 LAB — CBC
HCT: 23.6 % — ABNORMAL LOW (ref 36.0–46.0)
Hemoglobin: 7.8 g/dL — ABNORMAL LOW (ref 12.0–15.0)
MCH: 28.9 pg (ref 26.0–34.0)
MCHC: 33.1 g/dL (ref 30.0–36.0)
MCV: 87.4 fL (ref 80.0–100.0)
Platelets: 328 K/uL (ref 150–400)
RBC: 2.7 MIL/uL — ABNORMAL LOW (ref 3.87–5.11)
RDW: 13.1 % (ref 11.5–15.5)
WBC: 5.9 K/uL (ref 4.0–10.5)
nRBC: 0 % (ref 0.0–0.2)

## 2024-02-22 NOTE — Progress Notes (Signed)
 OBRRN at bedside doing fetal heart tones, Pt was experiencing contractions along with pain. Provider made aware, MORT will monitor and treat accordingly. See [facility]

## 2024-02-22 NOTE — Progress Notes (Addendum)
 No complaints of cramping, contractions, leaking or bleeding at this time.  Patient in bed.  Doppler FHT's 155.  Good fetal movement audible and felt by patient.  Patient started  throwing up green bile colored fluid.  Changed linens and gown.  Does feel better after episode.  Does not desire meds at this time.   Clotilda Beath RNC-OB RROB [phone]

## 2024-02-22 NOTE — Progress Notes (Signed)
 EFM removed, no deviations in toco noted, unable to palpate any contractions when pt would report the lower abd pressure. Reassured pt regarding no change in SVE. Discussed pain management with ordered meds, use of kpad or warm packs to ease discomfort, movement & keeping bladder empty. Pt to let her primary nurse know if 'pain'/pressure worsens/becomes more frequent.

## 2024-02-22 NOTE — Plan of Care (Signed)

## 2024-02-22 NOTE — Progress Notes (Signed)
 Patient is up and walking on unit s/p surgery. C/O pain.  Abdomen palpates a little distended.  No cxns palpated.  No bleeding or leaking.  No nausea/vomiting.  Doppler FHT's 155.  Patient states feels better after hearing baby.   Will continue to doppler FHT's q shift per order until discharge.  Clotilda Beath RNC-OB RROB [phone]

## 2024-02-22 NOTE — Progress Notes (Signed)
 Spoke with Dr JAYSON Furry regarding pt's complaints this evening. MD would like the of EFM completed, then perform SVE to note if there's any change. Pt & pt's nurse updated & agreeable with POC.

## 2024-02-22 NOTE — Progress Notes (Signed)
 Progress Note  3 Days Post-Op  Subjective: Patient reports some nausea. Denies vomiting. Tolerating CLD. Abdominal pain is better. Denies flatulence or BM since surgery.  ROS  All negative with the exception of above.  Objective: Vital signs in last 24 hours: Temp:  [97.8 F (36.6 C)-98.9 F (37.2 C)] 97.8 F (36.6 C) (12/18 0536) Pulse Rate:  [77-95] 77 (12/18 0536) Resp:  [14-17] 15 (12/18 0536) BP: (98-112)/(69-76) 98/73 (12/18 0536) SpO2:  [99 %-100 %] 100 % (12/18 0536)    Intake/Output from previous day: 12/17 0701 - 12/18 0700 In: 802.8 [P.O.:120; I.V.:382.7; IV Piggyback:300.2] Out: 50 [Drains:50] Intake/Output this shift: No intake/output data recorded.  PE: General: Pleasant female who is laying in bed in NAD. HEENT: Head is normocephalic, atraumatic.  Heart: HR normal during encounter. Lungs: Respiratory effort nonlabored on room air. Abd: Soft. Generalized tenderness to palpation. JP drain SS. Midline incision with staples covered with honeycomb dressing. No rebound tenderness or guarding.  MS: Able to move extremities.  Skin: Warm and dry.  Psych: A&Ox3 with an appropriate affect.    Lab Results:  Recent Labs    02/21/24 0334 02/21/24 0835  WBC 8.2 9.4  HGB 7.6* 8.0*  HCT 22.3* 23.8*  PLT 313 353   BMET Recent Labs    02/20/24 0536 02/21/24 0334  NA 136 138  K 3.4* 3.3*  CL 106 106  CO2 21* 24  GLUCOSE 100* 97  BUN <5* <5*  CREATININE 0.60 0.53  CALCIUM  8.4* 8.9   PT/INR No results for input(s): LABPROT, INR in the last 72 hours. CMP     Component Value Date/Time   NA 138 02/21/2024 0334   K 3.3 (L) 02/21/2024 0334   CL 106 02/21/2024 0334   CO2 24 02/21/2024 0334   GLUCOSE 97 02/21/2024 0334   BUN <5 (L) 02/21/2024 0334   CREATININE 0.53 02/21/2024 0334   CALCIUM  8.9 02/21/2024 0334   PROT 6.5 02/18/2024 0822   ALBUMIN 2.6 (L) 02/18/2024 0822   AST 10 (L) 02/18/2024 0822   ALT 8 02/18/2024 0822   ALKPHOS 61  02/18/2024 0822   BILITOT 0.5 02/18/2024 0822   GFRNONAA >60 02/21/2024 0334   GFRAA 81 (L) 02/11/2014 2225   Lipase     Component Value Date/Time   LIPASE 24 11/05/2022 0356       Studies/Results: No results found.  Anti-infectives: Anti-infectives (From admission, onward)    Start     Dose/Rate Route Frequency Ordered Stop   02/20/24 2359  metroNIDAZOLE  (FLAGYL ) IVPB 500 mg        500 mg 100 mL/hr over 60 Minutes Intravenous 2 times daily 02/20/24 2259     02/19/24 1300  metroNIDAZOLE  (FLAGYL ) IVPB 500 mg  Status:  Discontinued        500 mg 100 mL/hr over 60 Minutes Intravenous Every 12 hours 02/19/24 1156 02/20/24 2259   02/19/24 1230  cefTRIAXone  (ROCEPHIN ) 2 g in sodium chloride  0.9 % 100 mL IVPB       Note to Pharmacy: Pharmacy may adjust dosing strength / duration / interval for maximal efficacy   2 g 200 mL/hr over 30 Minutes Intravenous Every 24 hours 02/19/24 1156          Assessment/Plan POD 3 s/p diagnostic laparoscopy, exploratory laparotomy, right colectomy 12/15 Dr. Ebbie for perf appendicitis - Afebrile, vitals WNL. - Labs pending this AM. - No BM/flatulence since surgery. Some nausea but no vomiting. Tolerating CLD. - Continue CLD and  await return of bowel function. - Drain output 50; SS; Continue blake drain, plan to remove prior to D/C - IS/mobilize    Pain: 1,000 mg tylenol  q 6h, flexeril  5 mg TID, oxycodone  5-10 mg q 4h PRN, 0.5 mg dilaudid  q 2h PRN breakthrough FEN: CLD, Dextrose  and NaCl with Kcl ID: Rocephin /Flagyl  12/15 >> plan for total of 5 days  VTE: SCD's, Lovenox  Dispo: transfer to med-surg unit     LOS: 2 days   I reviewed consulting provider notes, specialist notes, nursing notes, last 24 h vitals and pain scores, last 48 h intake and output, last 24 h labs and trends, and last 24 h imaging results.   Marjorie Carlyon Favre, [facility] 02/22/2024, 8:24 AM Please see Amion for pager number during day hours  7:00am-4:30pm

## 2024-02-22 NOTE — Progress Notes (Signed)
 Received call from pts nurse; pt c/o cramping pain around 2225.  At bedside, pt c/o lower abd pressure pain, that it feels like cramping. Abd was palpated, was somewhat distended, however no contractions were palpated. No bleeding, no LOF. Reports positive fetal movement.   EFM at bedside, will monitor & update on-call provider.

## 2024-02-22 NOTE — Plan of Care (Signed)
°  Problem: Education: Goal: Knowledge of General Education information will improve Description: Including pain rating scale, medication(s)/side effects and non-pharmacologic comfort measures 02/22/2024 2029 by Aasir Daigler K, RN Outcome: Progressing 02/22/2024 2029 by Florian Dellis POUR, RN Outcome: Progressing   Problem: Health Behavior/Discharge Planning: Goal: Ability to manage health-related needs will improve 02/22/2024 2029 by Tilly Pernice K, RN Outcome: Progressing 02/22/2024 2029 by Pedro Whiters K, RN Outcome: Progressing   Problem: Clinical Measurements: Goal: Ability to maintain clinical measurements within normal limits will improve 02/22/2024 2029 by Navi Ewton K, RN Outcome: Progressing 02/22/2024 2029 by Aianna Fahs K, RN Outcome: Progressing Goal: Will remain free from infection 02/22/2024 2029 by Florian Dellis POUR, RN Outcome: Progressing 02/22/2024 2029 by Kaislyn Gulas K, RN Outcome: Progressing Goal: Diagnostic test results will improve 02/22/2024 2029 by Florian Dellis POUR, RN Outcome: Progressing 02/22/2024 2029 by Florian Dellis POUR, RN Outcome: Progressing Goal: Respiratory complications will improve 02/22/2024 2029 by Sadiq Mccauley K, RN Outcome: Progressing 02/22/2024 2029 by Glennon Kopko K, RN Outcome: Progressing Goal: Cardiovascular complication will be avoided 02/22/2024 2029 by Florian Dellis POUR, RN Outcome: Progressing 02/22/2024 2029 by Aniyla Harling K, RN Outcome: Progressing   Problem: Activity: Goal: Risk for activity intolerance will decrease 02/22/2024 2029 by Mayme Profeta K, RN Outcome: Progressing 02/22/2024 2029 by Florian Dellis POUR, RN Outcome: Progressing   Problem: Nutrition: Goal: Adequate nutrition will be maintained 02/22/2024 2029 by Florian Dellis POUR, RN Outcome: Progressing 02/22/2024 2029 by Florian Dellis POUR, RN Outcome: Progressing   Problem: Coping: Goal: Level of anxiety will decrease 02/22/2024  2029 by Florian Dellis POUR, RN Outcome: Progressing 02/22/2024 2029 by Florian Dellis POUR, RN Outcome: Progressing   Problem: Elimination: Goal: Will not experience complications related to bowel motility 02/22/2024 2029 by Florian Dellis POUR, RN Outcome: Progressing 02/22/2024 2029 by Florian Dellis POUR, RN Outcome: Progressing Goal: Will not experience complications related to urinary retention 02/22/2024 2029 by Florian Dellis POUR, RN Outcome: Progressing 02/22/2024 2029 by Florian Dellis POUR, RN Outcome: Progressing   Problem: Pain Managment: Goal: General experience of comfort will improve and/or be controlled 02/22/2024 2029 by Florian Dellis POUR, RN Outcome: Progressing 02/22/2024 2029 by Florian Dellis POUR, RN Outcome: Progressing   Problem: Safety: Goal: Ability to remain free from injury will improve 02/22/2024 2029 by Florian Dellis POUR, RN Outcome: Progressing 02/22/2024 2029 by Florian Dellis POUR, RN Outcome: Progressing   Problem: Skin Integrity: Goal: Risk for impaired skin integrity will decrease 02/22/2024 2029 by Evan Osburn K, RN Outcome: Progressing 02/22/2024 2029 by Ciarra Braddy K, RN Outcome: Progressing

## 2024-02-22 NOTE — Plan of Care (Signed)

## 2024-02-23 MED ORDER — POLYETHYLENE GLYCOL 3350 17 G PO PACK
17.0000 g | PACK | Freq: Every day | ORAL | Status: DC
  Administered 2024-02-23 – 2024-02-26 (×4): 17 g via ORAL
  Filled 2024-02-23 (×4): qty 1

## 2024-02-23 MED ORDER — CALCIUM CARBONATE ANTACID 500 MG PO CHEW
1.0000 | CHEWABLE_TABLET | Freq: Every day | ORAL | Status: DC
  Administered 2024-02-24 – 2024-02-26 (×3): 200 mg via ORAL
  Filled 2024-02-23 (×3): qty 1

## 2024-02-23 MED ORDER — BISACODYL 10 MG RE SUPP
10.0000 mg | Freq: Every day | RECTAL | Status: DC | PRN
  Filled 2024-02-23: qty 1

## 2024-02-23 MED ORDER — FAMOTIDINE 20 MG PO TABS
20.0000 mg | ORAL_TABLET | Freq: Two times a day (BID) | ORAL | Status: DC
  Administered 2024-02-23 – 2024-02-26 (×7): 20 mg via ORAL
  Filled 2024-02-23 (×7): qty 1

## 2024-02-23 NOTE — Progress Notes (Signed)
 Pt complained of tightness in lower ABD and pain. She said she felt as if it was contractions, I called OBRRN to evaluate at bedside and do fetal heart tons. Pt rated pain 7/10 on pain scale. OBRRN said that NST didn't show any contractions on the monitor but if the pt continued to have issues to call back.

## 2024-02-23 NOTE — Plan of Care (Signed)

## 2024-02-23 NOTE — Progress Notes (Signed)
 "  Progress Note  4 Days Post-Op  Subjective: Overall feels about the same, reports some soreness with cramping exacerbations. Denies flatus or BM. One episode of emesis yesterday but has been tolerating CLD since then.   ROS  All negative with the exception of above.  Objective: Vital signs in last 24 hours: Temp:  [97.8 F (36.6 C)-98.7 F (37.1 C)] 97.8 F (36.6 C) (12/19 1021) Pulse Rate:  [79-96] 96 (12/19 1021) Resp:  [17] 17 (12/19 1021) BP: (102-114)/(62-82) 114/82 (12/19 1021) SpO2:  [100 %] 100 % (12/19 1021) Last BM Date : 02/17/24  Intake/Output from previous day: 12/18 0701 - 12/19 0700 In: 1860 [P.O.:1860] Out: -  Intake/Output this shift: Total I/O In: 120 [P.O.:120] Out: -   PE: General: Pleasant female who is laying in bed in NAD. HEENT: Head is normocephalic, atraumatic.  Heart: HR normal during encounter. Lungs: Respiratory effort nonlabored on room air. Abd: Soft. Generalized tenderness to palpation. JP drain SS. Midline incision with staples covered with honeycomb dressing. No rebound tenderness or guarding.  MS: Able to move extremities.  Skin: Warm and dry.  Psych: A&Ox3 with an appropriate affect.    Lab Results:  Recent Labs    02/21/24 0835 02/22/24 0832  WBC 9.4 5.9  HGB 8.0* 7.8*  HCT 23.8* 23.6*  PLT 353 328   BMET Recent Labs    02/21/24 0334 02/22/24 0832  NA 138 137  K 3.3* 3.4*  CL 106 104  CO2 24 23  GLUCOSE 97 113*  BUN <5* 6  CREATININE 0.53 0.46  CALCIUM  8.9 8.9   PT/INR No results for input(s): LABPROT, INR in the last 72 hours. CMP     Component Value Date/Time   NA 137 02/22/2024 0832   K 3.4 (L) 02/22/2024 0832   CL 104 02/22/2024 0832   CO2 23 02/22/2024 0832   GLUCOSE 113 (H) 02/22/2024 0832   BUN 6 02/22/2024 0832   CREATININE 0.46 02/22/2024 0832   CALCIUM  8.9 02/22/2024 0832   PROT 6.5 02/18/2024 0822   ALBUMIN 2.6 (L) 02/18/2024 0822   AST 10 (L) 02/18/2024 0822   ALT 8 02/18/2024 0822    ALKPHOS 61 02/18/2024 0822   BILITOT 0.5 02/18/2024 0822   GFRNONAA >60 02/22/2024 0832   GFRAA 81 (L) 02/11/2014 2225   Lipase     Component Value Date/Time   LIPASE 24 11/05/2022 0356       Studies/Results: No results found.  Anti-infectives: Anti-infectives (From admission, onward)    Start     Dose/Rate Route Frequency Ordered Stop   02/20/24 2359  metroNIDAZOLE  (FLAGYL ) IVPB 500 mg        500 mg 100 mL/hr over 60 Minutes Intravenous 2 times daily 02/20/24 2259     02/19/24 1300  metroNIDAZOLE  (FLAGYL ) IVPB 500 mg  Status:  Discontinued        500 mg 100 mL/hr over 60 Minutes Intravenous Every 12 hours 02/19/24 1156 02/20/24 2259   02/19/24 1230  cefTRIAXone  (ROCEPHIN ) 2 g in sodium chloride  0.9 % 100 mL IVPB       Note to Pharmacy: Pharmacy may adjust dosing strength / duration / interval for maximal efficacy   2 g 200 mL/hr over 30 Minutes Intravenous Every 24 hours 02/19/24 1156          Assessment/Plan POD 4 s/p diagnostic laparoscopy, exploratory laparotomy, right colectomy 12/15 Dr. Ebbie for perf appendicitis - Afebrile, vitals WNL. - No BM/flatulence since surgery. Some nausea  but no vomiting. Tolerating CLD. - Continue CLD and await return of bowel function. Add dulcolax suppository and daily miralax  - Drain output not documented; SS; Continue blake drain, plan to remove prior to D/C - IS/mobilize    Pain: 1,000 mg tylenol  q 6h, flexeril  5 mg TID, oxycodone  5-10 mg q 4h PRN, 0.5 mg dilaudid  q 2h PRN breakthrough FEN: CLD, BMP in AM, has been hypokalemic (3.4); may need to consider TPN soon ID: Rocephin /Flagyl  12/15 >> plan for total of 5 days  VTE: SCD's, Lovenox  Dispo:  med-surg unit     LOS: 3 days   I reviewed consulting provider notes, specialist notes, nursing notes, last 24 h vitals and pain scores, last 48 h intake and output, last 24 h labs and trends, and last 24 h imaging results.   Almarie GORMAN Pringle, PA-C  [facility] 02/23/2024, 11:35 AM Please see Amion for pager number during day hours 7:00am-4:30pm  "

## 2024-02-23 NOTE — Progress Notes (Addendum)
 RROB completing q shift doppler.  FHR is from 149-165bpm.  Audible fetal movement noted.  Pt has no OB complaints at this time.  She is, however, tearful this morning.  When questioned about this she says that she is just frustrated.  She still hasn't been able to have a bowel movement.  She reports that the plan for today is to give her some sort of laxative to assist her.  Dr Raymond made aware.  No further orders given at this time

## 2024-02-24 LAB — BASIC METABOLIC PANEL WITH GFR
Anion gap: 15 (ref 5–15)
BUN: <5 mg/dL — ABNORMAL LOW (ref 6–20)
CO2: 13 mmol/L — ABNORMAL LOW (ref 22–32)
Calcium: 9.1 mg/dL (ref 8.9–10.3)
Chloride: 104 mmol/L (ref 98–111)
Creatinine, Ser: 0.42 mg/dL — ABNORMAL LOW (ref 0.44–1.00)
GFR, Estimated: >60 mL/min
Glucose, Bld: 79 mg/dL (ref 70–99)
Potassium: 3.9 mmol/L (ref 3.5–5.1)
Sodium: 132 mmol/L — ABNORMAL LOW (ref 135–145)

## 2024-02-24 LAB — MAGNESIUM: Magnesium: 1.6 mg/dL — ABNORMAL LOW (ref 1.7–2.4)

## 2024-02-24 MED ORDER — LACTATED RINGERS IV BOLUS
1000.0000 mL | Freq: Once | INTRAVENOUS | Status: AC
  Administered 2024-02-24: 1000 mL via INTRAVENOUS

## 2024-02-24 MED ORDER — MAGNESIUM SULFATE 2 GM/50ML IV SOLN
2.0000 g | Freq: Once | INTRAVENOUS | Status: AC
  Administered 2024-02-24: 2 g via INTRAVENOUS
  Filled 2024-02-24: qty 50

## 2024-02-24 NOTE — Progress Notes (Signed)
 Completing q shift doppler. Pt has no OB complaints at this time. FHR 142-151bpm via doppler. Pt reports still unable to have a bowel movement, but is hopeful since she's been able to have flatulence. Pt in much higher spirits this morning.

## 2024-02-24 NOTE — Progress Notes (Signed)
 FHR 155 BPM by doppler. Much fetal movement. No vaginal bleeding or leaking of fluid. Pt denies feeling abd cramping.

## 2024-02-24 NOTE — Progress Notes (Signed)
 "  Progress Note  5 Days Post-Op  Subjective: Passing flatus. Had some nausea yesterday morning which has resolved. Tolerating clear liquids.    Objective: Vital signs in last 24 hours: Temp:  [97.3 F (36.3 C)-98.7 F (37.1 C)] 97.3 F (36.3 C) (12/20 1100) Pulse Rate:  [84-102] 91 (12/20 1100) Resp:  [15-17] 15 (12/20 0412) BP: (110-117)/(67-74) 110/74 (12/20 1100) SpO2:  [100 %] 100 % (12/20 1100) Last BM Date : 02/17/24  Intake/Output from previous day: 12/19 0701 - 12/20 0700 In: 480 [P.O.:480] Out: 70 [Drains:70] Intake/Output this shift: No intake/output data recorded.  PE: General: sitting up in chair, NAD Lungs: Respiratory effort nonlabored on room air. Abd: Soft, gravid. JP drain SS. Midline incision with staples covered with honeycomb dressing. No rebound tenderness or guarding.  Skin: Warm and dry.  Psych: A&Ox3 with an appropriate affect.    Lab Results:  Recent Labs    02/22/24 0832  WBC 5.9  HGB 7.8*  HCT 23.6*  PLT 328   BMET Recent Labs    02/22/24 0832  NA 137  K 3.4*  CL 104  CO2 23  GLUCOSE 113*  BUN 6  CREATININE 0.46  CALCIUM  8.9   PT/INR No results for input(s): LABPROT, INR in the last 72 hours. CMP     Component Value Date/Time   NA 137 02/22/2024 0832   K 3.4 (L) 02/22/2024 0832   CL 104 02/22/2024 0832   CO2 23 02/22/2024 0832   GLUCOSE 113 (H) 02/22/2024 0832   BUN 6 02/22/2024 0832   CREATININE 0.46 02/22/2024 0832   CALCIUM  8.9 02/22/2024 0832   PROT 6.5 02/18/2024 0822   ALBUMIN 2.6 (L) 02/18/2024 0822   AST 10 (L) 02/18/2024 0822   ALT 8 02/18/2024 0822   ALKPHOS 61 02/18/2024 0822   BILITOT 0.5 02/18/2024 0822   GFRNONAA >60 02/22/2024 0832   GFRAA 81 (L) 02/11/2014 2225   Lipase     Component Value Date/Time   LIPASE 24 11/05/2022 0356       Studies/Results: No results found.  Anti-infectives: Anti-infectives (From admission, onward)    Start     Dose/Rate Route Frequency Ordered Stop    02/20/24 2359  metroNIDAZOLE  (FLAGYL ) IVPB 500 mg        500 mg 100 mL/hr over 60 Minutes Intravenous 2 times daily 02/20/24 2259     02/19/24 1300  metroNIDAZOLE  (FLAGYL ) IVPB 500 mg  Status:  Discontinued        500 mg 100 mL/hr over 60 Minutes Intravenous Every 12 hours 02/19/24 1156 02/20/24 2259   02/19/24 1230  cefTRIAXone  (ROCEPHIN ) 2 g in sodium chloride  0.9 % 100 mL IVPB       Note to Pharmacy: Pharmacy may adjust dosing strength / duration / interval for maximal efficacy   2 g 200 mL/hr over 30 Minutes Intravenous Every 24 hours 02/19/24 1156          Assessment/Plan POD 5 s/p diagnostic laparoscopy, exploratory laparotomy, right colectomy 12/15 Dr. Ebbie for perf appendicitis - Afebrile, vitals WNL. - Passing flatus - Advance to full liquid diet - Continue blake drain, remains serosanguinous, plan to remove prior to D/C - IS/mobilize  - Hypokalemic yesterday, BMP pending this morning   Pain: 1,000 mg tylenol  q 6h, flexeril  5 mg TID, oxycodone  5-10 mg q 4h PRN, 0.5 mg dilaudid  q 2h PRN breakthrough FEN: Advance to full liquid diet ID: Rocephin /Flagyl  12/15 >> plan for total of 5 days  VTE:  SCD's, Lovenox  Dispo:  med-surg unit     LOS: 4 days   I reviewed consulting provider notes, specialist notes, nursing notes, last 24 h vitals and pain scores, last 48 h intake and output, last 24 h labs and trends, and last 24 h imaging results.   Leonor LITTIE Dawn, MD  [facility] 02/24/2024, 11:23 AM Please see Amion for pager number during day hours 7:00am-4:30pm  "

## 2024-02-24 NOTE — Plan of Care (Signed)

## 2024-02-25 LAB — BASIC METABOLIC PANEL WITH GFR
Anion gap: 11 (ref 5–15)
BUN: <5 mg/dL — ABNORMAL LOW (ref 6–20)
CO2: 20 mmol/L — ABNORMAL LOW (ref 22–32)
Calcium: 8.6 mg/dL — ABNORMAL LOW (ref 8.9–10.3)
Chloride: 105 mmol/L (ref 98–111)
Creatinine, Ser: 0.4 mg/dL — ABNORMAL LOW (ref 0.44–1.00)
GFR, Estimated: >60 mL/min
Glucose, Bld: 81 mg/dL (ref 70–99)
Potassium: 3.5 mmol/L (ref 3.5–5.1)
Sodium: 135 mmol/L (ref 135–145)

## 2024-02-25 LAB — CBC
HCT: 22.6 % — ABNORMAL LOW (ref 36.0–46.0)
Hemoglobin: 7.7 g/dL — ABNORMAL LOW (ref 12.0–15.0)
MCH: 29.2 pg (ref 26.0–34.0)
MCHC: 34.1 g/dL (ref 30.0–36.0)
MCV: 85.6 fL (ref 80.0–100.0)
Platelets: 432 K/uL — ABNORMAL HIGH (ref 150–400)
RBC: 2.64 MIL/uL — ABNORMAL LOW (ref 3.87–5.11)
RDW: 13.4 % (ref 11.5–15.5)
WBC: 5.4 K/uL (ref 4.0–10.5)
nRBC: 0 % (ref 0.0–0.2)

## 2024-02-25 NOTE — Progress Notes (Signed)
 "  Progress Note  6 Days Post-Op  Subjective: Had a bowel movement last night. Has some pain around drain site in RUQ but otherwise feels well. Tearful this morning, overwhelmed with recent events. Tolerating liquids, denies nausea/vomiting. Bicarb was 13 on labs yesterday, IVF bolus given. Bicarb up to 20 today.   Objective: Vital signs in last 24 hours: Temp:  [97.5 F (36.4 C)-98.6 F (37 C)] 97.5 F (36.4 C) (12/21 1052) Pulse Rate:  [76-88] 88 (12/21 1052) Resp:  [15-17] 15 (12/21 1052) BP: (113-118)/(72-85) 114/74 (12/21 1052) SpO2:  [100 %] 100 % (12/21 1052) Last BM Date : 02/24/24  Intake/Output from previous day: 12/20 0701 - 12/21 0700 In: 419.8 [P.O.:120; IV Piggyback:299.8] Out: 80 [Drains:80] Intake/Output this shift: No intake/output data recorded.  PE: General: resting in bed, NAD Lungs: Respiratory effort nonlabored on room air. Abd: Soft, gravid. JP drain serous. Midline incision with staples covered with honeycomb dressing. No rebound tenderness or guarding.  Skin: Warm and dry.    Lab Results:  Recent Labs    02/25/24 0453  WBC 5.4  HGB 7.7*  HCT 22.6*  PLT 432*   BMET Recent Labs    02/24/24 1142 02/25/24 0453  NA 132* 135  K 3.9 3.5  CL 104 105  CO2 13* 20*  GLUCOSE 79 81  BUN <5* <5*  CREATININE 0.42* 0.40*  CALCIUM  9.1 8.6*   PT/INR No results for input(s): LABPROT, INR in the last 72 hours. CMP     Component Value Date/Time   NA 135 02/25/2024 0453   K 3.5 02/25/2024 0453   CL 105 02/25/2024 0453   CO2 20 (L) 02/25/2024 0453   GLUCOSE 81 02/25/2024 0453   BUN <5 (L) 02/25/2024 0453   CREATININE 0.40 (L) 02/25/2024 0453   CALCIUM  8.6 (L) 02/25/2024 0453   PROT 6.5 02/18/2024 0822   ALBUMIN 2.6 (L) 02/18/2024 0822   AST 10 (L) 02/18/2024 0822   ALT 8 02/18/2024 0822   ALKPHOS 61 02/18/2024 0822   BILITOT 0.5 02/18/2024 0822   GFRNONAA >60 02/25/2024 0453   GFRAA 81 (L) 02/11/2014 2225   Lipase     Component  Value Date/Time   LIPASE 24 11/05/2022 0356       Studies/Results: No results found.  Anti-infectives: Anti-infectives (From admission, onward)    Start     Dose/Rate Route Frequency Ordered Stop   02/20/24 2359  metroNIDAZOLE  (FLAGYL ) IVPB 500 mg        500 mg 100 mL/hr over 60 Minutes Intravenous 2 times daily 02/20/24 2259     02/19/24 1300  metroNIDAZOLE  (FLAGYL ) IVPB 500 mg  Status:  Discontinued        500 mg 100 mL/hr over 60 Minutes Intravenous Every 12 hours 02/19/24 1156 02/20/24 2259   02/19/24 1230  cefTRIAXone  (ROCEPHIN ) 2 g in sodium chloride  0.9 % 100 mL IVPB       Note to Pharmacy: Pharmacy may adjust dosing strength / duration / interval for maximal efficacy   2 g 200 mL/hr over 30 Minutes Intravenous Every 24 hours 02/19/24 1156          Assessment/Plan POD 6 s/p diagnostic laparoscopy, exploratory laparotomy, right colectomy 12/15 Dr. Ebbie for perf appendicitis - Afebrile, vitals WNL. - Having bowel function. Advance to soft diet. - Keep JP drain in place today, if remains serous tomorrow, plan to remove. - IS/mobilize  - Hypokalemia resolved - Pregnancy (21weeks) - getting fetal heart tones checked every shift  Pain: 1,000 mg tylenol  q 6h, flexeril  5 mg TID, oxycodone  5-10 mg q 4h PRN, 0.5 mg dilaudid  q 2h PRN breakthrough FEN: Soft diet ID: Has completed 6 days antibiotics postop, will discontinue. WBC normal, afebrile. VTE: SCD's, Lovenox  Dispo:  med-surg unit     LOS: 5 days   I reviewed consulting provider notes, specialist notes, nursing notes, last 24 h vitals and pain scores, last 48 h intake and output, last 24 h labs and trends, and last 24 h imaging results.   Leonor LITTIE Dawn, MD  [facility] 02/25/2024, 11:45 AM Please see Amion for pager number during day hours 7:00am-4:30pm  "

## 2024-02-25 NOTE — Plan of Care (Signed)

## 2024-02-25 NOTE — Progress Notes (Signed)
 FHR 160 BPM by doppler. Pt reports fetal movement. No vaginal bleeding or leaking of fluid.

## 2024-02-25 NOTE — Progress Notes (Signed)
 FHR 153-158 via doppler.  +FM heard and patient verified FM.  No obstetrical complaints.

## 2024-02-26 MED ORDER — OXYCODONE HCL 5 MG PO TABS: 5.0000 mg | ORAL_TABLET | ORAL | 0 refills | Status: AC | PRN

## 2024-02-26 MED ORDER — POLYETHYLENE GLYCOL 3350 17 G PO PACK: 17.0000 g | PACK | Freq: Every day | ORAL | Status: AC | PRN

## 2024-02-26 MED ORDER — DOCUSATE SODIUM 100 MG PO CAPS: 100.0000 mg | ORAL_CAPSULE | Freq: Two times a day (BID) | ORAL | 0 refills | Status: AC

## 2024-02-26 NOTE — TOC Transition Note (Signed)
 Transition of Care [facility]) - Discharge Note   Patient Details  Name: Christy Gonzalez MRN: [id number] Date of Birth: 24-Apr-1995  [facility] CM/SW Contact:  Sudie Erminio Deems, RN Phone Number: 02/26/2024, 2:02 PM   Clinical Narrative:  Patient plans to discharge today. PCP appointment established and information is on the AVS. No further home needs identified at this time.   Final next level of care: Home/Self Care Barriers to Discharge: No Barriers Identified   Patient Goals and CMS Choice Patient states their goals for this hospitalization and ongoing recovery are:: to return to home   Choice offered to / list presented to : NA  Discharge Plan and Services Additional resources added to the After Visit Summary for   In-house Referral: NA Discharge Planning Services: Follow-up appt scheduled, CM Consult Post Acute Care Choice: NA          DME Arranged: N/A DME Agency: NA       HH Arranged: NA HH Agency: NA  Social Drivers of Health (SDOH) Interventions SDOH Screenings   Food Insecurity: No Food Insecurity (02/19/2024)  Housing: Low Risk (02/19/2024)  Transportation Needs: No Transportation Needs (02/19/2024)  Utilities: Not At Risk (02/19/2024)  Tobacco Use: Medium Risk (02/19/2024)     Readmission Risk Interventions     No data to display

## 2024-02-26 NOTE — Discharge Instructions (Signed)
" ° °  [facility] DISCHARGE INSTRUCTIONS  Activity No heavy lifting greater than 10 pounds for 8 weeks after surgery. Ok to shower, but do not bathe or submerge incisions underwater. Do not drive while taking narcotic pain medication. You may drive when you are no longer taking prescription pain medication, you can comfortably wear a seatbelt, and you can safely maneuver your car and apply brakes.  Wound Care Your incisions are closed with staples. These will be removed at your first office visit about 2 weeks after surgery.  You may shower and allow warm soapy water to run over your incisions. Gently pat dry. Do not submerge your incision underwater until cleared by your surgeon. Monitor your incision for any new redness, tenderness, or drainage. Many patients will experience some swelling and bruising at the incisions.  Ice packs will help.  Swelling and bruising can take several days to resolve.   Medications A  prescription for pain medication may be given to you upon discharge.  Take your pain medication as prescribed, if needed.  If narcotic pain medicine is not needed, then you may take acetaminophen  (Tylenol ). It is common to experience some constipation if taking pain medication after surgery.  Increasing fluid intake and taking a stool softener (such as Colace) will usually help or prevent this problem from occurring.  A mild laxative (Milk of Magnesia or Miralax ) should be taken according to package directions if there are no bowel movements after 48 hours. Take your usually prescribed medications unless otherwise directed. If you need a refill on your pain medication, please contact your pharmacy.  They will contact our office to request authorization. Prescriptions will not be filled after 5 pm or on weekends.  When to Call Us : Fever greater than 100.5 New redness, drainage, or swelling at incision site Severe pain, nausea, or vomiting Persistent bleeding from  incisions Jaundice (yellowing of the whites of the eyes or skin)  Follow-up - You have an appointment to have your staples removed on Monday, March 04, 2024 at 9:00am. Please arrive 30 minutes prior to your appointment time to complete paperwork. - You have an appointment with your surgeon, Dr. Ebbie, on March 22, 2024 at 9:00am.  - Both of these appointments will be at the [facility] office at [address], [place] [address].    IF YOU HAVE DISABILITY OR FAMILY LEAVE FORMS, YOU MUST BRING THEM TO THE OFFICE FOR PROCESSING.   DO NOT GIVE THEM TO YOUR DOCTOR.  The clinic staff is available to answer your questions during regular business hours.  Please dont hesitate to call and ask to speak to one of the nurses for clinical concerns.  If you have a medical emergency, go to the nearest emergency room or call 911.  A surgeon from [facility] is always on call at the hospital  [address], [place], [place]  [address] ?  [address] [address], [place], [place]   [address] [phone] ? Toll Free: [phone] ? FAX [phone] Web site: [email or web address]     "

## 2024-02-26 NOTE — Discharge Summary (Signed)
 Physician Discharge Summary   Patient ID: Christy Gonzalez [id number] 28 y.o. [birth year]  Admit date: 02/19/2024  Discharge date and time: 02/26/2024  2:08 PM   Admitting Physician: Ovid All, MD   Discharge Physician: Leonor Dawn, MD  Admission Diagnoses: Acute perforated appendicitis  Discharge Diagnoses: Same  Admission Condition: fair  Discharged Condition: stable  Indication for Admission: Christy Gonzalez is a 28 yo female who presented at [redacted]w[redacted]d gestation with right sided abdominal pain for two weeks. Imaging workup showed an abscess in the RLQ, consistent with perforated appendicitis.  Hospital Course: GYN and surgery were consulted and it was felt the patient had acute appendicitis and not a TOA. The patient was taken to the OR by Dr. Ebbie on 12/15. At the time of laparoscopy she had a large abscess cavity involving the terminal ileum and cecum. A laparotomy and ileocecectomy were performed. Please see separated operative report for further details. She was continued on IV antibiotics through POD5. She was started on a clear liquid diet and had a mild postoperative ileus, which resolved with supportive care. By POD5 she began passing flatus. She was advanced to full liquids and then a soft diet, which she tolerated without vomiting. She began having bowel movements. Fetal heart tones were monitored every shift throughout her hospital course. On the morning of POD7, she was tolerating solid food, pain was controlled, she was having regular bowel function, and she was ambulatory. She was examined and deemed appropriate for discharge home. Her surgical drain remained serosanguinous and was removed prior to discharge. She will have outpatient follow up with OB later in the week. She will follow up in 1 week for staple removal from her incision.   Consults: OB/GYN  Significant Diagnostic Studies:  Surgical Pathology: A. 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 Midline incision clean and dry with staples in place. Drain site clean and dry.  Extremities: warm and well-perfused   Disposition: Discharge disposition: 01-Home or Self Care       Patient Instructions:  Allergies as of 02/26/2024   No Known Allergies      Medication List     TAKE these medications    acetaminophen  500 MG tablet Commonly known as: TYLENOL  Take 500 mg by mouth every 6 (six) hours as needed for mild pain (pain score 1-3).   cyclobenzaprine  5 MG tablet Commonly known as: FLEXERIL  Take 1 tablet (5 mg total) by mouth 3 (three) times daily as needed. What changed: reasons to take this   docusate sodium  100 MG capsule Commonly known as: COLACE Take 1 capsule (100 mg total) by mouth 2 (two) times daily.   multivitamin-prenatal 27-0.8 MG Tabs tablet Take 1 tablet by mouth daily at 12 noon.   oxyCODONE  5 MG immediate release tablet Commonly known as: Oxy IR/ROXICODONE  Take 1 tablet (5 mg total) by mouth every 4 (four) hours as needed for severe pain (pain score 7-10).   polyethylene glycol 17 g packet Commonly known as: MIRALAX  / GLYCOLAX  Take 17 g by mouth daily as needed for mild constipation.   promethazine  25 MG  tablet Commonly known as: PHENERGAN  Take 1 tablet (25 mg total) by mouth every 6 (six) hours as needed for nausea or vomiting.       Activity: no driving while on analgesics and no heavy lifting for 8 weeks Diet: Soft low fiber diet Wound Care: keep wound  clean and dry and ok to shower  Follow-up at [facility] for staple removal on December 29. Follow up with Dr. Ebbie on March 22, 2024.   Signed: Leonor LITTIE Dawn 02/26/2024 5:25 PM

## 2024-02-26 NOTE — Progress Notes (Signed)
 Pt with some soreness No contractions or LOF BP 111/77 (BP Location: Left Arm)   Pulse 84   Temp 98.1 F (36.7 C)   Resp 18   LMP 09/27/2023   SpO2 100%   Pt will follow up with us  this week PTL precautions given She would like a belly band

## 2024-04-04 ENCOUNTER — Ambulatory Visit

## 2024-04-04 ENCOUNTER — Ambulatory Visit: Attending: Obstetrics

## 2024-04-24 ENCOUNTER — Ambulatory Visit
